# Patient Record
Sex: Male | Born: 1940 | Race: White | Hispanic: No | Marital: Married | Smoking: Never smoker
Health system: Southern US, Community
[De-identification: ages and names within clinical notes are randomized; demographics above are authoritative.]

## PROBLEM LIST (undated history)

## (undated) DIAGNOSIS — I5189 Other ill-defined heart diseases: Secondary | ICD-10-CM

## (undated) DIAGNOSIS — E785 Hyperlipidemia, unspecified: Secondary | ICD-10-CM

## (undated) DIAGNOSIS — I639 Cerebral infarction, unspecified: Secondary | ICD-10-CM

## (undated) DIAGNOSIS — R7303 Prediabetes: Secondary | ICD-10-CM

## (undated) DIAGNOSIS — I10 Essential (primary) hypertension: Secondary | ICD-10-CM

## (undated) HISTORY — PX: JOINT REPLACEMENT: SHX530

## (undated) HISTORY — DX: Hyperlipidemia, unspecified: E78.5

---

## 2010-07-27 ENCOUNTER — Ambulatory Visit: Payer: Self-pay | Admitting: Ophthalmology

## 2010-08-15 ENCOUNTER — Ambulatory Visit: Payer: Self-pay | Admitting: Ophthalmology

## 2010-10-14 ENCOUNTER — Ambulatory Visit: Payer: Self-pay | Admitting: Unknown Physician Specialty

## 2014-12-01 ENCOUNTER — Encounter: Payer: Self-pay | Admitting: Podiatry

## 2014-12-01 ENCOUNTER — Ambulatory Visit (INDEPENDENT_AMBULATORY_CARE_PROVIDER_SITE_OTHER): Payer: Medicare Other | Admitting: Podiatry

## 2014-12-01 VITALS — BP 148/78 | HR 49 | Resp 18

## 2014-12-01 DIAGNOSIS — L603 Nail dystrophy: Secondary | ICD-10-CM

## 2014-12-01 NOTE — Progress Notes (Signed)
   Subjective:    Patient ID: Alexander Meadowaymond E Eisenberger Jr., male    DOB: 01/16/1941, 74 y.o.   MRN: [id number]  HPI  74 year old male presents the office today with question his nails to be looked at to help prevent ingrowing toenail. He states his nails have started to grow a different shape any is concerned that he may start to get an ingrown toenail. He has a states that he is started to notice contractures of his toes. He states that his entire life he has had to wear a metatarsal pad and an orthotic. He denies any recent injury or trauma to bilateral lower extremity's. No other complaints at this time.  Review of Systems  All other systems reviewed and are negative.      Objective:   Physical Exam AAO 3, NAD DP/PT pulses palpable bilaterally, CRT less than 3 seconds Protective sensation intact with Simms Weinstein monofilament, vibratory sensation intact, Achilles tendon reflex intact. Nails are slightly hypertrophic, dystrophic, discolored. There is no swelling erythema or drainage. There is no significant ingrowing present at this time. There is no tenderness to palpation around the nails. No open lesions or pre-ulcerative lesions are identified. No areas of tenderness to bilateral lower extremities and no overlying edema, erythema, increase in warmth. There is hammertoe contractures identified. Prominent metatarsal heads plantarly with atrophy of the fat pad bilaterally. MMT 5/5, ROM WNL No pain with calf compression, swelling, warmth, erythema.       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 Patient does not believe that he has any nail fungus and he does not want to proceed with any biopsy at this time.  Discussed with him how to trim his nails appropriately to help prevent ingrown toenail. Also discussed shoe gear modifications. -In regards to the hammertoes dispensed various offloading devices to help. Discussed shoe gear changes. -Follow-up as needed. In the meantime, encouraged to call the office with any questions, concerns, changes symptoms.

## 2017-09-28 ENCOUNTER — Emergency Department: Payer: Medicare Other

## 2017-09-28 ENCOUNTER — Other Ambulatory Visit: Payer: Self-pay

## 2017-09-28 ENCOUNTER — Encounter: Payer: Self-pay | Admitting: Internal Medicine

## 2017-09-28 ENCOUNTER — Inpatient Hospital Stay
Admission: EM | Admit: 2017-09-28 | Discharge: 2017-10-02 | DRG: 470 | Disposition: A | Discharge status: Home Health | Payer: Medicare Other | Attending: Internal Medicine | Admitting: Internal Medicine

## 2017-09-28 DIAGNOSIS — R0902 Hypoxemia: Secondary | ICD-10-CM

## 2017-09-28 DIAGNOSIS — R7303 Prediabetes: Secondary | ICD-10-CM | POA: Diagnosis present

## 2017-09-28 DIAGNOSIS — E785 Hyperlipidemia, unspecified: Secondary | ICD-10-CM | POA: Diagnosis present

## 2017-09-28 DIAGNOSIS — W19XXXA Unspecified fall, initial encounter: Secondary | ICD-10-CM | POA: Diagnosis present

## 2017-09-28 DIAGNOSIS — Z882 Allergy status to sulfonamides status: Secondary | ICD-10-CM

## 2017-09-28 DIAGNOSIS — R739 Hyperglycemia, unspecified: Secondary | ICD-10-CM | POA: Diagnosis present

## 2017-09-28 DIAGNOSIS — S72011A Unspecified intracapsular fracture of right femur, initial encounter for closed fracture: Principal | ICD-10-CM | POA: Diagnosis present

## 2017-09-28 DIAGNOSIS — S72009A Fracture of unspecified part of neck of unspecified femur, initial encounter for closed fracture: Secondary | ICD-10-CM

## 2017-09-28 DIAGNOSIS — J9811 Atelectasis: Secondary | ICD-10-CM | POA: Diagnosis not present

## 2017-09-28 DIAGNOSIS — S61412A Laceration without foreign body of left hand, initial encounter: Secondary | ICD-10-CM | POA: Diagnosis present

## 2017-09-28 DIAGNOSIS — Y92008 Other place in unspecified non-institutional (private) residence as the place of occurrence of the external cause: Secondary | ICD-10-CM | POA: Diagnosis not present

## 2017-09-28 DIAGNOSIS — Z7982 Long term (current) use of aspirin: Secondary | ICD-10-CM

## 2017-09-28 DIAGNOSIS — I10 Essential (primary) hypertension: Secondary | ICD-10-CM | POA: Diagnosis present

## 2017-09-28 DIAGNOSIS — Z7984 Long term (current) use of oral hypoglycemic drugs: Secondary | ICD-10-CM

## 2017-09-28 DIAGNOSIS — Z96649 Presence of unspecified artificial hip joint: Secondary | ICD-10-CM

## 2017-09-28 DIAGNOSIS — R509 Fever, unspecified: Secondary | ICD-10-CM

## 2017-09-28 DIAGNOSIS — S72001A Fracture of unspecified part of neck of right femur, initial encounter for closed fracture: Secondary | ICD-10-CM

## 2017-09-28 DIAGNOSIS — D62 Acute posthemorrhagic anemia: Secondary | ICD-10-CM | POA: Diagnosis not present

## 2017-09-28 HISTORY — DX: Prediabetes: R73.03

## 2017-09-28 LAB — BASIC METABOLIC PANEL
Anion gap: 10 (ref 5–15)
BUN: 19 mg/dL (ref 6–20)
CALCIUM: 8.9 mg/dL (ref 8.9–10.3)
CHLORIDE: 107 mmol/L (ref 101–111)
CO2: 23 mmol/L (ref 22–32)
Creatinine, Ser: 1.27 mg/dL — ABNORMAL HIGH (ref 0.61–1.24)
GFR calc non Af Amer: 53 mL/min — ABNORMAL LOW (ref >60)
Glucose, Bld: 117 mg/dL — ABNORMAL HIGH (ref 65–99)
Potassium: 3.6 mmol/L (ref 3.5–5.1)
Sodium: 140 mmol/L (ref 135–145)

## 2017-09-28 LAB — PROTIME-INR
INR: 1.06
PROTHROMBIN TIME: 13.7 s (ref 11.4–15.2)

## 2017-09-28 LAB — GLUCOSE, CAPILLARY
GLUCOSE-CAPILLARY: 103 mg/dL — AB (ref 65–99)
Glucose-Capillary: 81 mg/dL (ref 65–99)

## 2017-09-28 LAB — SURGICAL PCR SCREEN
MRSA, PCR: NEGATIVE
STAPHYLOCOCCUS AUREUS: POSITIVE — AB

## 2017-09-28 LAB — CBC
HCT: 37.1 % — ABNORMAL LOW (ref 40.0–52.0)
Hemoglobin: 12.4 g/dL — ABNORMAL LOW (ref 13.0–18.0)
MCH: 30.9 pg (ref 26.0–34.0)
MCHC: 33.5 g/dL (ref 32.0–36.0)
MCV: 92.1 fL (ref 80.0–100.0)
PLATELETS: 200 10*3/uL (ref 150–440)
RBC: 4.03 MIL/uL — ABNORMAL LOW (ref 4.40–5.90)
RDW: 14.9 % — ABNORMAL HIGH (ref 11.5–14.5)
WBC: 10.3 10*3/uL (ref 3.8–10.6)

## 2017-09-28 MED ORDER — ONDANSETRON HCL 4 MG/2ML IJ SOLN
4.0000 mg | Freq: Once | INTRAMUSCULAR | Status: AC
  Administered 2017-09-28: 4 mg via INTRAVENOUS
  Filled 2017-09-28 (×2): qty 2

## 2017-09-28 MED ORDER — ATORVASTATIN CALCIUM 20 MG PO TABS
40.0000 mg | ORAL_TABLET | Freq: Every day | ORAL | Status: DC
  Administered 2017-09-29 – 2017-10-01 (×3): 40 mg via ORAL
  Filled 2017-09-28 (×4): qty 2

## 2017-09-28 MED ORDER — SODIUM CHLORIDE 0.9 % IV SOLN
INTRAVENOUS | Status: DC
  Administered 2017-09-28 – 2017-10-01 (×4): via INTRAVENOUS

## 2017-09-28 MED ORDER — ASPIRIN EC 81 MG PO TBEC: 81.0000 mg | DELAYED_RELEASE_TABLET | Freq: Two times a day (BID) | ORAL | Status: DC

## 2017-09-28 MED ORDER — MORPHINE SULFATE (PF) 2 MG/ML IV SOLN
2.0000 mg | INTRAVENOUS | Status: DC | PRN
  Administered 2017-09-29: 2 mg via INTRAVENOUS
  Filled 2017-09-28: qty 1

## 2017-09-28 MED ORDER — DOCUSATE SODIUM 100 MG PO CAPS: 100.0000 mg | ORAL_CAPSULE | Freq: Two times a day (BID) | ORAL | Status: DC | PRN

## 2017-09-28 MED ORDER — MORPHINE SULFATE (PF) 2 MG/ML IV SOLN
2.0000 mg | Freq: Once | INTRAVENOUS | Status: AC
  Administered 2017-09-28: 2 mg via INTRAVENOUS
  Filled 2017-09-28 (×2): qty 1

## 2017-09-28 MED ORDER — HEPARIN SODIUM (PORCINE) 5000 UNIT/ML IJ SOLN
5000.0000 [IU] | Freq: Three times a day (TID) | INTRAMUSCULAR | Status: DC
  Filled 2017-09-28: qty 1

## 2017-09-28 MED ORDER — CLINDAMYCIN PHOSPHATE 600 MG/50ML IV SOLN
600.0000 mg | INTRAVENOUS | Status: AC
  Administered 2017-09-29: 600 mg via INTRAVENOUS
  Filled 2017-09-28: qty 50

## 2017-09-28 MED ORDER — ACETAMINOPHEN 325 MG PO TABS
650.0000 mg | ORAL_TABLET | Freq: Four times a day (QID) | ORAL | Status: DC | PRN
  Administered 2017-09-28 – 2017-09-29 (×2): 650 mg via ORAL
  Filled 2017-09-28 (×2): qty 2

## 2017-09-28 MED ORDER — INSULIN ASPART 100 UNIT/ML ~~LOC~~ SOLN
0.0000 [IU] | Freq: Three times a day (TID) | SUBCUTANEOUS | Status: DC
  Administered 2017-09-29 – 2017-10-02 (×7): 1 [IU] via SUBCUTANEOUS
  Filled 2017-09-28 (×7): qty 1

## 2017-09-28 MED ORDER — CEFAZOLIN SODIUM-DEXTROSE 2-4 GM/100ML-% IV SOLN
2.0000 g | INTRAVENOUS | Status: DC
  Filled 2017-09-28 (×2): qty 100

## 2017-09-28 NOTE — ED Provider Notes (Signed)
[facility] Emergency Department Provider Note   ____________________________________________   First MD Initiated Contact with Patient 09/28/17 1302     (approximate)  I have reviewed the triage vital signs and the nursing notes.   HISTORY  Chief Complaint Fall    HPI Alexander MeadowRaymond E Crossman Jr. is a 76 y.o. male comes for evaluation of right hip pain after a fall  Patient reports that he was lifting his garage door when the cord on the garage door got stuck under his feet.  He caught him up and he fell directly onto the concrete floor with his right hip.  Reports he did not strike his head or injure his neck back chest or arms except for a small cut noted on the left hand.  He reports he was unable to get up due to pain in his right hip.  EMS was called and the noted significant tenderness around the right hip brought for further evaluation.  Denies any numbness tingling or weakness but reports he cannot move the right leg because of pain at the right hip  Takes only aspirin, no other blood thinner  No headache nausea vomiting or neck pain  Past Medical History:  Diagnosis Date  . Hyperlipidemia     There are no active problems to display for this patient.   HPI: Hypertension  Prior to Admission medications   Medication Sig Start Date End Date Taking? Authorizing Provider  aspirin EC 81 MG tablet Take 81 mg by mouth 2 (two) times daily.   Yes [provider]  ATORVASTATIN CALCIUM PO Take 1 tablet by mouth daily.   Yes [provider]  metFORMIN (GLUCOPHAGE) 500 MG tablet Take 500 mg by mouth daily with breakfast.   Yes [provider]    Allergies Sulfa antibiotics  No family history on file.  Social History Social History   Tobacco Use  . Smoking status: Never Smoker  . Smokeless tobacco: Never Used  Substance Use Topics  . Alcohol use: No    Alcohol/week: 0.0 oz  . Drug use: No    Review of  Systems Constitutional: No fever/chills Eyes: No visual changes. ENT: No sore throat. Cardiovascular: Denies chest pain. Respiratory: Denies shortness of breath. Gastrointestinal: No abdominal pain.  No nausea, no vomiting.  No diarrhea.  No constipation. Genitourinary: Negative for dysuria. Musculoskeletal: Negative for back pain.  See HPI regarding right hip pain denies any injury to the arms or left leg Skin: Negative for rash. Neurological: Negative for headaches, focal weakness or numbness.    ____________________________________________   PHYSICAL EXAM:  VITAL SIGNS: ED Triage Vitals  Enc Vitals Group     BP 09/28/17 1254 134/69     Pulse Rate 09/28/17 1254 72     Resp 09/28/17 1254 16     Temp 09/28/17 1254 99.1 F (37.3 C)     Temp Source 09/28/17 1254 Oral     SpO2 09/28/17 1254 97 %     Weight 09/28/17 1255 180 lb (81.6 kg)     Height 09/28/17 1255 5\' 11"  (1.803 m)     Head Circumference --      Peak Flow --      Pain Score 09/28/17 1253 1     Pain Loc --      Pain Edu? --      Excl. in GC? --     Constitutional: Alert and oriented. Well appearing and in no acute distress.  Very pleasant, here  with his daughter. Eyes: Conjunctivae are normal. Head: Atraumatic. Nose: No congestion/rhinnorhea. Mouth/Throat: Mucous membranes are moist. Neck: No stridor.   Cardiovascular: Normal rate, regular rhythm. Grossly normal heart sounds.  Good peripheral circulation. Respiratory: Normal respiratory effort.  No retractions. Lungs CTAB. Gastrointestinal: Soft and nontender. No distention. Musculoskeletal: Full range of motion the upper extremities without discomfort or pain at any joint.  No deformities.  A very small skin tear is noted over the left hand with bleeding controlled. Full range of motion left lower extremity without pain or discomfort.  Right lower extremity exam very limited due to focal tenderness about the right hip.  No tenderness around the right knee,  right knee, right ankle.  Dorsalis pedis pulses intact and normal bilateral.  Strong posterior tibial in the right leg as well. Neurologic:  Normal speech and language. No gross focal neurologic deficits are appreciated.  Skin:  Skin is warm, dry and intact. No rash noted. Psychiatric: Mood and affect are normal. Speech and behavior are normal.  ____________________________________________   LABS (all labs ordered are listed, but only abnormal results are displayed)  Labs Reviewed  CBC - Abnormal; Notable for the following components:      Result Value   RBC 4.03 (*)    Hemoglobin 12.4 (*)    HCT 37.1 (*)    RDW 14.9 (*)    All other components within normal limits  BASIC METABOLIC PANEL - Abnormal; Notable for the following components:   Glucose, Bld 117 (*)    Creatinine, Ser 1.27 (*)    GFR calc non Af Amer 53 (*)    All other components within normal limits  PROTIME-INR   ____________________________________________  EKG   ____________________________________________  RADIOLOGY  Dg Chest 1 View  Result Date: 09/28/2017 CLINICAL DATA:  Status post fall today. EXAM: CHEST 1 VIEW COMPARISON:  None. FINDINGS: Lungs are clear. Heart size is normal. No pneumothorax or pleural effusion. The patient is status post fixation of left scapular and left upper rib fractures. IMPRESSION: No acute abnormality. Electronically Signed   By: Drusilla Kanner M.D.   On: 09/28/2017 13:55   Dg Hip Unilat W Or Wo Pelvis 2-3 Views Right  Result Date: 09/28/2017 CLINICAL DATA:  Right hip pain.  Fall. EXAM: DG HIP (WITH OR WITHOUT PELVIS) 2-3V RIGHT COMPARISON:  No recent. FINDINGS: Angulated fracture of right femoral neck. Degenerative changes lumbar spine and both hips. Pelvic calcifications consistent with phleboliths. IMPRESSION: Angulated fracture of the right femoral neck. Electronically Signed   By: Maisie Fus  Register   On: 09/28/2017 13:53     Chest x-ray reviewed, no acute Right hip  reviewed, angulated fracture right femoral neck ____________________________________________   PROCEDURES  Procedure(s) performed: None  Procedures  Critical Care performed: No  ____________________________________________   INITIAL IMPRESSION / ASSESSMENT AND PLAN / ED COURSE  Pertinent labs & imaging results that were available during my care of the patient were reviewed by me and considered in my medical decision making (see chart for details).  Patient presents with fall.  Denies injury to the head neck chest abdomen pelvis, but does isolate right hip pain.  Alert and oriented.  No indication for head CT or cervical imaging.   Patient has obvious right proximal femur fracture/neck fracture.  ----------------------------------------- 2:43 PM on 09/28/2017 -----------------------------------------  Patient is been seen by Dr. Hyacinth Meeker.  Patient will be admitted to hospital service for ongoing management of right hip fracture.  Updated and discussed plan of admission and  orthopedic evaluation with patient and family who are in agreement.  Reports his pain is currently "1" out of 10, reports comfortable at this time.      ____________________________________________   FINAL CLINICAL IMPRESSION(S) / ED DIAGNOSES  Final diagnoses:  Closed right hip fracture, initial encounter (HCC)  Skin tear of left hand without complication, initial encounter      NEW MEDICATIONS STARTED DURING THIS VISIT:  This SmartLink is deprecated. Use AVSMEDLIST instead to display the medication list for a patient.   Note:  This document was prepared using Dragon voice recognition software and may include unintentional dictation errors.     Sharyn CreamerQuale, Mark, MD 09/28/17 1444

## 2017-09-28 NOTE — Progress Notes (Signed)
Pt arrived to unit. Skin assessment completed with [name], RN. SCDs applied. New IV bag hung and infusing. Pt on room air. MRSA swab completed. Family at bedside. Bed in lowest position and bed alarm on. Pt educated on call Adelsberger. Phone and items within reach. No complaints or questions at this time.

## 2017-09-28 NOTE — Anesthesia Preprocedure Evaluation (Addendum)
Anesthesia Evaluation  Patient identified by MRN, date of birth, ID band Patient awake    Reviewed: Allergy & Precautions, H&P , NPO status , Patient's Chart, lab work & pertinent test results, reviewed documented beta blocker date and time   History of Anesthesia Complications Negative for: history of anesthetic complications  Airway Mallampati: I  TM Distance: >3 FB Neck ROM: full    Dental  (+) Dental Advidsory Given, Caps, Teeth Intact   Pulmonary neg pulmonary ROS,           Cardiovascular Exercise Tolerance: Good negative cardio ROS       Neuro/Psych negative neurological ROS  negative psych ROS   GI/Hepatic negative GI ROS, Neg liver ROS,   Endo/Other  diabetes (Pre-diabetic)  Renal/GU negative Renal ROS  negative genitourinary   Musculoskeletal   Abdominal   Peds  Hematology negative hematology ROS (+)   Anesthesia Other Findings Past Medical History: No date: Hyperlipidemia   Reproductive/Obstetrics negative OB ROS                            Anesthesia Physical Anesthesia Plan  ASA: II  Anesthesia Plan: Spinal   Post-op Pain Management:    Induction: Intravenous  PONV Risk Score and Plan: 1 and Propofol infusion  Airway Management Planned: Simple Face Mask  Additional Equipment:   Intra-op Plan:   Post-operative Plan:   Informed Consent: I have reviewed the patients History and Physical, chart, labs and discussed the procedure including the risks, benefits and alternatives for the proposed anesthesia with the patient or authorized representative who has indicated his/her understanding and acceptance.   Dental Advisory Given  Plan Discussed with: Anesthesiologist, CRNA and Surgeon  Anesthesia Plan Comments:         Anesthesia Quick Evaluation

## 2017-09-28 NOTE — H&P (Signed)
[facility]   PATIENT NAME: Alexander Kline    MR#:  [id number]  DATE OF BIRTH:  05/21/1941  DATE OF ADMISSION:  09/28/2017  PRIMARY CARE PHYSICIAN: Sherron Mondayejan-Sie, S Ahmed, MD   REQUESTING/REFERRING PHYSICIAN: Quale   CHIEF COMPLAINT:   Chief Complaint  Patient presents with  . Fall    HISTORY OF PRESENT ILLNESS: Alexander Kline  is a 76 y.o. 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 Today , while pulling the garage door open- had a string attached to it, and it got tangled in his leg and he fell- his right hip hit the concrete floor. In ER noted to have right hip- femoral neck fracture. Ortho planning for surgery tomorrow.  PAST MEDICAL HISTORY:   Past Medical History:  Diagnosis Date  . Hyperlipidemia     PAST SURGICAL HISTORY: None. SOCIAL HISTORY:  Social History   Tobacco Use  . Smoking status: Never Smoker  . Smokeless tobacco: Never Used  Substance Use Topics  . Alcohol use: No    Alcohol/week: 0.0 oz    FAMILY HISTORY:  Family History  Problem Relation Age of Onset  . CAD Father   . CAD Brother     DRUG ALLERGIES:  Allergies  Allergen Reactions  . Sulfa Antibiotics     Per pt, he gets bad headaches with sulfa drugs    REVIEW OF SYSTEMS:   CONSTITUTIONAL: No fever, fatigue or weakness.  EYES: No blurred or double vision.  EARS, NOSE, AND THROAT: No tinnitus or ear pain.  RESPIRATORY: No cough, shortness of breath, wheezing or hemoptysis.  CARDIOVASCULAR: No chest pain, orthopnea, edema.  GASTROINTESTINAL: No nausea, vomiting, diarrhea or abdominal pain.  GENITOURINARY: No dysuria, hematuria.  ENDOCRINE: No polyuria, nocturia,  HEMATOLOGY: No anemia, easy bruising or bleeding SKIN: No rash or lesion. MUSCULOSKELETAL: right hip pain.   NEUROLOGIC: No tingling, numbness, weakness.  PSYCHIATRY: No  anxiety or depression.   MEDICATIONS AT HOME:  Prior to Admission medications   Medication Sig Start Date End Date Taking? Authorizing Provider  aspirin EC 81 MG tablet Take 81 mg by mouth 2 (two) times daily.   Yes [provider]  ATORVASTATIN CALCIUM PO Take 1 tablet by mouth daily.   Yes [provider]  metFORMIN (GLUCOPHAGE) 500 MG tablet Take 500 mg by mouth daily with breakfast.   Yes [provider]      PHYSICAL EXAMINATION:   VITAL SIGNS: Blood pressure 134/69, pulse 72, temperature 99.1 F (37.3 C), temperature source Oral, resp. rate 16, height 5\' 11"  (1.803 m), weight 81.6 kg (180 lb), SpO2 97 %.  GENERAL:  76 y.o.-year-old patient lying in the bed with no acute distress.  EYES: Pupils equal, round, reactive to light and accommodation. No scleral icterus. Extraocular muscles intact.  HEENT: Head atraumatic, normocephalic. Oropharynx and nasopharynx clear.  NECK:  Supple, no jugular venous distention. No thyroid enlargement, no tenderness.  LUNGS: Normal breath sounds bilaterally, no wheezing, rales,rhonchi or crepitation. No use of accessory muscles of respiration.  CARDIOVASCULAR: S1, S2 normal. No murmurs, rubs, or gallops.  ABDOMEN: Soft, nontender, nondistended. Bowel sounds present. No organomegaly or mass.  EXTREMITIES: No pedal edema, cyanosis, or clubbing. dorsalis pedis pulse is palpable on right. NEUROLOGIC: Cranial nerves II through XII are intact. Muscle strength 5/5 in all extremities- except pain in right LL. Sensation intact.  Gait not checked.  PSYCHIATRIC: The patient is alert and oriented x 3.  SKIN: No obvious rash, lesion, or ulcer.   LABORATORY PANEL:   CBC Recent Labs  Lab 09/28/17 1251  WBC 10.3  HGB 12.4*  HCT 37.1*  PLT 200  MCV 92.1  MCH 30.9  MCHC 33.5  RDW 14.9*   ------------------------------------------------------------------------------------------------------------------  Chemistries  Recent Labs   Lab 09/28/17 1251  NA 140  K 3.6  CL 107  CO2 23  GLUCOSE 117*  BUN 19  CREATININE 1.27*  CALCIUM 8.9   ------------------------------------------------------------------------------------------------------------------ estimated creatinine clearance is 51.9 mL/min (A) (by C-G formula based on SCr of 1.27 mg/dL (H)). ------------------------------------------------------------------------------------------------------------------ No results for input(s): TSH, T4TOTAL, T3FREE, THYROIDAB in the last 72 hours.  Invalid input(s): FREET3   Coagulation profile Recent Labs  Lab 09/28/17 1251  INR 1.06   ------------------------------------------------------------------------------------------------------------------- No results for input(s): DDIMER in the last 72 hours. -------------------------------------------------------------------------------------------------------------------  Cardiac Enzymes No results for input(s): CKMB, TROPONINI, MYOGLOBIN in the last 168 hours.  Invalid input(s): CK ------------------------------------------------------------------------------------------------------------------ Invalid input(s): POCBNP  ---------------------------------------------------------------------------------------------------------------  Urinalysis No results found for: COLORURINE, APPEARANCEUR, LABSPEC, PHURINE, GLUCOSEU, HGBUR, BILIRUBINUR, KETONESUR, PROTEINUR, UROBILINOGEN, NITRITE, LEUKOCYTESUR   RADIOLOGY: Dg Chest 1 View  Result Date: 09/28/2017 CLINICAL DATA:  Status post fall today. EXAM: CHEST 1 VIEW COMPARISON:  None. FINDINGS: Lungs are clear. Heart size is normal. No pneumothorax or pleural effusion. The patient is status post fixation of left scapular and left upper rib fractures. IMPRESSION: No acute abnormality. Electronically Signed   By: Drusilla Kannerhomas  Dalessio M.D.   On: 09/28/2017 13:55   Dg Hip Unilat W Or Wo Pelvis 2-3 Views Right  Result Date:  09/28/2017 CLINICAL DATA:  Right hip pain.  Fall. EXAM: DG HIP (WITH OR WITHOUT PELVIS) 2-3V RIGHT COMPARISON:  No recent. FINDINGS: Angulated fracture of right femoral neck. Degenerative changes lumbar spine and both hips. Pelvic calcifications consistent with phleboliths. IMPRESSION: Angulated fracture of the right femoral neck. Electronically Signed   By: Maisie Fushomas  Register   On: 09/28/2017 13:53    EKG: No orders found for this or any previous visit.  IMPRESSION AND PLAN:  * Right hip fracture- femoral neck closed fracture.   Pain management and DVT prophylaxis for tonight, then as per ortho.   Plan for surgery tomorrow.   He has very good baseline status- walks 15-20 min fast- 3-4 times a week. No complains for chest pain. Does not need any further cardiac meds, we can proceed with surgery as planned.  * Hyperlipidemia   Cont atorvastatin.  * Borderline hyperglycemia   Stop metformin due to inpatient and surgery and keep on ISS.  All the records are reviewed and case discussed with ED provider. Management plans discussed with the patient, family and they are in agreement.  CODE STATUS: Full. Code Status History    This patient does not have a recorded code status. Please follow your organizational policy for patients in this situation.     His daughter is present in room.  TOTAL TIME TAKING CARE OF THIS PATIENT: 50 minutes.    Altamese DillingVaibhavkumar Jove Beyl M.D on 09/28/2017   Between 7am to 6pm - Pager - [phone]  After 6pm go to [email or web address] - password [email or web address]  [facility]  Office  [phone]  CC: Primary care physician; Sherron Mondayejan-Sie, S Ahmed, MD   Note: This dictation was prepared with Dragon dictation along with smaller phrase technology. Any transcriptional errors that result from this process are unintentional.

## 2017-09-28 NOTE — Consult Note (Signed)
  ORTHOPAEDIC CONSULTATION  REQUESTING PHYSICIAN: Altamese DillingVachhani, Vaibhavkumar, *  Chief Complaint:    HPI: Alexander MeadowRaymond E Trickett Jr. is a 76 y.o. male who complains of     Past Medical History:  Diagnosis Date  . Hyperlipidemia   . Pre-diabetes    History reviewed. No pertinent surgical history. Social History   Socioeconomic History  . Marital status: Married    Spouse name: None  . Number of children: None  . Years of education: None  . Highest education level: None  Social Needs  . Financial resource strain: None  . Food insecurity - worry: None  . Food insecurity - inability: None  . Transportation needs - medical: None  . Transportation needs - non-medical: None  Occupational History  . None  Tobacco Use  . Smoking status: Never Smoker  . Smokeless tobacco: Never Used  Substance and Sexual Activity  . Alcohol use: No    Alcohol/week: 0.0 oz  . Drug use: No  . Sexual activity: Not Currently  Other Topics Concern  . None  Social History Narrative  . None   Family History  Problem Relation Age of Onset  . CAD Father   . CAD Brother    Allergies  Allergen Reactions  . Sulfa Antibiotics     Per pt, he gets bad headaches with sulfa drugs   Prior to Admission medications   Medication Sig Start Date End Date Taking? Authorizing Provider  aspirin EC 81 MG tablet Take 81 mg by mouth 2 (two) times daily.   Yes [provider]  ATORVASTATIN CALCIUM PO Take 1 tablet by mouth daily.   Yes [provider]  metFORMIN (GLUCOPHAGE) 500 MG tablet Take 500 mg by mouth daily with breakfast.   Yes [provider]   Dg Chest 1 View  Result Date: 09/28/2017 CLINICAL DATA:  Status post fall today. EXAM: CHEST 1 VIEW COMPARISON:  None. FINDINGS: Lungs are clear. Heart size is normal. No pneumothorax or pleural effusion. The patient is status post fixation of left scapular and left upper rib fractures. IMPRESSION: No acute abnormality. Electronically Signed    By: Drusilla Kannerhomas  Dalessio M.D.   On: 09/28/2017 13:55   Dg Hip Unilat W Or Wo Pelvis 2-3 Views Right  Result Date: 09/28/2017 CLINICAL DATA:  Right hip pain.  Fall. EXAM: DG HIP (WITH OR WITHOUT PELVIS) 2-3V RIGHT COMPARISON:  No recent. FINDINGS: Angulated fracture of right femoral neck. Degenerative changes lumbar spine and both hips. Pelvic calcifications consistent with phleboliths. IMPRESSION: Angulated fracture of the right femoral neck. Electronically Signed   By: Maisie Fushomas  Register   On: 09/28/2017 13:53    Positive ROS: All other systems have been reviewed and were otherwise negative with the exception of those mentioned in the HPI and as above.  Physical Exam: General: Alert, no acute distress Cardiovascular: No pedal edema Respiratory: No cyanosis, no use of accessory musculature GI: No organomegaly, abdomen is soft and non-tender Skin: No lesions in the area of chief complaint Neurologic: Sensation intact distally Psychiatric: Patient is competent for consent with normal mood and affect Lymphatic: No axillary or cervical lymphadenopathy  MUSCULOSKELETAL:    Assessment:    Plan:       Valinda HoarMILLER,Loy Little E, MD [phone]   09/28/2017 8:17 PM

## 2017-09-28 NOTE — ED Triage Notes (Signed)
Pt arrives via [facility] after a mechanical fall trying to lift his garage door roughly 30 minutes ago. Pt states he is having little to no pain unless he moves. Pt taking 81mg  aspirin daily but denies other anticoagulants. EMS placed sheet binder around hips.

## 2017-09-28 NOTE — Consult Note (Signed)
ORTHOPAEDIC CONSULTATION  REQUESTING PHYSICIAN: Altamese DillingVachhani, Vaibhavkumar, *  Chief Complaint: Right hip pain  HPI: Alexander MeadowRaymond Kline Rawlins Jr. is a 76 y.o. male who complains of  Right hip pain after an accident today. He was lifting his garage door when the rope broke and the door drove him to the ground, injuring the right hip. Exam and XR in the ER shows a displaced subcapital fx of the right hip.  I have recommended hemiarthroplasty for this injury to the patient and family who agree with this plan.  Risks and benefits of surgery vs. Non operative treatment discussed.  Plan surgery tomorrow AM,.  Cleared by medicine for surgery  Past Medical History:  Diagnosis Date  . Hyperlipidemia   . Pre-diabetes    History reviewed. No pertinent surgical history. Social History   Socioeconomic History  . Marital status: Married    Spouse name: None  . Number of children: None  . Years of education: None  . Highest education level: None  Social Needs  . Financial resource strain: None  . Food insecurity - worry: None  . Food insecurity - inability: None  . Transportation needs - medical: None  . Transportation needs - non-medical: None  Occupational History  . None  Tobacco Use  . Smoking status: Never Smoker  . Smokeless tobacco: Never Used  Substance and Sexual Activity  . Alcohol use: No    Alcohol/week: 0.0 oz  . Drug use: No  . Sexual activity: Not Currently  Other Topics Concern  . None  Social History Narrative  . None   Family History  Problem Relation Age of Onset  . CAD Father   . CAD Brother    Allergies  Allergen Reactions  . Sulfa Antibiotics     Per pt, he gets bad headaches with sulfa drugs   Prior to Admission medications   Medication Sig Start Date End Date Taking? Authorizing Provider  aspirin EC 81 MG tablet Take 81 mg by mouth 2 (two) times daily.   Yes [provider]  ATORVASTATIN CALCIUM PO Take 1 tablet by mouth daily.   Yes [provider]  metFORMIN (GLUCOPHAGE) 500 MG tablet Take 500 mg by mouth daily with breakfast.   Yes [provider]   Dg Chest 1 View  Result Date: 09/28/2017 CLINICAL DATA:  Status post fall today. EXAM: CHEST 1 VIEW COMPARISON:  None. FINDINGS: Lungs are clear. Heart size is normal. No pneumothorax or pleural effusion. The patient is status post fixation of left scapular and left upper rib fractures. IMPRESSION: No acute abnormality. Electronically Signed   By: Drusilla Kannerhomas  Dalessio M.D.   On: 09/28/2017 13:55   Dg Hip Unilat W Or Wo Pelvis 2-3 Views Right  Result Date: 09/28/2017 CLINICAL DATA:  Right hip pain.  Fall. EXAM: DG HIP (WITH OR WITHOUT PELVIS) 2-3V RIGHT COMPARISON:  No recent. FINDINGS: Angulated fracture of right femoral neck. Degenerative changes lumbar spine and both hips. Pelvic calcifications consistent with phleboliths. IMPRESSION: Angulated fracture of the right femoral neck. Electronically Signed   By: Maisie Fushomas  Register   On: 09/28/2017 13:53    Positive ROS: All other systems have been reviewed and were otherwise negative with the exception of those mentioned in the HPI and as above.  Physical Exam: General: Alert, no acute distress Cardiovascular: No pedal edema Respiratory: No cyanosis, no use of accessory musculature GI: No organomegaly, abdomen is soft and non-tender Skin: No lesions in the area of chief complaint Neurologic: Sensation  intact distally Psychiatric: Patient is competent for consent with normal mood and affect Lymphatic: No axillary or cervical lymphadenopathy  MUSCULOSKELETAL: right leg short and rotated.  Pain with rotation of hip.  Skin and csm intact.  No other injuries noted.  Assessment: Displaced subcapital fx right hip  Plan: Right hip hemiarthroplasty    Alexander Kline,Alexander Hoar E, MD [phone]   09/28/2017 8:50 PM

## 2017-09-29 ENCOUNTER — Inpatient Hospital Stay: Payer: Medicare Other | Admitting: Anesthesiology

## 2017-09-29 ENCOUNTER — Encounter: Payer: Self-pay | Admitting: Anesthesiology

## 2017-09-29 ENCOUNTER — Inpatient Hospital Stay: Payer: Medicare Other

## 2017-09-29 ENCOUNTER — Encounter
Admission: EM | Disposition: A | Discharge status: Home Health | Payer: Self-pay | Source: Home / Self Care | Attending: Internal Medicine

## 2017-09-29 HISTORY — PX: HIP ARTHROPLASTY: SHX981

## 2017-09-29 LAB — BASIC METABOLIC PANEL
Anion gap: 6 (ref 5–15)
BUN: 23 mg/dL — AB (ref 6–20)
CALCIUM: 8.2 mg/dL — AB (ref 8.9–10.3)
CO2: 24 mmol/L (ref 22–32)
CREATININE: 1.36 mg/dL — AB (ref 0.61–1.24)
Chloride: 109 mmol/L (ref 101–111)
GFR calc Af Amer: 56 mL/min — ABNORMAL LOW (ref >60)
GFR, EST NON AFRICAN AMERICAN: 49 mL/min — AB (ref >60)
GLUCOSE: 117 mg/dL — AB (ref 65–99)
POTASSIUM: 4 mmol/L (ref 3.5–5.1)
SODIUM: 139 mmol/L (ref 135–145)

## 2017-09-29 LAB — GLUCOSE, CAPILLARY
GLUCOSE-CAPILLARY: 94 mg/dL (ref 65–99)
GLUCOSE-CAPILLARY: 134 mg/dL — AB (ref 65–99)
Glucose-Capillary: 97 mg/dL (ref 65–99)
Glucose-Capillary: 121 mg/dL — ABNORMAL HIGH (ref 65–99)

## 2017-09-29 LAB — CBC
HEMATOCRIT: 36.9 % — AB (ref 40.0–52.0)
Hemoglobin: 12.3 g/dL — ABNORMAL LOW (ref 13.0–18.0)
MCH: 30.6 pg (ref 26.0–34.0)
MCHC: 33.3 g/dL (ref 32.0–36.0)
MCV: 92 fL (ref 80.0–100.0)
PLATELETS: 166 10*3/uL (ref 150–440)
RBC: 4.01 MIL/uL — ABNORMAL LOW (ref 4.40–5.90)
RDW: 15.1 % — AB (ref 11.5–14.5)
WBC: 14.5 10*3/uL — AB (ref 3.8–10.6)

## 2017-09-29 LAB — URINALYSIS, COMPLETE (UACMP) WITH MICROSCOPIC
BACTERIA UA: NONE SEEN
BILIRUBIN URINE: NEGATIVE
GLUCOSE, UA: NEGATIVE mg/dL
Hgb urine dipstick: NEGATIVE
Ketones, ur: NEGATIVE mg/dL
Leukocytes, UA: NEGATIVE
NITRITE: NEGATIVE
PH: 5 (ref 5.0–8.0)
Protein, ur: NEGATIVE mg/dL
RBC / HPF: NONE SEEN RBC/hpf (ref 0–5)
SPECIFIC GRAVITY, URINE: 1.011 (ref 1.005–1.030)
Squamous Epithelial / LPF: NONE SEEN

## 2017-09-29 SURGERY — HEMIARTHROPLASTY, HIP, DIRECT ANTERIOR APPROACH, FOR FRACTURE
Anesthesia: Spinal | Laterality: Right

## 2017-09-29 MED ORDER — FENTANYL CITRATE (PF) 100 MCG/2ML IJ SOLN: 25.0000 ug | INTRAMUSCULAR | Status: DC | PRN

## 2017-09-29 MED ORDER — ZOLPIDEM TARTRATE 5 MG PO TABS: 5.0000 mg | ORAL_TABLET | Freq: Every evening | ORAL | Status: DC | PRN

## 2017-09-29 MED ORDER — MIDAZOLAM HCL 2 MG/2ML IJ SOLN
INTRAMUSCULAR | Status: AC
  Filled 2017-09-29: qty 2

## 2017-09-29 MED ORDER — PROPOFOL 500 MG/50ML IV EMUL
INTRAVENOUS | Status: DC | PRN
  Administered 2017-09-29: 35 ug/kg/min via INTRAVENOUS

## 2017-09-29 MED ORDER — SENNA 8.6 MG PO TABS
1.0000 | ORAL_TABLET | Freq: Two times a day (BID) | ORAL | Status: DC
  Administered 2017-09-29 – 2017-10-02 (×6): 8.6 mg via ORAL
  Filled 2017-09-29 (×7): qty 1

## 2017-09-29 MED ORDER — PHENYLEPHRINE HCL 10 MG/ML IJ SOLN
INTRAMUSCULAR | Status: DC | PRN
  Administered 2017-09-29 (×3): 100 ug via INTRAVENOUS

## 2017-09-29 MED ORDER — ONDANSETRON HCL 4 MG PO TABS: 4.0000 mg | ORAL_TABLET | Freq: Four times a day (QID) | ORAL | Status: DC | PRN

## 2017-09-29 MED ORDER — ACETAMINOPHEN 325 MG PO TABS
650.0000 mg | ORAL_TABLET | Freq: Four times a day (QID) | ORAL | Status: DC | PRN
  Administered 2017-09-30: 650 mg via ORAL
  Filled 2017-09-29: qty 2

## 2017-09-29 MED ORDER — CLINDAMYCIN PHOSPHATE 600 MG/50ML IV SOLN
600.0000 mg | Freq: Three times a day (TID) | INTRAVENOUS | Status: AC
  Administered 2017-09-29 – 2017-09-30 (×3): 600 mg via INTRAVENOUS
  Filled 2017-09-29 (×4): qty 50

## 2017-09-29 MED ORDER — METOCLOPRAMIDE HCL 10 MG PO TABS: 5.0000 mg | ORAL_TABLET | Freq: Three times a day (TID) | ORAL | Status: DC | PRN

## 2017-09-29 MED ORDER — MIDAZOLAM HCL 5 MG/5ML IJ SOLN
INTRAMUSCULAR | Status: DC | PRN
  Administered 2017-09-29: 1 mg via INTRAVENOUS

## 2017-09-29 MED ORDER — FLEET ENEMA 7-19 GM/118ML RE ENEM: 1.0000 | ENEMA | Freq: Once | RECTAL | Status: DC | PRN

## 2017-09-29 MED ORDER — METHOCARBAMOL 500 MG PO TABS
500.0000 mg | ORAL_TABLET | Freq: Four times a day (QID) | ORAL | Status: DC | PRN
  Filled 2017-09-29: qty 1

## 2017-09-29 MED ORDER — CEFAZOLIN SODIUM-DEXTROSE 2-4 GM/100ML-% IV SOLN
2.0000 g | Freq: Three times a day (TID) | INTRAVENOUS | Status: DC
  Filled 2017-09-29: qty 100

## 2017-09-29 MED ORDER — BUPIVACAINE HCL (PF) 0.5 % IJ SOLN
INTRAMUSCULAR | Status: DC | PRN
  Administered 2017-09-29: 3 mL

## 2017-09-29 MED ORDER — SODIUM CHLORIDE 0.9 % IJ SOLN
INTRAMUSCULAR | Status: AC
  Filled 2017-09-29: qty 50

## 2017-09-29 MED ORDER — METHOCARBAMOL 1000 MG/10ML IJ SOLN
500.0000 mg | Freq: Four times a day (QID) | INTRAVENOUS | Status: DC | PRN
  Filled 2017-09-29: qty 5

## 2017-09-29 MED ORDER — CEFAZOLIN SODIUM-DEXTROSE 1-4 GM/50ML-% IV SOLN
INTRAVENOUS | Status: DC | PRN
  Administered 2017-09-29: 2 g via INTRAVENOUS

## 2017-09-29 MED ORDER — BUPIVACAINE LIPOSOME 1.3 % IJ SUSP
INTRAMUSCULAR | Status: AC
  Filled 2017-09-29: qty 20

## 2017-09-29 MED ORDER — ACETAMINOPHEN 650 MG RE SUPP: 650.0000 mg | Freq: Four times a day (QID) | RECTAL | Status: DC | PRN

## 2017-09-29 MED ORDER — ONDANSETRON HCL 4 MG/2ML IJ SOLN: 4.0000 mg | Freq: Four times a day (QID) | INTRAMUSCULAR | Status: DC | PRN

## 2017-09-29 MED ORDER — NEOMYCIN-POLYMYXIN B GU 40-200000 IR SOLN
Status: AC
  Filled 2017-09-29: qty 8

## 2017-09-29 MED ORDER — PROPOFOL 10 MG/ML IV BOLUS
INTRAVENOUS | Status: DC | PRN
  Administered 2017-09-29: 20 mg via INTRAVENOUS

## 2017-09-29 MED ORDER — ONDANSETRON HCL 4 MG/2ML IJ SOLN: 4.0000 mg | Freq: Once | INTRAMUSCULAR | Status: DC | PRN

## 2017-09-29 MED ORDER — BUPIVACAINE-EPINEPHRINE (PF) 0.25% -1:200000 IJ SOLN
INTRAMUSCULAR | Status: DC | PRN
  Administered 2017-09-29: 30 mL

## 2017-09-29 MED ORDER — MENTHOL 3 MG MT LOZG
1.0000 | LOZENGE | OROMUCOSAL | Status: DC | PRN
  Filled 2017-09-29: qty 9

## 2017-09-29 MED ORDER — TRANEXAMIC ACID 1000 MG/10ML IV SOLN
INTRAVENOUS | Status: AC
  Filled 2017-09-29: qty 10

## 2017-09-29 MED ORDER — SODIUM CHLORIDE 0.45 % IV SOLN
INTRAVENOUS | Status: DC
  Administered 2017-09-29: 15:00:00 via INTRAVENOUS

## 2017-09-29 MED ORDER — BUPIVACAINE-EPINEPHRINE (PF) 0.25% -1:200000 IJ SOLN
INTRAMUSCULAR | Status: AC
  Filled 2017-09-29: qty 30

## 2017-09-29 MED ORDER — PROPOFOL 10 MG/ML IV BOLUS
INTRAVENOUS | Status: AC
  Filled 2017-09-29: qty 20

## 2017-09-29 MED ORDER — DEXTROSE 5 % IV SOLN
2.0000 g | Freq: Three times a day (TID) | INTRAVENOUS | Status: AC
  Administered 2017-09-29 – 2017-09-30 (×3): 2 g via INTRAVENOUS
  Filled 2017-09-29 (×3): qty 2000

## 2017-09-29 MED ORDER — SODIUM CHLORIDE 0.9 % IV SOLN
INTRAVENOUS | Status: DC | PRN
  Administered 2017-09-29: 25 ug/min via INTRAVENOUS

## 2017-09-29 MED ORDER — HYDROCODONE-ACETAMINOPHEN 5-325 MG PO TABS
1.0000 | ORAL_TABLET | Freq: Four times a day (QID) | ORAL | Status: DC | PRN
  Administered 2017-09-29 – 2017-09-30 (×3): 2 via ORAL
  Administered 2017-10-01 – 2017-10-02 (×2): 1 via ORAL
  Filled 2017-09-29: qty 1
  Filled 2017-09-29 (×2): qty 2
  Filled 2017-09-29 (×3): qty 1
  Filled 2017-09-29: qty 2

## 2017-09-29 MED ORDER — FERROUS SULFATE 325 (65 FE) MG PO TABS
325.0000 mg | ORAL_TABLET | Freq: Every day | ORAL | Status: DC
  Administered 2017-09-30 – 2017-10-02 (×3): 325 mg via ORAL
  Filled 2017-09-29 (×3): qty 1

## 2017-09-29 MED ORDER — ALUM & MAG HYDROXIDE-SIMETH 200-200-20 MG/5ML PO SUSP: 30.0000 mL | ORAL | Status: DC | PRN

## 2017-09-29 MED ORDER — KETAMINE HCL 10 MG/ML IJ SOLN
INTRAMUSCULAR | Status: DC | PRN
  Administered 2017-09-29: 50 mg via INTRAVENOUS

## 2017-09-29 MED ORDER — ENOXAPARIN SODIUM 40 MG/0.4ML ~~LOC~~ SOLN
40.0000 mg | SUBCUTANEOUS | Status: DC
  Administered 2017-09-30 – 2017-10-02 (×3): 40 mg via SUBCUTANEOUS
  Filled 2017-09-29 (×3): qty 0.4

## 2017-09-29 MED ORDER — MORPHINE SULFATE (PF) 2 MG/ML IV SOLN
0.5000 mg | INTRAVENOUS | Status: DC | PRN
  Administered 2017-09-30: 0.5 mg via INTRAVENOUS
  Filled 2017-09-29: qty 1

## 2017-09-29 MED ORDER — PROPOFOL 500 MG/50ML IV EMUL
INTRAVENOUS | Status: AC
  Filled 2017-09-29: qty 50

## 2017-09-29 MED ORDER — NEOMYCIN-POLYMYXIN B GU 40-200000 IR SOLN
Status: DC | PRN
  Administered 2017-09-29: 8 mL

## 2017-09-29 MED ORDER — PHENOL 1.4 % MT LIQD
1.0000 | OROMUCOSAL | Status: DC | PRN
  Filled 2017-09-29: qty 177

## 2017-09-29 MED ORDER — METOCLOPRAMIDE HCL 5 MG/ML IJ SOLN: 5.0000 mg | Freq: Three times a day (TID) | INTRAMUSCULAR | Status: DC | PRN

## 2017-09-29 MED ORDER — BISACODYL 10 MG RE SUPP
10.0000 mg | Freq: Every day | RECTAL | Status: DC | PRN
  Administered 2017-10-01: 10 mg via RECTAL
  Filled 2017-09-29: qty 1

## 2017-09-29 MED ORDER — ENOXAPARIN SODIUM 30 MG/0.3ML ~~LOC~~ SOLN: 30.0000 mg | SUBCUTANEOUS | Status: DC

## 2017-09-29 SURGICAL SUPPLY — 54 items
BAG COUNTER SPONGE EZ (MISCELLANEOUS) IMPLANT
BLADE DEBAKEY 8.0 (BLADE) ×2 IMPLANT
BLADE DEBAKEY 8.0MM (BLADE) ×1
BLADE SAGITTAL WIDE XTHICK NO (BLADE) ×3 IMPLANT
BLADE SURG SZ10 CARB STEEL (BLADE) ×3 IMPLANT
CANISTER SUCT 1200ML W/VALVE (MISCELLANEOUS) ×6 IMPLANT
CAPT HIP HEMI 2 ×3 IMPLANT
CHLORAPREP W/TINT 26ML (MISCELLANEOUS) ×6 IMPLANT
COUNTER SPONGE BAG EZ (MISCELLANEOUS)
DRAPE INCISE IOBAN 66X60 STRL (DRAPES) ×6 IMPLANT
DRAPE TABLE BACK 80X90 (DRAPES) ×3 IMPLANT
DRSG AQUACEL AG ADV 3.5X10 (GAUZE/BANDAGES/DRESSINGS) IMPLANT
DRSG AQUACEL AG ADV 3.5X14 (GAUZE/BANDAGES/DRESSINGS) ×3 IMPLANT
ELECT BLADE 6.5 EXT (BLADE) ×3 IMPLANT
ELECT CAUTERY BLADE 6.4 (BLADE) ×3 IMPLANT
ELECT REM PT RETURN 9FT ADLT (ELECTROSURGICAL) ×3
ELECTRODE REM PT RTRN 9FT ADLT (ELECTROSURGICAL) ×1 IMPLANT
EVACUATOR 1/8 PVC DRAIN (DRAIN) IMPLANT
GAUZE PETRO XEROFOAM 1X8 (MISCELLANEOUS) ×6 IMPLANT
GAUZE SPONGE 4X4 12PLY STRL (GAUZE/BANDAGES/DRESSINGS) ×3 IMPLANT
GAUZE XEROFORM 4X4 STRL (GAUZE/BANDAGES/DRESSINGS) ×3 IMPLANT
GLOVE INDICATOR 8.0 STRL GRN (GLOVE) ×3 IMPLANT
GLOVE SURG ORTHO 8.5 STRL (GLOVE) ×12 IMPLANT
GOWN STRL REUS W/ TWL LRG LVL3 (GOWN DISPOSABLE) ×1 IMPLANT
GOWN STRL REUS W/TWL LRG LVL3 (GOWN DISPOSABLE) ×2
GOWN STRL REUS W/TWL LRG LVL4 (GOWN DISPOSABLE) ×3 IMPLANT
HIP CAPITATED HEMI 2 ×1 IMPLANT
IV NS 1000ML (IV SOLUTION) ×2
IV NS 1000ML BAXH (IV SOLUTION) ×1 IMPLANT
KIT RM TURNOVER STRD PROC AR (KITS) ×3 IMPLANT
NEEDLE FILTER BLUNT 18X 1/2SAF (NEEDLE) ×2
NEEDLE FILTER BLUNT 18X1 1/2 (NEEDLE) ×1 IMPLANT
NEEDLE MAYO CATGUT SZ4 (NEEDLE) ×3 IMPLANT
NEEDLE SPNL 18GX3.5 QUINCKE PK (NEEDLE) ×6 IMPLANT
NS IRRIG 1000ML POUR BTL (IV SOLUTION) ×3 IMPLANT
PACK HIP PROSTHESIS (MISCELLANEOUS) ×3 IMPLANT
PAD ABD DERMACEA PRESS 5X9 (GAUZE/BANDAGES/DRESSINGS) ×6 IMPLANT
PULSAVAC PLUS IRRIG FAN TIP (DISPOSABLE) ×3
SOL PREP PVP 2OZ (MISCELLANEOUS) ×3
SOLUTION PREP PVP 2OZ (MISCELLANEOUS) ×1 IMPLANT
STAPLER SKIN PROX 35W (STAPLE) ×3 IMPLANT
SUT DVC 2 QUILL PDO  T11 36X36 (SUTURE) ×4
SUT DVC 2 QUILL PDO T11 36X36 (SUTURE) ×2 IMPLANT
SUT QUILL PDO 0 36 36 VIOLET (SUTURE) ×3 IMPLANT
SUT TICRON 2-0 30IN 311381 (SUTURE) ×15 IMPLANT
SYR 10ML LL (SYRINGE) ×3 IMPLANT
SYR 30ML LL (SYRINGE) ×3 IMPLANT
SYR 50ML LL SCALE MARK (SYRINGE) IMPLANT
TAPE MICROFOAM 4IN (TAPE) IMPLANT
TIP BRUSH PULSAVAC PLUS 24.33 (MISCELLANEOUS) ×3 IMPLANT
TIP FAN IRRIG PULSAVAC PLUS (DISPOSABLE) ×1 IMPLANT
TRAY FOLEY W/METER SILVER 16FR (SET/KITS/TRAYS/PACK) ×3 IMPLANT
TUBE SUCT KAM VAC (TUBING) ×3 IMPLANT
WATER STERILE IRR 1000ML POUR (IV SOLUTION) ×3 IMPLANT

## 2017-09-29 NOTE — Anesthesia Procedure Notes (Signed)
Spinal  Patient location during procedure: OR Start time: 09/29/2017 11:09 AM End time: 09/29/2017 11:10 AM Staffing Anesthesiologist: Martha Clan, MD Resident/CRNA: Leander Rams, CRNA Performed: resident/CRNA  Preanesthetic Checklist Completed: patient identified, site marked, surgical consent, pre-op evaluation, timeout performed, IV checked, risks and benefits discussed and monitors and equipment checked Spinal Block Patient position: sitting Prep: Betadine Patient monitoring: heart rate, continuous pulse ox, blood pressure and cardiac monitor Approach: midline Location: L3-4 Injection technique: single-shot Needle Needle type: Whitacre and Introducer  Needle gauge: 24 G Needle length: 9 cm Additional Notes Negative paresthesia. Negative blood return. Positive free-flowing CSF. Expiration date of kit checked and confirmed. Patient tolerated procedure well, without complications.

## 2017-09-29 NOTE — Progress Notes (Signed)
[facility] [place] [facility]   PATIENT NAME: Alexander Kline    MR#:  [id number]  DATE OF BIRTH:  02/26/1941  SUBJECTIVE:  CHIEF COMPLAINT:   Chief Complaint  Patient presents with  . Fall     Came with a fall and fracture on hip.   Had fever last night. Plan for surgery today.  REVIEW OF SYSTEMS:  CONSTITUTIONAL: positive for fever, fatigue or weakness.  EYES: No blurred or double vision.  EARS, NOSE, AND THROAT: No tinnitus or ear pain.  RESPIRATORY: No cough, shortness of breath, wheezing or hemoptysis.  CARDIOVASCULAR: No chest pain, orthopnea, edema.  GASTROINTESTINAL: No nausea, vomiting, diarrhea or abdominal pain.  GENITOURINARY: No dysuria, hematuria.  ENDOCRINE: No polyuria, nocturia,  HEMATOLOGY: No anemia, easy bruising or bleeding SKIN: No rash or lesion. MUSCULOSKELETAL: No joint pain or arthritis.   NEUROLOGIC: No tingling, numbness, weakness.  PSYCHIATRY: No anxiety or depression.   ROS  DRUG ALLERGIES:   Allergies  Allergen Reactions  . Sulfa Antibiotics     Per pt, he gets bad headaches with sulfa drugs    VITALS:  Blood pressure (!) 101/47, pulse 81, temperature 99.4 F (37.4 C), temperature source Tympanic, resp. rate 18, height 5\' 11"  (1.803 m), weight 81.6 kg (180 lb), SpO2 94 %.  PHYSICAL EXAMINATION:  GENERAL:  76 y.o.-year-old patient lying in the bed with no acute distress.  EYES: Pupils equal, round, reactive to light and accommodation. No scleral icterus. Extraocular muscles intact.  HEENT: Head atraumatic, normocephalic. Oropharynx and nasopharynx clear.  NECK:  Supple, no jugular venous distention. No thyroid enlargement, no tenderness.  LUNGS: Normal breath sounds bilaterally, no wheezing, rales,rhonchi or crepitation. No use of accessory muscles of respiration.  CARDIOVASCULAR: S1, S2 normal. No murmurs, rubs, or gallops.  ABDOMEN: Soft, nontender, nondistended. Bowel sounds present. No organomegaly or mass.   EXTREMITIES: No pedal edema, cyanosis, or clubbing.  NEUROLOGIC: Cranial nerves II through XII are intact. Muscle strength 5/5 in all extremities. Sensation intact. Gait not checked.  PSYCHIATRIC: The patient is alert and oriented x 3.  SKIN: No obvious rash, lesion, or ulcer.   Physical Exam LABORATORY PANEL:   CBC Recent Labs  Lab 09/29/17 0429  WBC 14.5*  HGB 12.3*  HCT 36.9*  PLT 166   ------------------------------------------------------------------------------------------------------------------  Chemistries  Recent Labs  Lab 09/29/17 0429  NA 139  K 4.0  CL 109  CO2 24  GLUCOSE 117*  BUN 23*  CREATININE 1.36*  CALCIUM 8.2*   ------------------------------------------------------------------------------------------------------------------  Cardiac Enzymes No results for input(s): TROPONINI in the last 168 hours. ------------------------------------------------------------------------------------------------------------------  RADIOLOGY:  Dg Chest 1 View  Result Date: 09/28/2017 CLINICAL DATA:  Status post fall today. EXAM: CHEST 1 VIEW COMPARISON:  None. FINDINGS: Lungs are clear. Heart size is normal. No pneumothorax or pleural effusion. The patient is status post fixation of left scapular and left upper rib fractures. IMPRESSION: No acute abnormality. Electronically Signed   By: Drusilla Kannerhomas  Dalessio M.D.   On: 09/28/2017 13:55   Dg Chest 2 View  Result Date: 09/29/2017 CLINICAL DATA:  Low O2 sats, hypoxia EXAM: CHEST  2 VIEW COMPARISON:  09/28/2017 FINDINGS: Heart is borderline in size. Mild vascular congestion and bibasilar atelectasis. Small effusions noted posteriorly on the lateral view. IMPRESSION: Mild vascular congestion and bibasilar atelectasis. Small effusions. Electronically Signed   By: Charlett NoseKevin  Dover M.D.   On: 09/29/2017 08:52   Dg Hip Unilat W Or Wo Pelvis 2-3 Views Right  Result Date:  09/28/2017 CLINICAL DATA:  Right hip pain.  Fall. EXAM: DG  HIP (WITH OR WITHOUT PELVIS) 2-3V RIGHT COMPARISON:  No recent. FINDINGS: Angulated fracture of right femoral neck. Degenerative changes lumbar spine and both hips. Pelvic calcifications consistent with phleboliths. IMPRESSION: Angulated fracture of the right femoral neck. Electronically Signed   By: Maisie Fushomas  Register   On: 09/28/2017 13:53    ASSESSMENT AND PLAN:   Principal Problem:   Hip fracture, unspecified laterality, closed, initial encounter (HCC) Active Problems:   Hip fracture (HCC)  * hypoxia   Have some atelectesis- advise incentive spirometry.   He was advised by me to take heparine Fries for 2 doses only yesterday and suppose to stop last night, as preparing for surgery this morning, but he did not receive it. If he continue to be hypoxic, will have to check for PE.  * Low grade fever   May be due to atelactesis.    Check UA and Xray chest , also send Blood cx.  * Right hip fracture- femoral neck closed fracture.   Pain management and DVT prophylaxis for tonight, then as per ortho.   Plan for surgery tomorrow.   He has very good baseline status- walks 15-20 min fast- 3-4 times a week. No complains for chest pain. Does not need any further cardiac meds, we can proceed with surgery as planned.   Need to be careful about fluids during surgery and may need oxygen support.  * Hyperlipidemia   Cont atorvastatin.  * Borderline hyperglycemia   Stop metformin due to inpatient and surgery and keep on ISS.     All the records are reviewed and case discussed with Care Management/Social Workerr. Management plans discussed with the patient, family and they are in agreement.  CODE STATUS: full.  TOTAL TIME TAKING CARE OF THIS PATIENT: 35 minutes.     POSSIBLE D/C IN 1-2 DAYS, DEPENDING ON CLINICAL CONDITION.   Altamese DillingVaibhavkumar Ziza Hastings M.D on 09/29/2017   Between 7am to 6pm - Pager - [phone]  After 6pm go to [email or web address] - password [email or web address]  [facility] [place]  [facility]  Office  [phone]  CC: Primary care physician; Sherron Mondayejan-Sie, S Ahmed, MD  Note: This dictation was prepared with Dragon dictation along with smaller phrase technology. Any transcriptional errors that result from this process are unintentional.

## 2017-09-29 NOTE — Progress Notes (Signed)
lovenox order increased to 40mg  q 24hr due to crcl >3030ml/hr  Olene FlossMelissa D Clemons Salvucci, Pharm.D, BCPS Clinical Pharmacist

## 2017-09-29 NOTE — NC FL2 (Signed)
[place] MEDICAID FL2 LEVEL OF CARE SCREENING TOOL     IDENTIFICATION  Patient Name: Alexander MeadowRaymond E Douty Jr. Birthdate: 01/10/1941 Sex: male Admission Date (Current Location): 09/28/2017  [place]   Facility and Address:  [facility], [address], [place], [place] [address]      Provider Number: [id number]  Attending Physician Name and Address:  Altamese DillingVachhani, Vaibhavkumar, *  Relative Name and Phone Number:  Cletis MediaDianne Chrisman [phone]    Current Level of Care: Hospital Recommended Level of Care: Skilled Nursing Facility Prior Approval Number:    Date Approved/Denied:   PASRR Number:   [id number]  Discharge Plan: SNF    Current Diagnoses: Patient Active Problem List   Diagnosis Date Noted  . Hip fracture, unspecified laterality, closed, initial encounter (HCC) 09/28/2017  . Hip fracture (HCC) 09/28/2017    Orientation RESPIRATION BLADDER Height & Weight     Self, Time, Situation, Place  Normal Continent Weight: 180 lb (81.6 kg) Height:  5\' 11"  (180.3 cm)  BEHAVIORAL SYMPTOMS/MOOD NEUROLOGICAL BOWEL NUTRITION STATUS      Continent Diet(NPO to be advanced to carb modified)  AMBULATORY STATUS COMMUNICATION OF NEEDS Skin   Extensive Assist Verbally Surgical wounds                       Personal Care Assistance Level of Assistance  Bathing, Feeding, Dressing Bathing Assistance: Limited assistance Feeding assistance: Independent Dressing Assistance: Limited assistance     Functional Limitations Info             SPECIAL CARE FACTORS FREQUENCY  PT (By licensed PT)     PT Frequency: Up to 5X per day, 5 days per week              Contractures Contractures Info: Not present    Additional Factors Info  Code Status, Allergies Code Status Info: Full Allergies Info: Sulfa Antibiotics           Current Medications (09/29/2017):  This is the current hospital active medication list Current  Facility-Administered Medications  Medication Dose Route Frequency Provider Last Rate Last Dose  . 0.9 %  sodium chloride infusion   Intravenous Continuous Altamese DillingVachhani, Vaibhavkumar, MD 100 mL/hr at 09/29/17 0901    . [MAR Hold] acetaminophen (TYLENOL) tablet 650 mg  650 mg Oral Q6H PRN Altamese DillingVachhani, Vaibhavkumar, MD   650 mg at 09/29/17 0413  . [MAR Hold] atorvastatin (LIPITOR) tablet 40 mg  40 mg Oral q1800 Altamese DillingVachhani, Vaibhavkumar, MD      . Mitzi Hansen[MAR Hold] ceFAZolin (ANCEF) IVPB 2g/100 mL premix  2 g Intravenous On Call to OR Deeann SaintMiller, Howard, MD      . Mitzi Hansen[MAR Hold] clindamycin (CLEOCIN) IVPB 600 mg  600 mg Intravenous On Call to OR Deeann SaintMiller, Howard, MD   600 mg at 09/29/17 1119  . [MAR Hold] docusate sodium (COLACE) capsule 100 mg  100 mg Oral BID PRN Altamese DillingVachhani, Vaibhavkumar, MD      . Mitzi Hansen[MAR Hold] insulin aspart (novoLOG) injection 0-9 Units  0-9 Units Subcutaneous TID WC Altamese DillingVachhani, Vaibhavkumar, MD      . Mitzi Hansen[MAR Hold] morphine 2 MG/ML injection 2 mg  2 mg Intravenous Q4H PRN Altamese DillingVachhani, Vaibhavkumar, MD   2 mg at 09/29/17 0048   Facility-Administered Medications Ordered in Other Encounters  Medication Dose Route Frequency Provider Last Rate Last Dose  . bupivacaine (MARCAINE) 0.5 % injection   Other Anesthesia Intra-op Casey BurkittHoang, Thuy, CRNA   3 mL at  09/29/17 1110  . ketamine (KETALAR) injection    Anesthesia Intra-op Casey BurkittHoang, Thuy, CRNA   50 mg at 09/29/17 1102  . midazolam (VERSED) 5 MG/5ML injection    Anesthesia Intra-op Casey BurkittHoang, Thuy, CRNA   1 mg at 09/29/17 1102  . propofol (DIPRIVAN) 10 mg/mL bolus/IV push    Anesthesia Intra-op Casey BurkittHoang, Thuy, CRNA   20 mg at 09/29/17 1117  . propofol (DIPRIVAN) 500 MG/50ML infusion    Continuous PRN Casey BurkittHoang, Thuy, CRNA 17.1 mL/hr at 09/29/17 1118 35 mcg/kg/min at 09/29/17 1118     Discharge Medications: Please see discharge summary for a list of discharge medications.  Relevant Imaging Results:  Relevant Lab Results:   Additional Information SS#[id number]  Judi CongKaren M White,  LCSW

## 2017-09-29 NOTE — Transfer of Care (Signed)
Immediate Anesthesia Transfer of Care Note  Patient: Alexander MeadowRaymond E Victoria Jr.  Procedure(s) Performed: ARTHROPLASTY BIPOLAR HIP (HEMIARTHROPLASTY) (Right )  Patient Location: PACU  Anesthesia Type:Spinal  Level of Consciousness: sedated and responds to stimulation  Airway & Oxygen Therapy: Patient Spontanous Breathing and Patient connected to face mask oxygen  Post-op Assessment: Report given to RN and Post -op Vital signs reviewed and stable  Post vital signs: Reviewed and stable  Last Vitals:  Vitals:   09/29/17 1314 09/29/17 1320  BP: (!) 89/62 104/61  Pulse:  77  Resp:  19  Temp: 36.4 C   SpO2: 97% 100%    Last Pain:  Vitals:   09/29/17 1314  TempSrc:   PainSc: Asleep      Patients Stated Pain Goal: 0 (09/28/17 1728)  Complications: No apparent anesthesia complications

## 2017-09-29 NOTE — Progress Notes (Signed)
Patient alert and oriented. Complaining of pain 1 out of 10. Patient had low grade fever last night of 100.8 and now 99.5. O2 sats 86%-89% on room air. RN placed patient on 2LO2 and patient is now 94%. MD notified and placing orders for chest x-ray.  Harvie HeckMelanie Minervia Osso, RN

## 2017-09-29 NOTE — H&P (Signed)
THE PATIENT WAS SEEN PRIOR TO SURGERY TODAY.  HISTORY, ALLERGIES, HOME MEDICATIONS AND OPERATIVE PROCEDURE WERE REVIEWED. RISKS AND BENEFITS OF SURGERY DISCUSSED WITH PATIENT AGAIN.  NO CHANGES FROM INITIAL HISTORY AND PHYSICAL NOTED.    

## 2017-09-29 NOTE — Anesthesia Post-op Follow-up Note (Signed)
Anesthesia QCDR form completed.        

## 2017-09-29 NOTE — Anesthesia Procedure Notes (Signed)
Performed by: Rhyanna Sorce, CRNA Pre-anesthesia Checklist: Patient identified, Emergency Drugs available, Suction available, Patient being monitored and Timeout performed Oxygen Delivery Method: Simple face mask       

## 2017-09-29 NOTE — Op Note (Signed)
09/28/2017 - 09/29/2017  1:10 PM  PATIENT:  Alexander Kline.   MRN: [id number]  PRE-OPERATIVE DIAGNOSIS:  Displaced Subcapital fracture right hip   POST-OPERATIVE DIAGNOSIS: Same  PROCEDURE:  Right   hip hemiarthroplasty with Stryker Accolade prosthesis  PREOPERATIVE INDICATIONS:  Alexander Kline. is an 76 y.o. male who was admitted 09/28/2017 with a diagnosis of displaced subcapital fracture of the hip and elected for surgical management.  The risks benefits and alternatives were discussed with the patient including but not limited to the risks of nonoperative treatment, versus surgical intervention including infection, bleeding, nerve injury, periprosthetic fracture, the need for revision surgery, dislocation, leg length discrepancy, blood clots, cardiopulmonary complications, morbidity, mortality, among others, and they were willing to proceed.  Predicted outcome is good, although there will be at least a six to nine month expected recovery.     SURGEON:  Earnestine Leys, MD    ANESTHESIA: spinal    COMPLICATIONS:  None.   EBL:  200 cc    COMPONENTS:  Stryker Accolade Femoral Fracture stem size # 6  ,   and a size   54 mm  fracture head unipolar hip ball with    +4 mm  neck length.    PROCEDURE IN DETAIL: The patient was met in the holding area and identified.  The appropriate hip  was marked at the operative site. The patient was then transported to the OR and  placed under general anesthesia.  At that point, the patient was  placed in the lateral decubitus position with the operative side up and  secured to the operating room table and all bony prominences padded.     The operative lower extremity was prepped from the iliac crest to the toes.  Sterile draping was performed.  Time out was performed prior to incision.      A routine posterolateral approach was utilized via sharp dissection  carried down to the subcutaneous tissue.  Gross bleeders were Bovie  coagulated.  The  iliotibial band was identified and incised  along the length of the skin incision.  Self-retaining retractors were  inserted.  With the hip internally rotated, the short external rotators  were identified. The piriformis was tagged and the hip capsule released in a T-type fashion.  The femoral neck was exposed, and I resected the femoral neck using the appropriate jig. This was performed at approximately a thumb's breadth above the lesser trochanter.    I then exposed the deep acetabulum, cleared out any tissue including the ligamentum teres.    I then prepared the proximal femur using the cookie-cutter, the lateralizing reamer, and then sequentially broached.  A trial stem   was  utilized along with a unipolar head and neck.  I reduced the hip and it was found to have excellent stability with functional range of motion. Leg lengths were equal.  The trial components were then removed.   The same size Accolade femoral stem was then inserted and was very stable.  The Unitrax head and neck as trialed were inserted as well.     The hip was then reduced and taken through functional range of motion and found to have excellent stability. Leg lengths were restored.     I closed the T in the capsule with #2 Ticron as well as the short external rotators. A hemovac was not inserted.    I then irrigated the hip copiously again with pulse lavage, and repaired the fascia with #2 Brion Aliment  and the subcutaneous layer with #0 Quill. Sponge and needle counts were correct. Dry sterile Aquacell was applied.   The patient was then awakened and returned to PACU in stable and satisfactory condition. There were no complications.  Park Breed, MD Orthopedic Surgeon [phone]   09/29/2017 1:10 PM

## 2017-09-30 ENCOUNTER — Inpatient Hospital Stay: Payer: Medicare Other

## 2017-09-30 LAB — URINALYSIS, COMPLETE (UACMP) WITH MICROSCOPIC
BILIRUBIN URINE: NEGATIVE
Bacteria, UA: NONE SEEN
GLUCOSE, UA: NEGATIVE mg/dL
Ketones, ur: NEGATIVE mg/dL
LEUKOCYTES UA: NEGATIVE
Nitrite: NEGATIVE
PROTEIN: 30 mg/dL — AB
Specific Gravity, Urine: 1.017 (ref 1.005–1.030)
pH: 5 (ref 5.0–8.0)

## 2017-09-30 LAB — CBC
HEMATOCRIT: 28.8 % — AB (ref 40.0–52.0)
HEMOGLOBIN: 9.6 g/dL — AB (ref 13.0–18.0)
MCH: 30.6 pg (ref 26.0–34.0)
MCHC: 33.5 g/dL (ref 32.0–36.0)
MCV: 91.5 fL (ref 80.0–100.0)
Platelets: 121 10*3/uL — ABNORMAL LOW (ref 150–440)
RBC: 3.15 MIL/uL — ABNORMAL LOW (ref 4.40–5.90)
RDW: 14.6 % — ABNORMAL HIGH (ref 11.5–14.5)
WBC: 11.5 10*3/uL — ABNORMAL HIGH (ref 3.8–10.6)

## 2017-09-30 LAB — BASIC METABOLIC PANEL
Anion gap: 5 (ref 5–15)
BUN: 23 mg/dL — AB (ref 6–20)
CHLORIDE: 106 mmol/L (ref 101–111)
CO2: 21 mmol/L — AB (ref 22–32)
CREATININE: 1.33 mg/dL — AB (ref 0.61–1.24)
Calcium: 7.8 mg/dL — ABNORMAL LOW (ref 8.9–10.3)
GFR calc non Af Amer: 50 mL/min — ABNORMAL LOW (ref >60)
GFR, EST AFRICAN AMERICAN: 58 mL/min — AB (ref >60)
Glucose, Bld: 119 mg/dL — ABNORMAL HIGH (ref 65–99)
Potassium: 4 mmol/L (ref 3.5–5.1)
Sodium: 132 mmol/L — ABNORMAL LOW (ref 135–145)

## 2017-09-30 LAB — GLUCOSE, CAPILLARY
GLUCOSE-CAPILLARY: 107 mg/dL — AB (ref 65–99)
Glucose-Capillary: 126 mg/dL — ABNORMAL HIGH (ref 65–99)
Glucose-Capillary: 122 mg/dL — ABNORMAL HIGH (ref 65–99)
Glucose-Capillary: 133 mg/dL — ABNORMAL HIGH (ref 65–99)

## 2017-09-30 MED ORDER — VANCOMYCIN HCL IN DEXTROSE 750-5 MG/150ML-% IV SOLN
750.0000 mg | Freq: Two times a day (BID) | INTRAVENOUS | Status: DC
  Administered 2017-09-30 – 2017-10-01 (×2): 750 mg via INTRAVENOUS
  Filled 2017-09-30 (×4): qty 150

## 2017-09-30 MED ORDER — DEXTROSE 5 % IV SOLN
2.0000 g | Freq: Two times a day (BID) | INTRAVENOUS | Status: DC
  Administered 2017-09-30 – 2017-10-01 (×3): 2 g via INTRAVENOUS
  Filled 2017-09-30 (×5): qty 2

## 2017-09-30 MED ORDER — IBUPROFEN 400 MG PO TABS
600.0000 mg | ORAL_TABLET | Freq: Four times a day (QID) | ORAL | Status: DC | PRN
  Administered 2017-09-30 – 2017-10-01 (×3): 600 mg via ORAL
  Filled 2017-09-30 (×3): qty 2

## 2017-09-30 MED ORDER — VANCOMYCIN HCL 10 G IV SOLR
1500.0000 mg | Freq: Once | INTRAVENOUS | Status: AC
  Administered 2017-09-30: 1500 mg via INTRAVENOUS
  Filled 2017-09-30: qty 1500

## 2017-09-30 MED ORDER — MAGNESIUM HYDROXIDE 400 MG/5ML PO SUSP
15.0000 mL | Freq: Every day | ORAL | Status: DC | PRN
  Administered 2017-09-30: 15 mL via ORAL
  Filled 2017-09-30 (×2): qty 30

## 2017-09-30 MED ORDER — IBUPROFEN 100 MG/5ML PO SUSP
600.0000 mg | Freq: Four times a day (QID) | ORAL | Status: DC | PRN
Start: 2017-09-30 — End: 2017-09-30
  Filled 2017-09-30: qty 30

## 2017-09-30 NOTE — Progress Notes (Signed)
Patient family member tell RN that she saw a red area on patient' left mid back when PT was working with patient. RN checked with Darien RamusAna RN and patient has small abrasion on left mid back that was not there on skin assessment yesterday after surgery. RN cleansed area and placed foam over it. Patient states that he does not feel the abrasion. RN will pass on in report.   Harvie HeckMelanie Calin Fantroy, RN

## 2017-09-30 NOTE — Progress Notes (Signed)
Patient had oral temp at 101. MD notified. RN giving PO tylenol. No new orders at this time.   Harvie HeckMelanie Diangelo Radel, RN

## 2017-09-30 NOTE — Clinical Social Work Note (Signed)
CSW visited the patient and his wife at bedside to discuss discharge planning. The patient and his wife have declined STR and would like to pursue home health options. CSW is signing off. Please consult should needs arise.  Argentina PonderKaren Martha Rileyann Florance, MSW, Theresia MajorsLCSWA [phone]

## 2017-09-30 NOTE — Consult Note (Signed)
Pharmacy Antibiotic Note  Alexander MeadowRaymond E Knapper Jr. is a 76 y.o. male admitted on 09/28/2017 with sepsis.  Pharmacy has been consulted for vancomycin and cefepime dosing.  Plan: Vancomycin 1500mg  once Vancomycin 750mg  IV every 12 hours.  Goal trough 15-20 mcg/mL.  Trough prior to the 5th dose Cefepime 2g q 12 hrs  Height: 5\' 11"  (180.3 cm) Weight: 180 lb (81.6 kg) IBW/kg (Calculated) : 75.3  Temp (24hrs), Avg:99.6 F (37.6 C), Min:97.6 F (36.4 C), Max:102.9 F (39.4 C)  Recent Labs  Lab 09/28/17 1251 09/29/17 0429 09/30/17 0527  WBC 10.3 14.5* 11.5*  CREATININE 1.27* 1.36* 1.33*    Estimated Creatinine Clearance: 49.5 mL/min (A) (by C-G formula based on SCr of 1.33 mg/dL (H)).    Allergies  Allergen Reactions  . Sulfa Antibiotics     Per pt, he gets bad headaches with sulfa drugs    Antimicrobials this admission: Clinda/ancef 12/1 >> 12/2 (surgical prophylaxis vancomycin 12/2 >>  Cefepime 12/2>>  Dose adjustments this admission:   Microbiology results: 12/1 BCx:  NG <24hr 11/30 MRSA PCR: neg  Thank you for allowing pharmacy to be a part of this patient's care.   Olene FlossMelissa D Maccia, Pharm.D, BCPS Clinical Pharmacist  09/30/2017 9:31 AM

## 2017-09-30 NOTE — Progress Notes (Signed)
Patient only has had 20cc urine out since 7am. RN bladder scanned patient. Bladder scanned showed 663cc. MD has ordered for RN to do in and out cath for bladder scan >400cc

## 2017-09-30 NOTE — Progress Notes (Signed)
[facility] - [place] at [facility]   PATIENT NAME: Alexander Kline    MR#:  [id number]  DATE OF BIRTH:  03/30/1941  SUBJECTIVE:  CHIEF COMPLAINT:   Chief Complaint  Patient presents with  . Fall     Came with a fall and fracture on hip. S/p hemiarthroplasty 09/29/17.   Had fever yesterday.  REVIEW OF SYSTEMS:  CONSTITUTIONAL: positive for fever, fatigue or weakness.  EYES: No blurred or double vision.  EARS, NOSE, AND THROAT: No tinnitus or ear pain.  RESPIRATORY: No cough, shortness of breath, wheezing or hemoptysis.  CARDIOVASCULAR: No chest pain, orthopnea, edema.  GASTROINTESTINAL: No nausea, vomiting, diarrhea or abdominal pain.  GENITOURINARY: No dysuria, hematuria.  ENDOCRINE: No polyuria, nocturia,  HEMATOLOGY: No anemia, easy bruising or bleeding SKIN: No rash or lesion. MUSCULOSKELETAL: No joint pain or arthritis.   NEUROLOGIC: No tingling, numbness, weakness.  PSYCHIATRY: No anxiety or depression.   ROS  DRUG ALLERGIES:   Allergies  Allergen Reactions  . Sulfa Antibiotics     Per pt, he gets bad headaches with sulfa drugs    VITALS:  Blood pressure (!) 130/57, pulse 61, temperature 99.2 F (37.3 C), temperature source Oral, resp. rate 16, height 5\' 11"  (1.803 m), weight 81.6 kg (180 lb), SpO2 98 %.  PHYSICAL EXAMINATION:  GENERAL:  76 y.o.-year-old patient lying in the bed with no acute distress.  EYES: Pupils equal, round, reactive to light and accommodation. No scleral icterus. Extraocular muscles intact.  HEENT: Head atraumatic, normocephalic. Oropharynx and nasopharynx clear.  NECK:  Supple, no jugular venous distention. No thyroid enlargement, no tenderness.  LUNGS: Normal breath sounds bilaterally, no wheezing, rales,rhonchi or crepitation. No use of accessory muscles of respiration.  CARDIOVASCULAR: S1, S2 normal. No murmurs, rubs, or gallops.  ABDOMEN: Soft, nontender, nondistended. Bowel sounds present. No organomegaly or mass.   EXTREMITIES: No pedal edema, cyanosis, or clubbing.  NEUROLOGIC: Cranial nerves II through XII are intact. Muscle strength 4/5 in all extremities. Sensation intact. Gait not checked.  PSYCHIATRIC: The patient is alert and oriented x 3.  SKIN: No obvious rash, lesion, or ulcer.   Physical Exam LABORATORY PANEL:   CBC Recent Labs  Lab 09/30/17 0527  WBC 11.5*  HGB 9.6*  HCT 28.8*  PLT 121*   ------------------------------------------------------------------------------------------------------------------  Chemistries  Recent Labs  Lab 09/30/17 0527  NA 132*  K 4.0  CL 106  CO2 21*  GLUCOSE 119*  BUN 23*  CREATININE 1.33*  CALCIUM 7.8*   ------------------------------------------------------------------------------------------------------------------  Cardiac Enzymes No results for input(s): TROPONINI in the last 168 hours. ------------------------------------------------------------------------------------------------------------------  RADIOLOGY:  Dg Chest 2 View  Result Date: 09/29/2017 CLINICAL DATA:  Low O2 sats, hypoxia EXAM: CHEST  2 VIEW COMPARISON:  09/28/2017 FINDINGS: Heart is borderline in size. Mild vascular congestion and bibasilar atelectasis. Small effusions noted posteriorly on the lateral view. IMPRESSION: Mild vascular congestion and bibasilar atelectasis. Small effusions. Electronically Signed   By: Charlett NoseKevin  Dover M.D.   On: 09/29/2017 08:52   Ct Chest Wo Contrast  Result Date: 09/30/2017 CLINICAL DATA:  Fever after hip arthroplasty surgery. EXAM: CT CHEST WITHOUT CONTRAST TECHNIQUE: Multidetector CT imaging of the chest was performed following the standard protocol without IV contrast. COMPARISON:  Chest x-ray on 09/29/2017 FINDINGS: Cardiovascular: The heart size is normal. Calcified plaque is seen in a 3 vessel coronary distribution. No pericardial fluid is identified. The thoracic aorta is normal in caliber. Central pulmonary arteries are also normal  in caliber. Mediastinum/Nodes: No evidence  of mediastinal mass. Some small mediastinal lymph nodes are identified. Only a single lymph node is borderline in size lying adjacent to the aortic arch and measuring 13 mm in short axis. No definite hilar or axillary lymphadenopathy. Lungs/Pleura: Lungs show trace bilateral pleural effusions with bibasilar atelectasis. No focal airspace consolidation, edema, pneumothorax or pulmonary nodule is identified. Upper Abdomen: Atrophic pancreas demonstrating diffuse fatty infiltration. Mildly distended appearance of the gallbladder without evidence of surrounding inflammation or visible gallstones by CT. Musculoskeletal: No chest wall mass or suspicious bone lesions identified. IMPRESSION: 1. No evidence of acute pneumonia by CT. There are trace bilateral pleural effusions with bibasilar atelectasis. 2. Coronary atherosclerosis with calcified plaque in a 3 vessel distribution. 3. Small mediastinal lymph nodes with a single borderline sized prevascular lymph node adjacent to the aortic arch measuring 13 mm. 4. Mildly distended appearance of the gallbladder by CT without evidence of surrounding inflammation or visible gallstones by CT. If there is any right upper quadrant abdominal pain or tenderness, correlation with right upper quadrant abdominal ultrasound may be indicated. Electronically Signed   By: Irish LackGlenn  Yamagata M.D.   On: 09/30/2017 10:55   Dg Hip Unilat With Pelvis 1v Right  Result Date: 09/29/2017 CLINICAL DATA:  Postop right hip replacement EXAM: DG HIP (WITH OR WITHOUT PELVIS) 1V RIGHT COMPARISON:  09/28/2017 FINDINGS: Frontal view of the lower pelvis with cross-table lateral view of the right hip show the patient be status post interval right hip hemiarthroplasty. Femoral component is located in the acetabulum. Gas in the overlying soft tissues compatible with the immediate postoperative state. No evidence for immediate hardware complication. IMPRESSION: Status  post right hip hemiarthroplasty without evidence for immediate postoperative complicating features. Electronically Signed   By: Kennith CenterEric  Mansell M.D.   On: 09/29/2017 14:03    ASSESSMENT AND PLAN:   Principal Problem:   Hip fracture, unspecified laterality, closed, initial encounter [facility](HCC) Active Problems:   Hip fracture (HCC)  * hypoxia   Have some atelectesis- advise incentive spirometry.   He was advised by me to take heparine Bern for 2 doses only yesterday and suppose to stop last night, as preparing for surgery this morning, but he did not receive it.  Today wanted to do CT angio chest, but he refused of having hypoxia, so did CT without contrast and that does not show pneumonia.  *  fever   May be due to atelactesis.    Checked UA and Xray chest , also send Blood cx.   As fever persist- start on vanc + cefepime.   CT chest- no pneumonia.  * Right hip fracture- femoral neck closed fracture.   Pain management and DVT prophylaxis for tonight, then as per ortho.  s/p hemiarthroplasty 09/29/17.  * Hyperlipidemia   Cont atorvastatin.  * Borderline hyperglycemia   Stop metformin due to inpatient and surgery and keep on ISS.  * anemia due to blood loss   Monitor.  All the records are reviewed and case discussed with Care Management/Social Workerr. Management plans discussed with the patient, family and they are in agreement.  CODE STATUS: full.  TOTAL TIME TAKING CARE OF THIS PATIENT: 35 minutes.     POSSIBLE D/C IN 1-2 DAYS, DEPENDING ON CLINICAL CONDITION.   Altamese DillingVaibhavkumar Vandy Tsuchiya M.D on 09/30/2017   Between 7am to 6pm - Pager - [phone]  After 6pm go to [email or web address] - password [email or web address]  [facility] [place] [facility]  Office  [phone]  CC: Primary care physician; Sherron Mondayejan-Sie, S Ahmed, MD  Note: This dictation was prepared with Dragon dictation along with smaller phrase technology. Any transcriptional errors that result from this process are unintentional.

## 2017-09-30 NOTE — Anesthesia Postprocedure Evaluation (Signed)
Anesthesia Post Note  Patient: Alexander MeadowRaymond E Saladin Jr.  Procedure(s) Performed: ARTHROPLASTY BIPOLAR HIP (HEMIARTHROPLASTY) (Right )  Patient location during evaluation: Nursing Unit Anesthesia Type: Spinal Level of consciousness: oriented and awake and alert Pain management: pain level controlled Vital Signs Assessment: post-procedure vital signs reviewed and stable Respiratory status: spontaneous breathing, respiratory function stable and patient connected to nasal cannula oxygen Cardiovascular status: blood pressure returned to baseline and stable Postop Assessment: no headache, no backache and no apparent nausea or vomiting Anesthetic complications: no     Last Vitals:  Vitals:   09/30/17 0600 09/30/17 0751  BP:  131/68  Pulse:  (!) 105  Resp:  16  Temp: 37.3 C 37.8 C  SpO2:  94%    Last Pain:  Vitals:   09/30/17 0751  TempSrc: Oral  PainSc:                  Alexander Kline

## 2017-09-30 NOTE — Progress Notes (Signed)
Patient alert and oriented. Complaining of moderate pain in right hip. Minimal drainage on Aquacel dressing. Vital sign stable other than a temp of 100.1, RN gave tylenol. RN will continue to monitor.  Harvie HeckMelanie Odesser Tourangeau, RN

## 2017-09-30 NOTE — Progress Notes (Signed)
Subjective: Patient is alert and cooperative and oriented. Patient is alert and cooperative and oriented. Slept well last night he said. Mild pain. Anxious to get up and moving.    1 Day Post-Op Procedure(s) (LRB): ARTHROPLASTY BIPOLAR HIP (HEMIARTHROPLASTY) (Right)    Patient reports pain as mild.  Objective:  Hip is stable. The dressing is minimal drainage.     Neurovascular status is good distally.      Vitals:   09/30/17 0751 09/30/17 1044  BP: 131/68   Pulse: (!) 105 70  Resp: 16   Temp: 100.1 F (37.8 C) (!) 101.1 F (38.4 C)  SpO2: 94% 93%    Neurologically intact ABD soft Neurovascular intact Sensation intact distally Intact pulses distally Dorsiflexion/Plantar flexion intact Incision: scant drainage  LABS Recent Labs    09/28/17 1251 09/29/17 0429 09/30/17 0527  HGB 12.4* 12.3* 9.6*  HCT 37.1* 36.9* 28.8*  WBC 10.3 14.5* 11.5*  PLT 200 166 121*    Recent Labs    09/28/17 1251 09/29/17 0429 09/30/17 0527  NA 140 139 132*  K 3.6 4.0 4.0  BUN 19 23* 23*  CREATININE 1.27* 1.36* 1.33*  GLUCOSE 117* 117* 119*    Recent Labs    09/28/17 1251  INR 1.06     Assessment/Plan: 1 Day Post-Op Procedure(s) (LRB): ARTHROPLASTY BIPOLAR HIP (HEMIARTHROPLASTY) (Right)   [facility] diet Up with therapy D/C IV fluids Discharge to SNF

## 2017-09-30 NOTE — Progress Notes (Signed)
Physical Therapy Evaluation Patient Details Name: Alexander MeadowRaymond E Gauger Jr. MRN: [id number] DOB: 05/28/1941 Today's Date: 09/30/2017   History of Present Illness   Alexander MeadowRaymond E Ozer Jr. is an 76 y.o. male who was admitted 09/28/2017 with a diagnosis of displaced subcapital fracture of the hip and elected for surgical management.  Clinical Impression  Patient is s/p right hip hemiarthroplasty , posterior approach and is PWB RLE with bucks traction. Patient needs max assist for bed mobility including rolling and supine to sit and sit to supine, and is not able to perform a full upright stand during sit to stand transfer with left knee in flexed position. Patient has great difficulty getting to the edge of bed and has posterior lean. Patient is having decreased alertness, decreased ability to follow commands and has trace strength in R knee quad and decreased strength in LLE hip and knee 2/5. Patient has high amount of R hip pain during bed mobility. Patient will benefit from skilled PT to improve all mobility and progress towards goals of transfers and gait.     Follow Up Recommendations SNF    Equipment Recommendations  Rolling walker with 5" wheels    Recommendations for Other Services       Precautions / Restrictions Precautions Precautions: Posterior Hip(PWB right) Restrictions Weight Bearing Restrictions: Yes RLE Weight Bearing: Partial weight bearing Other Position/Activity Restrictions: bucks traction, posterior hip precations      Mobility  Bed Mobility Overal bed mobility: Needs Assistance Bed Mobility: Supine to Sit;Sit to Supine;Rolling Rolling: Max assist;+2 for physical assistance   Supine to sit: Max assist Sit to supine: Max assist   General bed mobility comments: (needs to use hospital bed and max assist)  Transfers Overall transfer level: Needs assistance Equipment used: Rolling walker (2 wheeled)             General transfer comment: Patient is not able to stand  up form edge of the bed, unable to straighten LLE knee with max assit and RW  Ambulation/Gait Ambulation/Gait assistance: (unable)              Stairs            Wheelchair Mobility    Modified Rankin (Stroke Patients Only)       Balance Overall balance assessment: Needs assistance Sitting-balance support: Bilateral upper extremity supported;Feet supported Sitting balance-Leahy Scale: Fair   Postural control: Posterior lean Standing balance support: (unable to assess)                                 Pertinent Vitals/Pain Pain Assessment: Faces Pain Score: 7  Faces Pain Scale: Hurts whole lot Pain Location: right hip Pain Descriptors / Indicators: Grimacing Pain Intervention(s): Limited activity within patient's tolerance    Home Living Family/patient expects to be discharged to:: Skilled nursing facility Living Arrangements: Alone                    Prior Function Level of Independence: Independent               Hand Dominance        Extremity/Trunk Assessment   Upper Extremity Assessment Upper Extremity Assessment: Overall WFL for tasks assessed    Lower Extremity Assessment Lower Extremity Assessment: Defer to PT evaluation       Communication   Communication: Other (comment)(Patient follows commands 50% of the time)  Cognition Arousal/Alertness: Lethargic Behavior During Therapy:  Flat affect Overall Cognitive Status: Impaired/Different from baseline Area of Impairment: Attention;Following commands;Safety/judgement;Awareness;Problem solving                   Current Attention Level: (poor focus)   Following Commands: Follows one step commands inconsistently Safety/Judgement: Decreased awareness of safety;Decreased awareness of deficits   Problem Solving: Requires tactile cues        General Comments      Exercises General Exercises - Lower Extremity Ankle Circles/Pumps: (AAROM BLE with constant  cues 100% to stay focused )   Assessment/Plan    PT Assessment Patient needs continued PT services  PT Problem List Decreased strength;Decreased activity tolerance;Decreased balance;Decreased mobility;Decreased cognition;Decreased knowledge of use of DME;Decreased safety awareness;Decreased knowledge of precautions;Pain       PT Treatment Interventions      PT Goals (Current goals can be found in the Care Plan section)  Acute Rehab PT Goals Patient Stated Goal: to walk PT Goal Formulation: With patient Time For Goal Achievement: 10/19/17 Potential to Achieve Goals: Fair    Frequency BID   Barriers to discharge Decreased caregiver support;Inaccessible home environment      Co-evaluation               AM-PAC PT "6 Clicks" Daily Activity  Outcome Measure Difficulty turning over in bed (including adjusting bedclothes, sheets and blankets)?: Unable Difficulty moving from lying on back to sitting on the side of the bed? : Unable Difficulty sitting down on and standing up from a chair with arms (e.g., wheelchair, bedside commode, etc,.)?: Unable Help needed moving to and from a bed to chair (including a wheelchair)?: Total Help needed walking in hospital room?: Total Help needed climbing 3-5 steps with a railing? : Total 6 Click Score: 6    End of Session   Activity Tolerance: Patient limited by fatigue;Patient limited by lethargy;Patient limited by pain Patient left: in bed;with call H[place]/phone within reach;with nursing/sitter in room;with family/visitor present;with SCD's reapplied Nurse Communication: Mobility status PT Visit Diagnosis: Pain;Difficulty in walking, not elsewhere classified (R26.2);Muscle weakness (generalized) (M62.81) Pain - Right/Left: Right Pain - part of body: Hip    Time: 1200-1240 PT Time Calculation (min) (ACUTE ONLY): 40 min   Charges:   PT Evaluation $PT Eval Low Complexity: 1 Low PT Treatments $Therapeutic Exercise: 8-22  mins $Therapeutic Activity: 8-22 mins   PT G Codes:   PT G-Codes **NOT FOR INPATIENT CLASS** Functional Assessment Tool Used: Clinical judgement;AM-PAC 6 Clicks Basic Mobility Functional Limitation: Mobility: Walking and moving around Mobility: Walking and Moving Around Current Status ([id number]([id number]): At least 80 percent but less than 100 percent impaired, limited or restricted Mobility: Walking and Moving Around Goal Status [phone]([id number]): At least 80 percent but less than 100 percent impaired, limited or restricted      Ezekiel InaMansfield, Kristine S, PT DPT 09/30/2017, 1:31 PM

## 2017-10-01 ENCOUNTER — Inpatient Hospital Stay: Payer: Medicare Other

## 2017-10-01 ENCOUNTER — Encounter: Payer: Self-pay | Admitting: Specialist

## 2017-10-01 LAB — GLUCOSE, CAPILLARY
GLUCOSE-CAPILLARY: 132 mg/dL — AB (ref 65–99)
GLUCOSE-CAPILLARY: 124 mg/dL — AB (ref 65–99)
Glucose-Capillary: 102 mg/dL — ABNORMAL HIGH (ref 65–99)
Glucose-Capillary: 123 mg/dL — ABNORMAL HIGH (ref 65–99)

## 2017-10-01 LAB — COMPREHENSIVE METABOLIC PANEL
ALT: 12 U/L — ABNORMAL LOW (ref 17–63)
ANION GAP: 4 — AB (ref 5–15)
AST: 42 U/L — ABNORMAL HIGH (ref 15–41)
Albumin: 2.3 g/dL — ABNORMAL LOW (ref 3.5–5.0)
Alkaline Phosphatase: 58 U/L (ref 38–126)
BUN: 22 mg/dL — ABNORMAL HIGH (ref 6–20)
CHLORIDE: 107 mmol/L (ref 101–111)
CO2: 23 mmol/L (ref 22–32)
Calcium: 7.7 mg/dL — ABNORMAL LOW (ref 8.9–10.3)
Creatinine, Ser: 1.19 mg/dL (ref 0.61–1.24)
GFR calc Af Amer: >60 mL/min (ref >60)
GFR calc non Af Amer: 57 mL/min — ABNORMAL LOW (ref >60)
Glucose, Bld: 114 mg/dL — ABNORMAL HIGH (ref 65–99)
Potassium: 3.9 mmol/L (ref 3.5–5.1)
SODIUM: 134 mmol/L — AB (ref 135–145)
Total Bilirubin: 0.6 mg/dL (ref 0.3–1.2)
Total Protein: 5 g/dL — ABNORMAL LOW (ref 6.5–8.1)

## 2017-10-01 LAB — CBC
HCT: 26.3 % — ABNORMAL LOW (ref 40.0–52.0)
Hemoglobin: 8.9 g/dL — ABNORMAL LOW (ref 13.0–18.0)
MCH: 30.9 pg (ref 26.0–34.0)
MCHC: 33.9 g/dL (ref 32.0–36.0)
MCV: 91.1 fL (ref 80.0–100.0)
PLATELETS: 103 10*3/uL — AB (ref 150–440)
RBC: 2.89 MIL/uL — ABNORMAL LOW (ref 4.40–5.90)
RDW: 14.6 % — AB (ref 11.5–14.5)
WBC: 9.2 10*3/uL (ref 3.8–10.6)

## 2017-10-01 MED ORDER — POLYETHYLENE GLYCOL 3350 17 G PO PACK: 17.0000 g | PACK | Freq: Every day | ORAL | Status: DC | PRN

## 2017-10-01 MED ORDER — DEXTROSE 5 % IV SOLN
1.0000 g | Freq: Every day | INTRAVENOUS | Status: DC
  Filled 2017-10-01 (×2): qty 10

## 2017-10-01 NOTE — Care Management Note (Signed)
Case Management Note  Patient Details  Name: Alexander Kline. MRN: [id number] Date of Birth: [birth year]  Subjective/Objective: POD # 2. Right hemiarthroplasty. Patient working with PT during assessment. Met with patients wife and daughter. Daughter states patient wants to go home and not to SNF.  Patient will be going to the daughters home to stay. Someone will be with patient 24/7.  Alexander Kline- daughter [address]  [place], [place] [address] [phone] He will need a walker and bsc. Ordered from [facility]. Offered choice of home health agencies. They prefer [facility] for HHPT.  It is anticipated that patient will be discharged home tomorrow.                   Action/Plan: [facility] for HHPT, DME from [facility].   Expected Discharge Date:                  Expected Discharge Plan:  [place]  In-House Referral:  Clinical Social Work  Discharge planning Services  CM Consult  Post Acute Care Choice:  Durable Medical Equipment, Home Health Choice offered to:  Adult Children, Spouse  DME Arranged:  Bedside commode, Walker rolling DME Agency:  [employer] Arranged:  PT [place] Agency:  [facility]  Status of Service:  In process, will continue to follow  If discussed at Long Length of Stay Meetings, dates discussed:    Additional Comments:  Jolly Mango, RN 10/01/2017, 2:28 PM

## 2017-10-01 NOTE — Progress Notes (Signed)
Physical Therapy Treatment Patient Details Name: Alexander MeadowRaymond E Hollabaugh Jr. MRN: [id number] DOB: 08/14/1941 Today's Date: 10/01/2017    History of Present Illness Pt is a 76 y.o. male who was admitted 09/28/2017 with a diagnosis of displaced subcapital fracture of the hip and elected for surgical management.  Pt s/p R hip hemiarthroplasty 09/29/17.    PT Comments    Pt able to progress to ambulating 30 feet with RW (and then ambulating another 20 feet with RW) but distance limited d/t fatigue in general and UE's fatiguing (pt requiring sitting rest break between ambulation trials in order to rest and maintain PWB'ing status ambulating with use of RW).  Pt's daughter able to verbalize and give pt appropriate vc's for transfer and gait technique during session (as well as posterior hip precautions and WB'ing precautions).  PT recommending STR but pt and pt's daughter plan for pt to discharge to pt's daughter's home (level entry home).    Follow Up Recommendations  SNF     Equipment Recommendations  Rolling walker with 5" wheels;3in1 (PT)    Recommendations for Other Services       Precautions / Restrictions Precautions Precautions: Posterior Hip;Fall Precaution Comments: Pt required verbal cues for 3/3 posterior hip precautions (pt's wife and daughter able to verbalize 3/3) Restrictions Weight Bearing Restrictions: Yes RLE Weight Bearing: Partial weight bearing Other Position/Activity Restrictions: Bucks traction    Mobility  Bed Mobility               General bed mobility comments: Deferred d/t pt up in recliner beginning and end of session.  Transfers Overall transfer level: Needs assistance Equipment used: Rolling walker (2 wheeled) Transfers: Sit to/from Stand Sit to Stand: Min assist         General transfer comment: x5 trials sit to/from stand from recliner; consistent vc's required for UE and LE placement (including posterior hip precautions) and transfer  technique  Ambulation/Gait Ambulation/Gait assistance: Min guard;Min assist Ambulation Distance (Feet): (30 feet; 20 feet) Assistive device: Rolling walker (2 wheeled) Gait Pattern/deviations: Step-to pattern Gait velocity: decreased   General Gait Details: intermittent vc's for gait pattern and increasing UE support through RW to maintain PWB'ing status; vc's for walker use; limited distance d/t fatigue   Stairs            Wheelchair Mobility    Modified Rankin (Stroke Patients Only)       Balance Overall balance assessment: Needs assistance Sitting-balance support: Feet supported;Single extremity supported Sitting balance-Leahy Scale: Poor Sitting balance - Comments: requires at least single UE support for sitting balance   Standing balance support: Bilateral upper extremity supported Standing balance-Leahy Scale: Poor Standing balance comment: requires UE support on RW for static standing balance                            Cognition Arousal/Alertness: Awake/alert Behavior During Therapy: WFL for tasks assessed/performed Overall Cognitive Status: Within Functional Limits for tasks assessed                                        Exercises     General Comments General comments (skin integrity, edema, etc.): Pt resting in recliner upon PT entry; pt's wife and daughter present.  Pt agreeable to PT session.      Pertinent Vitals/Pain Pain Assessment: 0-10 Pain Score: 2  Pain Location:  Right hip Pain Descriptors / Indicators: Grimacing;Sore Pain Intervention(s): Limited activity within patient's tolerance;Monitored during session;Premedicated before session;Repositioned    Home Living Family/patient expects to be discharged to:: Private residence Living Arrangements: Alone Available Help at Discharge: Family;Available PRN/intermittently;Available 24 hours/day Type of Home: (pt planning to stay with dtr after surgery) Home Access:  Level entry   Home Layout: Two level;Able to live on main level with bedroom/bathroom Home Equipment: None      Prior Function Level of Independence: Independent      Comments: pt indep with mobility, ADL, driving, no falls in past 12 months   PT Goals (current goals can now be found in the care plan section) Acute Rehab PT Goals Patient Stated Goal: go home PT Goal Formulation: With patient/family Time For Goal Achievement: 10/19/17 Potential to Achieve Goals: Fair Progress towards PT goals: Progressing toward goals    Frequency    BID      PT Plan Current plan remains appropriate    Co-evaluation              AM-PAC PT "6 Clicks" Daily Activity  Outcome Measure  Difficulty turning over in bed (including adjusting bedclothes, sheets and blankets)?: Unable Difficulty moving from lying on back to sitting on the side of the bed? : Unable Difficulty sitting down on and standing up from a chair with arms (e.g., wheelchair, bedside commode, etc,.)?: Unable Help needed moving to and from a bed to chair (including a wheelchair)?: A Lot Help needed walking in hospital room?: A Little Help needed climbing 3-5 steps with a railing? : A Lot 6 Click Score: 10    End of Session Equipment Utilized During Treatment: Gait belt Activity Tolerance: Patient limited by fatigue Patient left: in chair;with call [place]/phone within reach;with chair alarm set;with family/visitor present(pt declined SCD's; pillows placed to elevated B heels and also pillows placed between pt's knees for posterior hip precautions) Nurse Communication: Mobility status;Precautions;Weight bearing status PT Visit Diagnosis: Pain;Difficulty in walking, not elsewhere classified (R26.2);Muscle weakness (generalized) (M62.81) Pain - Right/Left: Right Pain - part of body: Hip     Time: 1345-1431 PT Time Calculation (min) (ACUTE ONLY): 46 min  Charges:  $Gait Training: 8-22 mins $Therapeutic Activity: 23-37  mins                    G CodesHendricks Limes:       Catera Hankins, PT 10/01/17, 4:38 PM [phone]

## 2017-10-01 NOTE — Evaluation (Signed)
Occupational Therapy Evaluation Patient Details Name: Alexander MeadowRaymond E Milholland Jr. MRN: [id number] DOB: 04/13/1941 Today's Date: 10/01/2017    History of Present Illness Pt is a 76 y.o. male who was admitted 09/28/2017 with a diagnosis of displaced subcapital fracture of the hip and elected for surgical management.  Pt s/p R hip hemiarthroplasty 09/29/17.   Clinical Impression   Pt seen for OT evaluation this date. Pt was living by himself (but has spouse who was present along with dtr for evaluation). Pt plans to stay with daughter after the hospital where she will be able to check in on him at any time (she will be next door working). Pt was independent prior to surgery and eager to return to PLOF. Currently, pt requires min-mod assist for LB ADL tasks in order to maintain posterior hip precautions. Pt/family educated in AE/DME, home/routines modifications, falls prevention, and posterior THPs to maximize safety and functional independence. Pt would benefit from additional OT services to support recall and carryover of learned information and trial use of AE to maximize safety. Recommend STR following hospitalization. Will continue to assess for appropriateness of HHOT services while pt is in hospital.    Follow Up Recommendations  SNF    Equipment Recommendations  3 in 1 bedside commode;Other (comment)(reacher)    Recommendations for Other Services       Precautions / Restrictions Precautions Precautions: Posterior Hip;Fall Precaution Comments: Pt required verbal cues for 3/3 posterior hip precautions, review of education provided to pt/family in room to support carryover  Restrictions Weight Bearing Restrictions: Yes RLE Weight Bearing: Partial weight bearing Other Position/Activity Restrictions: [place] traction      Mobility Bed Mobility     General bed mobility comments: deferred, pt up in recliner  Transfers    Balance Overall balance assessment: Needs assistance Sitting-balance  support: Bilateral upper extremity supported;Feet supported Sitting balance-Leahy Scale: Poor Sitting balance - Comments: requires UE support on bed for sitting balance                             ADL either performed or assessed with clinical judgement   ADL Overall ADL's : Needs assistance/impaired                                       General ADL Comments: Pt generally min-mod assist for LB ADL tasks due to posterior hip precautions; pt/family educated in AE for LB ADL tasks.     Vision Baseline Vision/History: Wears glasses Wears Glasses: At all times Patient Visual Report: No change from baseline Vision Assessment?: No apparent visual deficits     Perception     Praxis      Pertinent Vitals/Pain Pain Assessment: No/denies pain      Hand Dominance     Extremity/Trunk Assessment Upper Extremity Assessment Upper Extremity Assessment: Overall WFL for tasks assessed   Lower Extremity Assessment Lower Extremity Assessment: Defer to PT evaluation   Cervical / Trunk Assessment Cervical / Trunk Assessment: Normal   Communication Communication Communication: No difficulties   Cognition Arousal/Alertness: Awake/alert Behavior During Therapy: WFL for tasks assessed/performed Overall Cognitive Status: Within Functional Limits for tasks assessed                                     General  Comments  Pt resting in bed upon PT entry; pt's wife and daughter present.    Exercises Other Exercises Other Exercises: pt/spouse/dtr educated in home/routines modifications, falls prevention, height of furniture to improve pt's mobility and self care skills while recovering with posterior hip precautions in place. Pt/family verbalized understanding.   Shoulder Instructions      Home Living Family/patient expects to be discharged to:: Private residence Living Arrangements: Alone Available Help at Discharge: Family;Available  PRN/intermittently;Available 24 hours/day Type of Home: (pt planning to stay with dtr after surgery) Home Access: Level entry     Home Layout: Two level;Able to live on main level with bedroom/bathroom     Bathroom Shower/Tub: Tub/shower unit(sunken tub/shower; will be sponge bathing initially, then plans to use family's walk in shower at another home as needed)   Bathroom Toilet: Standard     Home Equipment: None          Prior Functioning/Environment Level of Independence: Independent        Comments: pt indep with mobility, ADL, driving, no falls in past 12 months        OT Problem List: Decreased strength;Decreased activity tolerance;Decreased safety awareness;Impaired balance (sitting and/or standing);Decreased knowledge of use of DME or AE      OT Treatment/Interventions: Self-care/ADL training;Therapeutic exercise;Therapeutic activities;DME and/or AE instruction;Patient/family education    OT Goals(Current goals can be found in the care plan section) Acute Rehab OT Goals Patient Stated Goal: go home OT Goal Formulation: With patient/family Time For Goal Achievement: 10/15/17 Potential to Achieve Goals: Good ADL Goals Pt Will Perform Lower Body Dressing: with modified independence;sit to/from stand;with adaptive equipment Pt Will Transfer to Toilet: with supervision;ambulating(RW for ambulation, maintaining posterior THPsand PWBing)  OT Frequency: Min 1X/week   Barriers to D/C:            Co-evaluation              AM-PAC PT "6 Clicks" Daily Activity     Outcome Measure Help from another person eating meals?: None Help from another person taking care of personal grooming?: A Little Help from another person toileting, which includes using toliet, bedpan, or urinal?: A Lot Help from another person bathing (including washing, rinsing, drying)?: A Lot Help from another person to put on and taking off regular upper body clothing?: A Little Help from  another person to put on and taking off regular lower body clothing?: A Lot 6 Click Score: 16   End of Session    Activity Tolerance: Patient tolerated treatment well Patient left: in chair;with call [place]/phone within reach;with chair alarm set;with family/visitor present  OT Visit Diagnosis: Other abnormalities of gait and mobility (R26.89)                Time: [id number] OT Time Calculation (min): 20 min Charges:  OT General Charges $OT Visit: 1 Visit OT Evaluation $OT Eval Low Complexity: 1 Low OT Treatments $Self Care/Home Management : 8-22 mins G-Codes: OT G-codes **NOT FOR INPATIENT CLASS** Functional Assessment Tool Used: AM-PAC 6 Clicks Daily Activity;Clinical judgement Functional Limitation: Self care Self Care Current Status (B2841(G8987): At least 40 percent but less than 60 percent impaired, limited or restricted Self Care Goal Status (L2440(G8988): At least 20 percent but less than 40 percent impaired, limited or restricted   Richrd PrimeJamie Stiller, MPH, MS, OTR/L ascom [phone] 10/01/17, 1:29 PM

## 2017-10-01 NOTE — Progress Notes (Signed)
Patient refused abdominal ultrasound.   Harvie HeckMelanie Trace Cederberg, RN

## 2017-10-01 NOTE — Progress Notes (Signed)
Subjective:  DOING WELL.  LESS PAIN.  WANTS TO GO HOME TOMORROW  2 Days Post-Op Procedure(s) (LRB): ARTHROPLASTY BIPOLAR HIP (HEMIARTHROPLASTY) (Right)    Patient reports pain as mild.  Objective:   VITALS:   Vitals:   10/01/17 0611 10/01/17 0816  BP:  (!) 103/56  Pulse:  68  Resp: 18 18  Temp:  98.7 F (37.1 C)  SpO2: 97% 94%    Neurologically intact ABD soft Neurovascular intact Sensation intact distally Intact pulses distally Dorsiflexion/Plantar flexion intact Incision: scant drainage  LABS Recent Labs    09/29/17 0429 09/30/17 0527 10/01/17 0403  HGB 12.3* 9.6* 8.9*  HCT 36.9* 28.8* 26.3*  WBC 14.5* 11.5* 9.2  PLT 166 121* 103*    Recent Labs    09/29/17 0429 09/30/17 0527 10/01/17 0403  NA 139 132* 134*  K 4.0 4.0 3.9  BUN 23* 23* 22*  CREATININE 1.36* 1.33* 1.19  GLUCOSE 117* 119* 114*    No results for input(s): LABPT, INR in the last 72 hours.   Assessment/Plan: 2 Days Post-Op Procedure(s) (LRB): ARTHROPLASTY BIPOLAR HIP (HEMIARTHROPLASTY) (Right)   Advance diet Up with therapy D/C IV fluids Discharge home with home health   D/C ON EC ASA 325MG   BID FOR 6 WEEKS RTC 2 WEEKS FOR XRAYS

## 2017-10-01 NOTE — Progress Notes (Signed)
Patient alert and oriented. Aquacel dressing clean dry and intact. Vital signs stable. Patient denies pain. RN will continue to monitor.   Harvie HeckMelanie Debbi Strandberg, RN

## 2017-10-01 NOTE — Progress Notes (Signed)
[employer] [facility] [facility] [place] [facility]   PATIENT NAME: Alexander Kline    MR#:  [id number]  DATE OF BIRTH:  07/05/1941  SUBJECTIVE:  CHIEF COMPLAINT:   Chief Complaint  Patient presents with  . Fall     Came with a fall and fracture on hip. S/p hemiarthroplasty 09/29/17.   Had fever yesterday noon for last time, nothing since then. Feels better today.  REVIEW OF SYSTEMS:  CONSTITUTIONAL: positive for fever, fatigue or weakness.  EYES: No blurred or double vision.  EARS, NOSE, AND THROAT: No tinnitus or ear pain.  RESPIRATORY: No cough, shortness of breath, wheezing or hemoptysis.  CARDIOVASCULAR: No chest pain, orthopnea, edema.  GASTROINTESTINAL: No nausea, vomiting, diarrhea or abdominal pain.  GENITOURINARY: No dysuria, hematuria.  ENDOCRINE: No polyuria, nocturia,  HEMATOLOGY: No anemia, easy bruising or bleeding SKIN: No rash or lesion. MUSCULOSKELETAL: No joint pain or arthritis.   NEUROLOGIC: No tingling, numbness, weakness.  PSYCHIATRY: No anxiety or depression.   ROS  DRUG ALLERGIES:   Allergies  Allergen Reactions  . Sulfa Antibiotics     Per pt, he gets bad headaches with sulfa drugs    VITALS:  Blood pressure (!) 117/59, pulse 81, temperature 100.2 F (37.9 C), temperature source Oral, resp. rate 18, height 5\' 11"  (1.803 m), weight 81.6 kg (180 lb), SpO2 97 %.  PHYSICAL EXAMINATION:  GENERAL:  76 y.o.-year-old patient lying in the bed with no acute distress.  EYES: Pupils equal, round, reactive to light and accommodation. No scleral icterus. Extraocular muscles intact.  HEENT: Head atraumatic, normocephalic. Oropharynx and nasopharynx clear.  NECK:  Supple, no jugular venous distention. No thyroid enlargement, no tenderness.  LUNGS: Normal breath sounds bilaterally, no wheezing, rales,rhonchi or crepitation. No use of accessory muscles of respiration.  CARDIOVASCULAR: S1, S2 normal. No murmurs, rubs, or gallops.  ABDOMEN: Soft, nontender,  nondistended. Bowel sounds present. No organomegaly or mass.  EXTREMITIES: No pedal edema, cyanosis, or clubbing.  NEUROLOGIC: Cranial nerves II through XII are intact. Muscle strength 4/5 in all extremities. Sensation intact. Gait not checked.  PSYCHIATRIC: The patient is alert and oriented x 3.  SKIN: No obvious rash, lesion, or ulcer.   Physical Exam LABORATORY PANEL:   CBC Recent Labs  Lab 10/01/17 0403  WBC 9.2  HGB 8.9*  HCT 26.3*  PLT 103*   ------------------------------------------------------------------------------------------------------------------  Chemistries  Recent Labs  Lab 10/01/17 0403  NA 134*  K 3.9  CL 107  CO2 23  GLUCOSE 114*  BUN 22*  CREATININE 1.19  CALCIUM 7.7*  AST 42*  ALT 12*  ALKPHOS 58  BILITOT 0.6   ------------------------------------------------------------------------------------------------------------------  Cardiac Enzymes No results for input(s): TROPONINI in the last 168 hours. ------------------------------------------------------------------------------------------------------------------  RADIOLOGY:  Ct Chest Wo Contrast  Result Date: 09/30/2017 CLINICAL DATA:  Fever after hip arthroplasty surgery. EXAM: CT CHEST WITHOUT CONTRAST TECHNIQUE: Multidetector CT imaging of the chest was performed following the standard protocol without IV contrast. COMPARISON:  Chest x-ray on 09/29/2017 FINDINGS: Cardiovascular: The heart size is normal. Calcified plaque is seen in a 3 vessel coronary distribution. No pericardial fluid is identified. The thoracic aorta is normal in caliber. Central pulmonary arteries are also normal in caliber. Mediastinum/Nodes: No evidence of mediastinal mass. Some small mediastinal lymph nodes are identified. Only a single lymph node is borderline in size lying adjacent to the aortic arch and measuring 13 mm in short axis. No definite hilar or axillary lymphadenopathy. Lungs/Pleura: Lungs show trace bilateral  pleural effusions with  bibasilar atelectasis. No focal airspace consolidation, edema, pneumothorax or pulmonary nodule is identified. Upper Abdomen: Atrophic pancreas demonstrating diffuse fatty infiltration. Mildly distended appearance of the gallbladder without evidence of surrounding inflammation or visible gallstones by CT. Musculoskeletal: No chest wall mass or suspicious bone lesions identified. IMPRESSION: 1. No evidence of acute pneumonia by CT. There are trace bilateral pleural effusions with bibasilar atelectasis. 2. Coronary atherosclerosis with calcified plaque in a 3 vessel distribution. 3. Small mediastinal lymph nodes with a single borderline sized prevascular lymph node adjacent to the aortic arch measuring 13 mm. 4. Mildly distended appearance of the gallbladder by CT without evidence of surrounding inflammation or visible gallstones by CT. If there is any right upper quadrant abdominal pain or tenderness, correlation with right upper quadrant abdominal ultrasound may be indicated. Electronically Signed   By: Irish LackGlenn  Yamagata M.D.   On: 09/30/2017 10:55    ASSESSMENT AND PLAN:   Principal Problem:   Hip fracture, unspecified laterality, closed, initial encounter [facility](HCC) Active Problems:   Hip fracture (HCC)  * hypoxia   Have some atelectesis- advise incentive spirometry.   He was advised by me to take heparine [place] for 2 doses only yesterday and suppose to stop last night, as preparing for surgery this morning, but he did not receive it.  Today wanted to do CT angio chest, but he refused of having hypoxia, so did CT without contrast and that does not show pneumonia.   Hypoxia resolved now, encouraged to cont spirometry.  *  fever   May be due to atelactesis.    Checked UA and Xray chest , also send Blood cx.   As fever persist- start on vanc + cefepime.   CT chest- no pneumonia.   Now , no fever for last 24 hrs, had few WBCs in UA yesterday- Abx change to rocephin.  * Right hip  fracture- femoral neck closed fracture.   Pain management and DVT prophylaxis as per ortho.  s/p hemiarthroplasty 09/29/17.  * Hyperlipidemia   Cont atorvastatin.  * Borderline hyperglycemia   Stop metformin due to inpatient and surgery and keep on ISS.  * anemia due to blood loss   Monitor.  PT had suggested SNF, but pt and his daughter are hopeful to go home, likely tomorrow.  All the records are reviewed and case discussed with Care Management/Social Workerr. Management plans discussed with the patient, family and they are in agreement.  CODE STATUS: full.  TOTAL TIME TAKING CARE OF THIS PATIENT: 35 minutes.     POSSIBLE D/C IN 1-2 DAYS, DEPENDING ON CLINICAL CONDITION.   Altamese DillingVaibhavkumar Barret Esquivel M.D on 10/01/2017   Between 7am to 6pm - Pager - [phone]  After 6pm go to [email or web address] - password [email or web address]  [facility] [place] [facility]  Office  [phone]  CC: Primary care physician; Sherron Mondayejan-Sie, S Ahmed, MD  Note: This dictation was prepared with Dragon dictation along with smaller phrase technology. Any transcriptional errors that result from this process are unintentional.

## 2017-10-01 NOTE — Clinical Social Work Note (Signed)
Clinical Social Work Assessment  Patient Details  Name: Alexander Kline. MRN: [id number] Date of Birth: [birth year]  Date of referral:  10/01/17               Reason for consult:  Facility Placement                Permission sought to share information with:  Chartered certified accountant granted to share information::  Yes, Verbal Permission Granted  Name::      [place]::   [place]  Relationship::     Contact Information:     Housing/Transportation Living arrangements for the past 2 months:  [place] of Information:  Patient, Adult Children, Spouse Patient Interpreter Needed:  None Criminal Activity/Legal Involvement Pertinent to Current Situation/Hospitalization:  No - Comment as needed Significant Relationships:  Adult Children, Spouse Lives with:  Spouse Do you feel safe going back to the place where you live?  Yes Need for family participation in patient care:  Yes (Comment)  Care giving concerns:  Patient lives in [place] with his wife Alexander Kline.    Social Worker assessment / plan:  Holiday representative (CSW) received SNF consult. PT is recommending SNF. CSW met with patient and his wife Alexander Kline and daughter Alexander Kline were at bedside. Patient was alert and oriented X4 and was sitting up in the chair at bedside. CSW introduced self and explained role of CSW department. Patient reported that he lives in [place] with his wife and plans on going to his daughter's house. Per Alexander Kline she will take patient home with her in [place] with home health. CSW explained SNF process and that benefits of SNF. Patient reported that he would consider SNF as plan B if needed. FL2 complete and faxed.   CSW presented bed offers to patient. Per patient if he has to go to SNF then he will go to [facility]. [facility] admissions coordinator at [facility] is aware of above. CSW will continue to follow and assist as needed.    Employment status:  Retired Nurse, adult PT Recommendations:  [place] / Referral to community resources:  [place]  Patient/Family's Response to care:  Patient prefers to go home but will consider SNF.   Patient/Family's Understanding of and Emotional Response to Diagnosis, Current Treatment, and Prognosis:  Patient and his wife and daughter were very pleasant and thanked CSW for assistance.   Emotional Assessment Appearance:  Appears stated age Attitude/Demeanor/Rapport:    Affect (typically observed):  Accepting, Adaptable, Pleasant Orientation:  Oriented to Self, Oriented to Place, Oriented to  Time, Oriented to Situation Alcohol / Substance use:  Not Applicable Psych involvement (Current and /or in the community):  No (Comment)  Discharge Needs  Concerns to be addressed:  Discharge Planning Concerns Readmission within the last 30 days:  No Current discharge risk:  Dependent with Mobility Barriers to Discharge:  Continued Medical Work up   [employer], Veronia Beets, LCSW 10/01/2017, 1:55 PM

## 2017-10-01 NOTE — Progress Notes (Signed)
Physical Therapy Treatment Patient Details Name: Alexander MeadowRaymond E Bellavance Jr. MRN: [id number] DOB: 02/27/1941 Today's Date: 10/01/2017    History of Present Illness Pt is a 76 y.o. male who was admitted 09/28/2017 with a diagnosis of displaced subcapital fracture of the hip and elected for surgical management.  Pt s/p R hip hemiarthroplasty 09/29/17.    PT Comments    Pt required max assist supine to sit and mod to max assist to stand from bed with RW.  Pt able to stand with min assist from recliner (pushing off B armrests) with consistent vc's for technique and use of RW.  Able to progress to ambulating 20 feet with RW; distance limited d/t fatigue.  Pt required vc's and education during session for posterior hip precautions and partial WB'ing status.  Educated pt and pt's family on recommendations for STR; pt and pt's family would like pt to discharge to his daughter's home (level entry townhouse).  Will continue to progress pt with strengthening, ambulation distance, and decreasing assist levels with bed mobility and transfers.   Follow Up Recommendations  SNF     Equipment Recommendations  Rolling walker with 5" wheels;3in1 (PT)    Recommendations for Other Services       Precautions / Restrictions Precautions Precautions: Posterior Hip;Fall Restrictions Weight Bearing Restrictions: Yes RLE Weight Bearing: Partial weight bearing Other Position/Activity Restrictions: Bucks traction    Mobility  Bed Mobility Overal bed mobility: Needs Assistance Bed Mobility: Supine to Sit     Supine to sit: Max assist     General bed mobility comments: assist for R LE supine to sit (initial vc's to bridge with L LE and scoot to edge of bed; assist for trunk; increased effort and time to perform)  Transfers Overall transfer level: Needs assistance Equipment used: Rolling walker (2 wheeled) Transfers: Sit to/from Stand Sit to Stand: Mod assist;Max assist;Min assist         General transfer  comment: mod to max assist sit to stand from bed; min assist to stand from recliner (with 2 attempts); vc's required for R LE positioning (posterior THP's), UE positioning, and technique  Ambulation/Gait Ambulation/Gait assistance: Min assist Ambulation Distance (Feet): 20 Feet Assistive device: Rolling walker (2 wheeled) Gait Pattern/deviations: Step-to pattern Gait velocity: decreased   General Gait Details: intermittent vc's to for gait pattern, increasing UE support through RW to maintain PWB'ing status; and walker use; limited distance d/t fatigue   Stairs            Wheelchair Mobility    Modified Rankin (Stroke Patients Only)       Balance Overall balance assessment: Needs assistance Sitting-balance support: Bilateral upper extremity supported;Feet supported Sitting balance-Leahy Scale: Poor Sitting balance - Comments: requires UE support on bed for sitting balance   Standing balance support: Bilateral upper extremity supported Standing balance-Leahy Scale: Poor Standing balance comment: requires UE support on RW for static standing balance                            Cognition Arousal/Alertness: Awake/alert Behavior During Therapy: WFL for tasks assessed/performed Overall Cognitive Status: Within Functional Limits for tasks assessed                                        Exercises Total Joint Exercises Ankle Circles/Pumps: AROM;Strengthening;Both;10 reps;Supine Quad Sets: AROM;Strengthening;Both;10 reps;Supine Short Arc Quad:  AROM;Strengthening;Right;10 reps;Supine Heel Slides: AAROM;Strengthening;Right;10 reps;Supine Hip ABduction/ADduction: AAROM;Strengthening;Right;10 reps;Supine    General Comments General comments (skin integrity, edema, etc.): Pt resting in bed upon PT entry; pt's wife and daughter present.  Nursing cleared pt for participation in physical therapy.  Pt agreeable to PT session.      Pertinent Vitals/Pain  Pain Assessment: 0-10 Pain Score: 2  Pain Location: Right hip Pain Descriptors / Indicators: Grimacing;Sore Pain Intervention(s): Limited activity within patient's tolerance;Monitored during session;Premedicated before session;Repositioned  Vitals (HR and O2 on room air) stable and WFL throughout treatment session.    Home Living                      Prior Function            PT Goals (current goals can now be found in the care plan section) Acute Rehab PT Goals Patient Stated Goal: to walk PT Goal Formulation: With patient Time For Goal Achievement: 10/19/17 Potential to Achieve Goals: Good Progress towards PT goals: Progressing toward goals    Frequency    BID      PT Plan Current plan remains appropriate    Co-evaluation              AM-PAC PT "6 Clicks" Daily Activity  Outcome Measure  Difficulty turning over in bed (including adjusting bedclothes, sheets and blankets)?: Unable Difficulty moving from lying on back to sitting on the side of the bed? : Unable Difficulty sitting down on and standing up from a chair with arms (e.g., wheelchair, bedside commode, etc,.)?: Unable Help needed moving to and from a bed to chair (including a wheelchair)?: A Lot Help needed walking in hospital room?: A Little Help needed climbing 3-5 steps with a railing? : A Lot 6 Click Score: 10    End of Session Equipment Utilized During Treatment: Gait belt Activity Tolerance: Patient limited by fatigue Patient left: in chair;with call Armstrong/phone within reach;with chair alarm set;with nursing/sitter in room;with family/visitor present(pt declined SCD's (even with education on purpose/benefits); B heels elevated via pillows and pillows between LE's to help maintain posterior THP's) Nurse Communication: Mobility status;Precautions;Weight bearing status PT Visit Diagnosis: Pain;Difficulty in walking, not elsewhere classified (R26.2);Muscle weakness (generalized) (M62.81) Pain  - Right/Left: Right Pain - part of body: Hip     Time: 0920-1014 PT Time Calculation (min) (ACUTE ONLY): 54 min  Charges:  $Gait Training: 8-22 mins $Therapeutic Exercise: 8-22 mins $Therapeutic Activity: 23-37 mins                    G CodesHendricks Kline:       Alexander Kline, PT 10/01/17, 10:54 AM [phone]

## 2017-10-01 NOTE — Clinical Social Work Placement (Signed)
   CLINICAL SOCIAL WORK PLACEMENT  NOTE  Date:  10/01/2017  Patient Details  Name: Alexander MeadowRaymond E Burgio Jr. MRN: [id number] Date of Birth: 09/01/1941  Clinical Social Work is seeking post-discharge placement for this patient at the Skilled  Nursing Facility level of care (*CSW will initial, date and re-position this form in  chart as items are completed):  Yes   Patient/family provided with [place] Clinical Social Work Department's list of facilities offering this level of care within the geographic area requested by the patient (or if unable, by the patient's family).  Yes   Patient/family informed of their freedom to choose among providers that offer the needed level of care, that participate in Medicare, Medicaid or managed care program needed by the patient, have an available bed and are willing to accept the patient.  Yes   Patient/family informed of 's ownership interest in [facility] and [facility], as well as of the fact that they are under no obligation to receive care at these facilities.  PASRR submitted to EDS on 10/01/17     PASRR number received on 10/01/17     Existing PASRR number confirmed on       FL2 transmitted to all facilities in geographic area requested by pt/family on 10/01/17     FL2 transmitted to all facilities within larger geographic area on       Patient informed that his/her managed care company has contracts with or will negotiate with certain facilities, including the following:        Yes   Patient/family informed of bed offers received.  Patient chooses bed at [facility] )     Physician recommends and patient chooses bed at      Patient to be transferred to   on  .  Patient to be transferred to facility by       Patient family notified on   of transfer.  Name of family member notified:        PHYSICIAN       Additional Comment:    _______________________________________________ Denvil Canning, Darleen CrockerBailey M,  LCSW 10/01/2017, 1:54 PM

## 2017-10-01 NOTE — Progress Notes (Signed)
OT Cancellation Note  Patient Details Name: Alexander MeadowRaymond E Alessandrini Jr. MRN: [id number] DOB: 04/14/1941   Cancelled Treatment:    Reason Eval/Treat Not Completed: Patient at procedure or test/ unavailable. Order received, chart reviewed. Pt unavailable for OT evaluation at this time, receiving abdominal ultrasound. Will re-attempt at later time as pt is available.  Alexander Kline, MPH, MS, OTR/L ascom [phone] 10/01/17, 8:39 AM

## 2017-10-02 LAB — URINE CULTURE: Culture: NO GROWTH

## 2017-10-02 LAB — CBC
HCT: 23.3 % — ABNORMAL LOW (ref 40.0–52.0)
Hemoglobin: 8 g/dL — ABNORMAL LOW (ref 13.0–18.0)
MCH: 31 pg (ref 26.0–34.0)
MCHC: 34.2 g/dL (ref 32.0–36.0)
MCV: 90.6 fL (ref 80.0–100.0)
PLATELETS: 113 10*3/uL — AB (ref 150–440)
RBC: 2.57 MIL/uL — AB (ref 4.40–5.90)
RDW: 14.3 % (ref 11.5–14.5)
WBC: 7.7 10*3/uL (ref 3.8–10.6)

## 2017-10-02 LAB — GLUCOSE, CAPILLARY
GLUCOSE-CAPILLARY: 123 mg/dL — AB (ref 65–99)
Glucose-Capillary: 108 mg/dL — ABNORMAL HIGH (ref 65–99)

## 2017-10-02 MED ORDER — FERROUS SULFATE 325 (65 FE) MG PO TABS: 325.0000 mg | ORAL_TABLET | Freq: Two times a day (BID) | ORAL | 2 refills | Status: DC

## 2017-10-02 MED ORDER — PANTOPRAZOLE SODIUM 40 MG PO TBEC: 40.0000 mg | DELAYED_RELEASE_TABLET | Freq: Every day | ORAL | 1 refills | Status: DC

## 2017-10-02 MED ORDER — ASPIRIN EC 325 MG PO TBEC: 325.0000 mg | DELAYED_RELEASE_TABLET | Freq: Two times a day (BID) | ORAL | 0 refills | Status: AC

## 2017-10-02 MED ORDER — ACETAMINOPHEN 325 MG PO TABS: 650.0000 mg | ORAL_TABLET | Freq: Four times a day (QID) | ORAL | 0 refills | Status: DC | PRN

## 2017-10-02 MED ORDER — CEFUROXIME AXETIL 250 MG PO TABS: 250.0000 mg | ORAL_TABLET | Freq: Two times a day (BID) | ORAL | 0 refills | Status: AC

## 2017-10-02 NOTE — Care Management Important Message (Signed)
Important Message  Patient Details  Name: Alexander MeadowRaymond E Hiltunen Jr. MRN: [id number] Date of Birth: 05/11/1941   Medicare Important Message Given:  Yes    Marily MemosLisa M Kele Barthelemy, RN 10/02/2017, 11:24 AM

## 2017-10-02 NOTE — Discharge Summary (Signed)
[facility] [place] [facility]   PATIENT NAME: Alexander Kline    MR#:  [id number]  DATE OF BIRTH:  02/27/1941  DATE OF ADMISSION:  09/28/2017 ADMITTING PHYSICIAN: Altamese DillingVaibhavkumar Adoria Kawamoto, MD  DATE OF DISCHARGE: 10/02/2017  1:45 PM  PRIMARY CARE PHYSICIAN: Sherron Mondayejan-Sie, S Ahmed, MD    ADMISSION DIAGNOSIS:  Fall [W19.XXXA] Closed right hip fracture, initial encounter (HCC) [S72.001A] Skin tear of left hand without complication, initial encounter [S61.412A]  DISCHARGE DIAGNOSIS:  Principal Problem:   Hip fracture, unspecified laterality, closed, [facility]) Active Problems:   Hip fracture (HCC)   SECONDARY DIAGNOSIS:   Past Medical History:  Diagnosis Date  . Hyperlipidemia   . Pre-diabetes     HOSPITAL COURSE:   * hypoxia   Have some atelectesis- advise incentive spirometry. CT showed some atelactesis.     Hypoxia resolved now, encouraged to cont spirometry.  *  fever   May be due to atelactesis.    Checked UA and Xray chest , also send Blood cx.   As fever persist- start on vanc + cefepime.   CT chest- no pneumonia.   Now , no fever for last 24 hrs, had few WBCs in UA yesterday- Abx change to rocephin.   Give 3 days of cefuroxime on d/c.  * Right hip fracture- femoral neck closed fracture. Pain management and DVT prophylaxis as per ortho. s/p hemiarthroplasty 09/29/17.   Participated with PT, suggestion was for rehab, but pt wanted to go home.  * Hyperlipidemia Cont atorvastatin.  * Borderline hyperglycemia Stop metformin due to inpatient and surgery and keep on ISS.  * acute anemia due to blood loss   Monitor.  PT had suggested SNF, but pt and his daughter are hopeful to go home.  DISCHARGE CONDITIONS:   Stable.  CONSULTS OBTAINED:  Treatment Team:  Deeann SaintMiller, Howard, MD  DRUG ALLERGIES:   Allergies  Allergen Reactions  . Sulfa Antibiotics     Per pt, he gets bad headaches with sulfa drugs     DISCHARGE MEDICATIONS:   Allergies as of 10/02/2017      Reactions   Sulfa Antibiotics    Per pt, he gets bad headaches with sulfa drugs      Medication List    TAKE these medications   acetaminophen 325 MG tablet Commonly known as:  TYLENOL Take 2 tablets (650 mg total) by mouth every 6 (six) hours as needed for mild pain (or Fever >/= 101).   aspirin EC 325 MG tablet Take 1 tablet (325 mg total) by mouth every 12 (twelve) hours. What changed:    medication strength  how much to take  when to take this   ATORVASTATIN CALCIUM PO Take 1 tablet by mouth daily.   cefUROXime 250 MG tablet Commonly known as:  CEFTIN Take 1 tablet (250 mg total) by mouth 2 (two) times daily for 3 days.   ferrous sulfate 325 (65 FE) MG tablet Take 1 tablet (325 mg total) by mouth 2 (two) times daily with a meal.   metFORMIN 500 MG tablet Commonly known as:  GLUCOPHAGE Take 500 mg by mouth daily with breakfast.   pantoprazole 40 MG tablet Commonly known as:  PROTONIX Take 1 tablet (40 mg total) by mouth daily.        DISCHARGE INSTRUCTIONS:    Follow with ortho clinic in 2 week.  If you experience worsening of your admission symptoms, develop shortness of breath, life threatening emergency, suicidal or homicidal  thoughts you must seek medical attention immediately by calling 911 or calling your MD immediately  if symptoms less severe.  You Must read complete instructions/literature along with all the possible adverse reactions/side effects for all the Medicines you take and that have been prescribed to you. Take any new Medicines after you have completely understood and accept all the possible adverse reactions/side effects.   Please note  You were cared for by a hospitalist during your hospital stay. If you have any questions about your discharge medications or the care you received while you were in the hospital after you are discharged, you can call the unit and asked to  speak with the hospitalist on call if the hospitalist that took care of you is not available. Once you are discharged, your primary care physician will handle any further medical issues. Please note that NO REFILLS for any discharge medications will be authorized once you are discharged, as it is imperative that you return to your primary care physician (or establish a relationship with a primary care physician if you do not have one) for your aftercare needs so that they can reassess your need for medications and monitor your lab values.    Today   CHIEF COMPLAINT:   Chief Complaint  Patient presents with  . Fall    HISTORY OF PRESENT ILLNESS:  Alexander NipRaymond Polidori  is a 76 y.o. 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 Today , while pulling the garage door open- had a string attached to it, and it got tangled in his leg and he fell- his right hip hit the concrete floor. In ER noted to have right hip- femoral neck fracture. Ortho planning for surgery tomorrow.   VITAL SIGNS:  Blood pressure 122/72, pulse 82, temperature 99.1 F (37.3 C), temperature source Oral, resp. rate 18, height 5\' 11"  (1.803 m), weight 81.6 kg (180 lb), SpO2 96 %.  I/O:    Intake/Output Summary (Last 24 hours) at 10/02/2017 2253 Last data filed at 10/02/2017 0900 Gross per 24 hour  Intake 120 ml  Output 675 ml  Net -555 ml    PHYSICAL EXAMINATION:  GENERAL:  76 y.o.-year-old patient lying in the bed with no acute distress.  EYES: Pupils equal, round, reactive to light and accommodation. No scleral icterus. Extraocular muscles intact.  HEENT: Head atraumatic, normocephalic. Oropharynx and nasopharynx clear.  NECK:  Supple, no jugular venous distention. No thyroid enlargement, no tenderness.  LUNGS: Normal breath sounds bilaterally, no wheezing, rales,rhonchi or crepitation. No use of accessory muscles  of respiration.  CARDIOVASCULAR: S1, S2 normal. No murmurs, rubs, or gallops.  ABDOMEN: Soft, non-tender, non-distended. Bowel sounds present. No organomegaly or mass.  EXTREMITIES: No pedal edema, cyanosis, or clubbing.  NEUROLOGIC: Cranial nerves II through XII are intact. Muscle strength 5/5 in all extremities. Sensation intact. Gait not checked.  PSYCHIATRIC: The patient is alert and oriented x 3.  SKIN: No obvious rash, lesion, or ulcer.   DATA REVIEW:   CBC Recent Labs  Lab 10/02/17 0359  WBC 7.7  HGB 8.0*  HCT 23.3*  PLT 113*    Chemistries  Recent Labs  Lab 10/01/17 0403  NA 134*  K 3.9  CL 107  CO2 23  GLUCOSE 114*  BUN 22*  CREATININE 1.19  CALCIUM 7.7*  AST 42*  ALT 12*  ALKPHOS 58  BILITOT 0.6    Cardiac  Enzymes No results for input(s): TROPONINI in the last 168 hours.  Microbiology Results  Results for orders placed or performed during the hospital encounter of 09/28/17  Surgical PCR screen     Status: Abnormal   Collection Time: 09/28/17  5:43 PM  Result Value Ref Range Status   MRSA, PCR NEGATIVE NEGATIVE Final   Staphylococcus aureus POSITIVE (A) NEGATIVE Final    Comment: (NOTE) The Xpert SA Assay (FDA approved for NASAL specimens in patients 5 years of age and older), is one component of a comprehensive surveillance program. It is not intended to diagnose infection nor to guide or monitor treatment.   CULTURE, BLOOD (ROUTINE X 2) w Reflex to ID Panel     Status: None (Preliminary result)   Collection Time: 09/29/17  8:06 AM  Result Value Ref Range Status   Specimen Description BLOOD RIGHT ANTECUBITAL  Final   Special Requests   Final    BOTTLES DRAWN AEROBIC AND ANAEROBIC Blood Culture adequate volume   Culture NO GROWTH 3 DAYS  Final   Report Status PENDING  Incomplete  CULTURE, BLOOD (ROUTINE X 2) w Reflex to ID Panel     Status: None (Preliminary result)   Collection Time: 09/29/17  8:13 AM  Result Value Ref Range Status    Specimen Description BLOOD RESISTANT HAND  Final   Special Requests   Final    BOTTLES DRAWN AEROBIC AND ANAEROBIC Blood Culture adequate volume   Culture NO GROWTH 3 DAYS  Final   Report Status PENDING  Incomplete  Urine Culture     Status: None   Collection Time: 09/30/17  1:40 PM  Result Value Ref Range Status   Specimen Description URINE, RANDOM  Final   Special Requests NONE  Final   Culture   Final    NO GROWTH Performed at [facility], [address], [place], [place] [address]    Report Status 10/02/2017 FINAL  Final    RADIOLOGY:  No results found.  EKG:  No orders found for this or any previous visit.    Management plans discussed with the patient, family and they are in agreement.  CODE STATUS:  Code Status History    Date Active Date Inactive Code Status Order ID Comments User Context   09/28/2017 16:56 10/02/2017 16:50 Full Code [id number]  Altamese Dilling, MD Inpatient    Advance Directive Documentation     Most Recent Value  Type of Advance Directive  Living will, Healthcare Power of Attorney  Pre-existing out of facility DNR order (yellow form or pink MOST form)  No data  "MOST" Form in Place?  No data      TOTAL TIME TAKING CARE OF THIS PATIENT: 35 minutes.    Altamese Dilling M.D on 10/02/2017 at 10:53 PM  Between 7am to 6pm - Pager - [phone]  After 6pm go to [email or web address] - password [email or web address]  [facility]  Office  [phone]  CC: Primary care physician; Sherron Monday, MD   Note: This dictation was prepared with Dragon dictation along with smaller phrase technology. Any transcriptional errors that result from this process are unintentional.

## 2017-10-02 NOTE — Progress Notes (Signed)
Physical Therapy Treatment Patient Details Name: Alexander MeadowRaymond E Lordi Jr. MRN: [id number] DOB: 06/16/1941 Today's Date: 10/02/2017    History of Present Illness Pt is a 76 y.o. male who was admitted 09/28/2017 with a diagnosis of displaced subcapital fracture of the hip and elected for surgical management.  Pt s/p R hip hemiarthroplasty 09/29/17.    PT Comments    Pt able to progress to ambulating 50 feet with RW CGA (limited d/t fatigue).  Pt's daughter present during session and demonstrated appropriate assist technique, assist levels, and vc's required for safe transfers and ambulation during session (including WB'ing status and posterior hip precautions).  Pt's daughter also reports she has been helping pt with bed mobility (supine to/from sit) and reports no concerns regarding this.  Issued LE HEP including hip precautions and car transfer technique:  Pt's daughter reports no questions regarding this.  Pt appears safe to discharge to daughter's home with 24/7 assist of daughter and HHPT.  All of pt's and pt's daughter's questions answered during session and all verbalizing good understanding.  Plan to discharge to daughter's home today.    Follow Up Recommendations  Home health PT;Supervision/Assistance - 24 hour     Equipment Recommendations  Rolling walker with 5" wheels;3in1 (PT)    Recommendations for Other Services       Precautions / Restrictions Precautions Precautions: Posterior Hip;Fall Precaution Booklet Issued: Yes (comment) Precaution Comments: Reviewed posterior hip precautions during session. Restrictions Weight Bearing Restrictions: Yes RLE Weight Bearing: Partial weight bearing Other Position/Activity Restrictions: Bucks traction    Mobility  Bed Mobility Overal bed mobility: Needs Assistance Bed Mobility: Supine to Sit     Supine to sit: Max assist     General bed mobility comments: pt able to move B LE's towards edge of bed with a lot of effort and increased  time; required max assist for trunk to sit on edge of bed; vc's for technique  Transfers Overall transfer level: Needs assistance Equipment used: Rolling walker (2 wheeled) Transfers: Sit to/from Stand Sit to Stand: Min assist;Mod assist         General transfer comment: pt able to stand from bed with min to mod assist and from recliner with minimal assistance using RW; occasional vc's for posterior hip precautions and transfer technique  Ambulation/Gait Ambulation/Gait assistance: Min guard Ambulation Distance (Feet): 50 Feet Assistive device: Rolling walker (2 wheeled) Gait Pattern/deviations: Step-to pattern Gait velocity: decreased   General Gait Details: intermittent vc's for gait pattern and increasing UE support through RW to maintain PWB'ing status; occasional vc's for walker use; limited distance d/t fatigue   Stairs Stairs: (Deferred--pt does not have stairs to enter daughter's home)          Wheelchair Mobility    Modified Rankin (Stroke Patients Only)       Balance Overall balance assessment: Needs assistance Sitting-balance support: Feet supported Sitting balance-Leahy Scale: Good Sitting balance - Comments: sitting reaching within BOS stable   Standing balance support: Bilateral upper extremity supported(on RW) Standing balance-Leahy Scale: Poor Standing balance comment: requires minimal UE support on RW for static standing balance                            Cognition Arousal/Alertness: Awake/alert Behavior During Therapy: WFL for tasks assessed/performed Overall Cognitive Status: Within Functional Limits for tasks assessed  Exercises     General Comments General comments (skin integrity, edema, etc.): Pt's wife and daughter present during session.  Pt agreeable to PT session.  RW delivered during session and fit to pt.      Pertinent Vitals/Pain Pain Assessment: 0-10 Pain  Score: 2  Pain Location: Right hip Pain Descriptors / Indicators: Grimacing;Sore Pain Intervention(s): Limited activity within patient's tolerance;Monitored during session;Premedicated before session;Repositioned  Vitals (HR and O2 on room air) stable and WFL throughout treatment session.    Home Living                      Prior Function            PT Goals (current goals can now be found in the care plan section) Acute Rehab PT Goals Patient Stated Goal: go home PT Goal Formulation: With patient/family Time For Goal Achievement: 10/19/17 Potential to Achieve Goals: Fair Progress towards PT goals: Progressing toward goals    Frequency    BID      PT Plan Discharge plan needs to be updated    Co-evaluation              AM-PAC PT "6 Clicks" Daily Activity  Outcome Measure  Difficulty turning over in bed (including adjusting bedclothes, sheets and blankets)?: Unable Difficulty moving from lying on back to sitting on the side of the bed? : Unable Difficulty sitting down on and standing up from a chair with arms (e.g., wheelchair, bedside commode, etc,.)?: Unable Help needed moving to and from a bed to chair (including a wheelchair)?: A Lot Help needed walking in hospital room?: A Little Help needed climbing 3-5 steps with a railing? : A Lot 6 Click Score: 10    End of Session Equipment Utilized During Treatment: Gait belt Activity Tolerance: Patient limited by fatigue Patient left: in chair;with call [place]/phone within reach;with chair alarm set;with family/visitor present(pt declined SCD's; B heels elevated via pillows; pillows placed between pt's knees for posterior hip precautions) Nurse Communication: Mobility status;Precautions;Weight bearing status PT Visit Diagnosis: Pain;Difficulty in walking, not elsewhere classified (R26.2);Muscle weakness (generalized) (M62.81) Pain - Right/Left: Right Pain - part of body: Hip     Time: 4098-11910948-1035 PT Time  Calculation (min) (ACUTE ONLY): 47 min  Charges:  $Gait Training: 8-22 mins $Therapeutic Activity: 23-37 mins                    G CodesHendricks Limes:       Statia Burdick, PT 10/02/17, 11:51 AM [phone]

## 2017-10-02 NOTE — Progress Notes (Signed)
PT is now recommending home health 24/7 supervision. RN case manager aware of above. Please reconsult if future social work needs arise. CSW signing off.   Baker Hughes IncorporatedBailey Mcdaniel Ohms, LCSW [phone]

## 2017-10-02 NOTE — Discharge Planning (Signed)
Patient's IV removed.  RN assessment and VS revealed stability [facility]  Hip Dressing changed. Discharge papers printed, explained and educated.  Informed of suggested FU appt and appt made. Scripts given and others also e-scribed to [facility] [address] ([place], ready, will be wheeled to front and family transporting home via car.

## 2017-10-02 NOTE — Care Management Note (Signed)
Case Management Note  Patient Details  Name: Alexander MeadowRaymond E Rafferty Jr. MRN: [id number] Date of Birth: 10/03/1941  Subjective/Objective:    Discharging today                Action/Plan: Dan HumphreysWalker delivered. BSC to be delivered to the home. [facility] notified of discharge. OT being added to home health orders.    Expected Discharge Date:  10/02/17               Expected Discharge Plan:  Home w Home Health Services  In-House Referral:  Clinical Social Work  Discharge planning Services  CM Consult  Post Acute Care Choice:  Durable Medical Equipment, Home Health Choice offered to:  Adult Children, Spouse  DME Arranged:  Bedside commode, Walker rolling DME Agency:  [facility]  HH Arranged:  PT, OT HH Agency:  [facility]  Status of Service:  Completed, signed off  If discussed at Long Length of Stay Meetings, dates discussed:    Additional Comments:  Alexander MemosLisa M Jemell Town, RN 10/02/2017, 11:19 AM

## 2017-10-02 NOTE — Progress Notes (Signed)
Pt has intermittently confusion this shift. Bisacodyl suppository administered with results

## 2017-10-02 NOTE — Progress Notes (Signed)
Occupational Therapy Treatment Patient Details Name: Alexander Kline. MRN: [id number] DOB: 12/09/1940 Today's Date: 10/02/2017    History of present illness Pt is a 76 y.o. male who was admitted 09/28/2017 with a diagnosis of displaced subcapital fracture of the hip and elected for surgical management.  Pt s/p R hip hemiarthroplasty 09/29/17.   OT comments  Pt seen for OT treatment session focused on BUE strengthening to support functional mobility and self care skills while minimizing falls risk and maximizing functional independence with ADL tasks. Pt/spouse/dtr educated in HEP for BUE including 1x10, 3x/day for shoulder flexion, abduction, elevation, elbow flexion/extension. Handout with written instructions and visual demonstrations provided. Also provided handout with written and visual instructions for posterior total hip precautions to maximize adherence and carryover.  Pt/spouse/dtr verbalized understanding of all education/training provided. Pt would continue to benefit from skilled OT services to address noted impairments and functional deficits (see below for detail) in order to maximize return to PLOF. RNCM notified of change in discharge recommendation.    Follow Up Recommendations  Home health OT    Equipment Recommendations  3 in 1 bedside commode;Other (comment)(reacher)    Recommendations for Other Services      Precautions / Restrictions Precautions Precautions: Posterior Hip;Fall Precaution Booklet Issued: Yes (comment) Precaution Comments: Pt/family educated on handout on posterior total hip precautions with visuals to support understanding/carryover. Restrictions Weight Bearing Restrictions: Yes RLE Weight Bearing: Partial weight bearing Other Position/Activity Restrictions: Bucks traction       Mobility Bed Mobility               General bed mobility comments: Deferred d/t pt up in recliner beginning and end of session.  Transfers                      Balance                                           ADL either performed or assessed with clinical judgement   ADL                                               Vision Baseline Vision/History: Wears glasses Wears Glasses: At all times Patient Visual Report: No change from baseline     Perception     Praxis      Cognition Arousal/Alertness: Awake/alert Behavior During Therapy: WFL for tasks assessed/performed Overall Cognitive Status: Within Functional Limits for tasks assessed                                          Exercises Other Exercises Other Exercises: Pt/spouse/dtr educated in HEP for BUE including 1x10, 3x/day for shoulder flexion, abduction, elevation, elbow flexion/extension. Handout with written instructions and visual demonstrations provided.    Shoulder Instructions       General Comments      Pertinent Vitals/ Pain          Home Living  Prior Functioning/Environment              Frequency  Min 1X/week        Progress Toward Goals  OT Goals(current goals can now be found in the care plan section)  Progress towards OT goals: Progressing toward goals  Acute Rehab OT Goals Patient Stated Goal: go home OT Goal Formulation: With patient/family Time For Goal Achievement: 10/15/17 Potential to Achieve Goals: Good  Plan Frequency remains appropriate;Discharge plan needs to be updated    Co-evaluation                 AM-PAC PT "6 Clicks" Daily Activity     Outcome Measure   Help from another person eating meals?: None Help from another person taking care of personal grooming?: None Help from another person toileting, which includes using toliet, bedpan, or urinal?: A Little Help from another person bathing (including washing, rinsing, drying)?: A Little Help from another person to put on and taking off regular  upper body clothing?: A Little Help from another person to put on and taking off regular lower body clothing?: A Little 6 Click Score: 20    End of Session    OT Visit Diagnosis: Other abnormalities of gait and mobility (R26.89)   Activity Tolerance Patient tolerated treatment well   Patient Left in chair;with call [place]/phone within reach;with chair alarm set;with family/visitor present   Nurse Communication          Time: [id number] OT Time Calculation (min): 10 min  Charges: OT General Charges $OT Visit: 1 Visit OT Treatments $Therapeutic Exercise: 8-22 mins  Richrd PrimeJamie Stiller, MPH, MS, OTR/L ascom [phone] 10/02/17, 11:26 AM

## 2017-10-03 LAB — SURGICAL PATHOLOGY

## 2017-10-04 LAB — CULTURE, BLOOD (ROUTINE X 2)
CULTURE: NO GROWTH
CULTURE: NO GROWTH
Special Requests: ADEQUATE
Special Requests: ADEQUATE

## 2018-02-14 DIAGNOSIS — M7061 Trochanteric bursitis, right hip: Secondary | ICD-10-CM | POA: Insufficient documentation

## 2018-03-07 ENCOUNTER — Emergency Department
Admission: EM | Admit: 2018-03-07 | Discharge: 2018-03-07 | Disposition: A | Discharge status: Home / Self Care | Payer: Medicare Other | Attending: Emergency Medicine | Admitting: Emergency Medicine

## 2018-03-07 ENCOUNTER — Encounter: Payer: Self-pay | Admitting: Emergency Medicine

## 2018-03-07 ENCOUNTER — Emergency Department: Payer: Medicare Other

## 2018-03-07 DIAGNOSIS — R42 Dizziness and giddiness: Secondary | ICD-10-CM | POA: Diagnosis present

## 2018-03-07 DIAGNOSIS — R7303 Prediabetes: Secondary | ICD-10-CM | POA: Diagnosis not present

## 2018-03-07 DIAGNOSIS — Z96641 Presence of right artificial hip joint: Secondary | ICD-10-CM | POA: Insufficient documentation

## 2018-03-07 DIAGNOSIS — Z7984 Long term (current) use of oral hypoglycemic drugs: Secondary | ICD-10-CM | POA: Insufficient documentation

## 2018-03-07 LAB — CBC
HEMATOCRIT: 38.5 % — AB (ref 40.0–52.0)
HEMOGLOBIN: 13.1 g/dL (ref 13.0–18.0)
MCH: 29.9 pg (ref 26.0–34.0)
MCHC: 34.2 g/dL (ref 32.0–36.0)
MCV: 87.4 fL (ref 80.0–100.0)
Platelets: 223 10*3/uL (ref 150–440)
RBC: 4.4 MIL/uL (ref 4.40–5.90)
RDW: 17.2 % — ABNORMAL HIGH (ref 11.5–14.5)
WBC: 9.8 10*3/uL (ref 3.8–10.6)

## 2018-03-07 LAB — COMPREHENSIVE METABOLIC PANEL
ALK PHOS: 79 U/L (ref 38–126)
ALT: 21 U/L (ref 17–63)
AST: 24 U/L (ref 15–41)
Albumin: 3.8 g/dL (ref 3.5–5.0)
Anion gap: 8 (ref 5–15)
BILIRUBIN TOTAL: 0.7 mg/dL (ref 0.3–1.2)
BUN: 25 mg/dL — ABNORMAL HIGH (ref 6–20)
CALCIUM: 9.3 mg/dL (ref 8.9–10.3)
CO2: 27 mmol/L (ref 22–32)
CREATININE: 1.31 mg/dL — AB (ref 0.61–1.24)
Chloride: 103 mmol/L (ref 101–111)
GFR calc Af Amer: 58 mL/min — ABNORMAL LOW (ref >60)
GFR calc non Af Amer: 50 mL/min — ABNORMAL LOW (ref >60)
GLUCOSE: 128 mg/dL — AB (ref 65–99)
Potassium: 4.7 mmol/L (ref 3.5–5.1)
Sodium: 138 mmol/L (ref 135–145)
Total Protein: 6.9 g/dL (ref 6.5–8.1)

## 2018-03-07 LAB — DIFFERENTIAL
Basophils Absolute: 0.1 10*3/uL (ref 0–0.1)
Basophils Relative: 1 %
EOS PCT: 4 %
Eosinophils Absolute: 0.4 10*3/uL (ref 0–0.7)
LYMPHS ABS: 2.6 10*3/uL (ref 1.0–3.6)
LYMPHS PCT: 26 %
MONO ABS: 0.6 10*3/uL (ref 0.2–1.0)
MONOS PCT: 7 %
Neutro Abs: 6.2 10*3/uL (ref 1.4–6.5)
Neutrophils Relative %: 62 %

## 2018-03-07 LAB — GLUCOSE, CAPILLARY: Glucose-Capillary: 116 mg/dL — ABNORMAL HIGH (ref 65–99)

## 2018-03-07 LAB — APTT: aPTT: 30 seconds (ref 24–36)

## 2018-03-07 LAB — PROTIME-INR
INR: 1.02
Prothrombin Time: 13.3 seconds (ref 11.4–15.2)

## 2018-03-07 LAB — TROPONIN I: Troponin I: <0.03 ng/mL (ref <0.03)

## 2018-03-07 NOTE — ED Notes (Signed)
Pt denies dizziness at this time and shows no deficits w/ NIH Stroke screening.

## 2018-03-07 NOTE — ED Triage Notes (Signed)
Pt comes into the ED via POV c/o dizziness that had a sudden onset as [place] [employer].  Patient state he got very dizzy and had to grab onto the wrack to stand up and he called his family.  Patient is still dizzy at this time.  Patient denies that the room is spinning but he feels off balance.  Patient denies any weakness or slurred speech.  Time of dizziness was at 11:00 and he felt completely normal at 10:45.  Patient in NAD at this time with even and unlabored respirations.

## 2018-03-07 NOTE — ED Notes (Signed)
Dr. Sharma Covert brought to triage room to determine if code stroke needs to be called.  MD evaluating at this time.

## 2018-03-07 NOTE — ED Notes (Signed)
Pt verbalized understanding of discharge instructions. NAD at this time. 

## 2018-03-07 NOTE — ED Provider Notes (Signed)
[facility] Emergency Department Provider Note  ____________________________________________  Time seen: Approximately 1:30 PM  I have reviewed the triage vital signs and the nursing notes.   HISTORY  Chief Complaint Dizziness   HPI Alexander Kline. is a 77 y.o. male with a history of prediabetes and hyperlipidemia who presents for evaluation of dizziness.  Patient reports that he was feeling well in his usual state of health this morning.  He went to [place] with his daughter.  While standing in the candy aisle he started feeling lightheaded and fell he was going to pass out.  He called his daughter for assistance. He denies feeling room spinning. As soon as he arrived in the ED his symptoms resolved. He denies any dizziness at this time.  Patient denies headache, palpitations, chest pain, shortness of breath both preceding or after this episode.  He has no symptoms at this time.  He denies dysuria or hematuria, frequency, nausea, vomiting, diarrhea, abdominal pain, fever, URI symptoms.  Patient had a recent work-up for a TIA in January per daughter with ECHO and carotid dopplers and MRI in 10/2017 which were all negative. No records available for review. Not on blood thinners. NO recent fall or trauma.    Past Medical History:  Diagnosis Date  . Hyperlipidemia   . Pre-diabetes     Patient Active Problem List   Diagnosis Date Noted  . Hip fracture, unspecified laterality, closed, initial encounter (HCC) 09/28/2017  . Hip fracture (HCC) 09/28/2017    Past Surgical History:  Procedure Laterality Date  . HIP ARTHROPLASTY Right 09/29/2017   Procedure: ARTHROPLASTY BIPOLAR HIP (HEMIARTHROPLASTY);  Surgeon: Deeann Saint, MD;  Location: [facility] ORS;  Service: Orthopedics;  Laterality: Right;    Prior to Admission medications   Medication Sig Start Date End Date Taking? Authorizing Provider  acetaminophen (TYLENOL) 325 MG tablet Take 2 tablets (650 mg total)  by mouth every 6 (six) hours as needed for mild pain (or Fever >/= 101). 10/02/17   Altamese Dilling, MD  ATORVASTATIN CALCIUM PO Take 1 tablet by mouth daily.    [provider]  ferrous sulfate 325 (65 FE) MG tablet Take 1 tablet (325 mg total) by mouth 2 (two) times daily with a meal. 10/02/17   Altamese Dilling, MD  metFORMIN (GLUCOPHAGE) 500 MG tablet Take 500 mg by mouth daily with breakfast.    [provider]  pantoprazole (PROTONIX) 40 MG tablet Take 1 tablet (40 mg total) by mouth daily. 10/02/17 10/02/18  Altamese Dilling, MD    Allergies Sulfa antibiotics  Family History  Problem Relation Age of Onset  . CAD Father   . CAD Brother     Social History Social History   Tobacco Use  . Smoking status: Never Smoker  . Smokeless tobacco: Never Used  Substance Use Topics  . Alcohol use: No    Alcohol/week: 0.0 oz  . Drug use: No    Review of Systems  Constitutional: Negative for fever. + Lightheadedness Eyes: Negative for visual changes. ENT: Negative for sore throat. Neck: No neck pain  Cardiovascular: Negative for chest pain. Respiratory: Negative for shortness of breath. Gastrointestinal: Negative for abdominal pain, vomiting or diarrhea. Genitourinary: Negative for dysuria. Musculoskeletal: Negative for back pain. Skin: Negative for rash. Neurological: Negative for headaches, weakness or numbness. Psych: No SI or HI  ____________________________________________   PHYSICAL EXAM:  VITAL SIGNS: ED Triage Vitals  Enc Vitals Group     BP 03/07/18 1148 139/81  Pulse Rate 03/07/18 1148 65     Resp 03/07/18 1148 17     Temp 03/07/18 1148 98.2 F (36.8 C)     Temp Source 03/07/18 1148 Oral     SpO2 03/07/18 1148 97 %     Weight 03/07/18 1147 179 lb (81.2 kg)     Height 03/07/18 1147  (1.803 m)     Head Circumference --      Peak Flow --      Pain Score 03/07/18 1147 0     Pain Loc --      Pain Edu? --      Excl.  in GC? --     Constitutional: Alert and oriented. Well appearing and in no apparent distress. HEENT:      Head: Normocephalic and atraumatic.         Eyes: Conjunctivae are normal. Sclera is non-icteric.       Mouth/Throat: Mucous membranes are moist.       Neck: Supple with no signs of meningismus.  No carotid bruits Cardiovascular: Regular rate and rhythm. No murmurs, gallops, or rubs. 2+ symmetrical distal pulses are present in all extremities. No JVD. Respiratory: Normal respiratory effort. Lungs are clear to auscultation bilaterally. No wheezes, crackles, or rhonchi.  Gastrointestinal: Soft, non tender, and non distended with positive bowel sounds. No rebound or guarding. Musculoskeletal: Nontender with normal range of motion in all extremities. No edema, cyanosis, or erythema of extremities. Neurologic: Normal speech and language. A & O x3, PERRL, EOMI, no nystagmus, CN II-XII intact, motor testing reveals good tone and bulk throughout. There is no evidence of pronator drift or dysmetria. Muscle strength is 5/5 throughout. Sensory examination is intact. Gait is normal. Skin: Skin is warm, dry and intact. No rash noted. Psychiatric: Mood and affect are normal. Speech and behavior are normal. ____________________________________________   LABS (all labs ordered are listed, but only abnormal results are displayed)  Labs Reviewed  CBC - Abnormal; Notable for the following components:      Result Value   HCT 38.5 (*)    RDW 17.2 (*)    All other components within normal limits  COMPREHENSIVE METABOLIC PANEL - Abnormal; Notable for the following components:   Glucose, Bld 128 (*)    BUN 25 (*)    Creatinine, Ser 1.31 (*)    GFR calc non Af Amer 50 (*)    GFR calc Af Amer 58 (*)    All other components within normal limits  GLUCOSE, CAPILLARY - Abnormal; Notable for the following components:   Glucose-Capillary 116 (*)    All other components within normal limits  PROTIME-INR    APTT  DIFFERENTIAL  TROPONIN I  CBG MONITORING, ED   ____________________________________________  EKG  I, Nita Sickle, the attending physician, personally viewed and interpreted this ECG.  Normal sinus rhythm with occasional PVCs, rate of 65, normal intervals, normal axis, T wave inversion in lead III, no ST elevations or depressions.  EKG is unchanged from prior from 2011  ____________________________________________  RADIOLOGY I have personally reviewed the images performed during this visit and I agree with the Radiologist's read.   Interpretation by Radiologist:  Ct Head Wo Contrast  Result Date: 03/07/2018 CLINICAL DATA:  77 year old male with a history of dizziness EXAM: CT HEAD WITHOUT CONTRAST TECHNIQUE: Contiguous axial images were obtained from the base of the skull through the vertex without intravenous contrast. COMPARISON:  None. FINDINGS: Brain: No acute intracranial hemorrhage. No midline shift or  mass effect. Mild volume loss. Unremarkable configuration the ventricles. Gray-white differentiation maintained. Patchy hypodensity in the periventricular white matter. Vascular: No significant calcifications. Skull: No acute bony abnormality. Sinuses/Orbits: No acute finding. Other: None. IMPRESSION: Negative for acute intracranial abnormality. 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 Electronically Signed   By: Gilmer Mor D.O.   On: 03/07/2018 12:39    ____________________________________________   PROCEDURES  Procedure(s) performed: None Procedures Critical Care performed:  None ____________________________________________   INITIAL IMPRESSION / ASSESSMENT AND PLAN / ED COURSE  77 y.o. male with a history of prediabetes and hyperlipidemia who presents for evaluation of a short lived episode of dizziness/ lightheadedness.  Patient feels back to normal.  He is completely neurologically intact with normal gait, remainder of his physical exam  shows no acute findings.  Patient ambulated in the emergency room with no difficulty and no further episodes of dizziness.  His EKG shows occasional PVCs but no ischemic changes.  Troponin is negative.  Head CT is negative.  Labs with no acute findings such as anemia, AKI, or electrolyte abnormalities. Patient with no CP or SOB therefore clinically do not suspect PE or ACS. Not orthostatic. Patient was dc home to the care of his daughter with recommendations for close follow up with PCP and return precautions.       As part of my medical decision making, I reviewed the following data within the electronic MEDICAL RECORD NUMBER History obtained from family, Nursing notes reviewed and incorporated, Labs reviewed , Old EKG reviewed, Old chart reviewed, Radiograph reviewed , Notes from prior ED visits and [place] Controlled Substance Database    Pertinent labs & imaging results that were available during my care of the patient were reviewed by me and considered in my medical decision making (see chart for details).    ____________________________________________   FINAL CLINICAL IMPRESSION(S) / ED DIAGNOSES  Final diagnoses:  Dizziness      NEW MEDICATIONS STARTED DURING THIS VISIT:  ED Discharge Orders    None       Note:  This document was prepared using Dragon voice recognition software and may include unintentional dictation errors.    Don Perking, [place], MD 03/07/18 [phone]

## 2018-05-15 DIAGNOSIS — S76919A Strain of unspecified muscles, fascia and tendons at thigh level, unspecified thigh, initial encounter: Secondary | ICD-10-CM | POA: Insufficient documentation

## 2018-05-30 ENCOUNTER — Emergency Department: Payer: Medicare Other

## 2018-05-30 ENCOUNTER — Other Ambulatory Visit: Payer: Self-pay

## 2018-05-30 ENCOUNTER — Inpatient Hospital Stay
Admission: EM | Admit: 2018-05-30 | Discharge: 2018-06-04 | DRG: 065 | Disposition: A | Discharge status: Home Health | Payer: Medicare Other | Attending: Internal Medicine | Admitting: Internal Medicine

## 2018-05-30 ENCOUNTER — Encounter: Payer: Self-pay | Admitting: Emergency Medicine

## 2018-05-30 DIAGNOSIS — R29810 Facial weakness: Secondary | ICD-10-CM | POA: Diagnosis present

## 2018-05-30 DIAGNOSIS — E876 Hypokalemia: Secondary | ICD-10-CM | POA: Diagnosis present

## 2018-05-30 DIAGNOSIS — Z9181 History of falling: Secondary | ICD-10-CM | POA: Diagnosis not present

## 2018-05-30 DIAGNOSIS — E860 Dehydration: Secondary | ICD-10-CM | POA: Diagnosis present

## 2018-05-30 DIAGNOSIS — Z8249 Family history of ischemic heart disease and other diseases of the circulatory system: Secondary | ICD-10-CM | POA: Diagnosis not present

## 2018-05-30 DIAGNOSIS — I480 Paroxysmal atrial fibrillation: Secondary | ICD-10-CM | POA: Diagnosis present

## 2018-05-30 DIAGNOSIS — Z96641 Presence of right artificial hip joint: Secondary | ICD-10-CM | POA: Diagnosis present

## 2018-05-30 DIAGNOSIS — E1151 Type 2 diabetes mellitus with diabetic peripheral angiopathy without gangrene: Secondary | ICD-10-CM | POA: Diagnosis present

## 2018-05-30 DIAGNOSIS — Z882 Allergy status to sulfonamides status: Secondary | ICD-10-CM | POA: Diagnosis not present

## 2018-05-30 DIAGNOSIS — Z791 Long term (current) use of non-steroidal anti-inflammatories (NSAID): Secondary | ICD-10-CM

## 2018-05-30 DIAGNOSIS — E785 Hyperlipidemia, unspecified: Secondary | ICD-10-CM | POA: Diagnosis present

## 2018-05-30 DIAGNOSIS — R29700 NIHSS score 0: Secondary | ICD-10-CM | POA: Diagnosis present

## 2018-05-30 DIAGNOSIS — Z7984 Long term (current) use of oral hypoglycemic drugs: Secondary | ICD-10-CM

## 2018-05-30 DIAGNOSIS — R4182 Altered mental status, unspecified: Secondary | ICD-10-CM | POA: Diagnosis present

## 2018-05-30 DIAGNOSIS — D6859 Other primary thrombophilia: Secondary | ICD-10-CM | POA: Diagnosis present

## 2018-05-30 DIAGNOSIS — I6340 Cerebral infarction due to embolism of unspecified cerebral artery: Principal | ICD-10-CM | POA: Diagnosis present

## 2018-05-30 DIAGNOSIS — G8194 Hemiplegia, unspecified affecting left nondominant side: Secondary | ICD-10-CM | POA: Diagnosis present

## 2018-05-30 DIAGNOSIS — I639 Cerebral infarction, unspecified: Secondary | ICD-10-CM

## 2018-05-30 DIAGNOSIS — Z79899 Other long term (current) drug therapy: Secondary | ICD-10-CM

## 2018-05-30 DIAGNOSIS — I10 Essential (primary) hypertension: Secondary | ICD-10-CM | POA: Diagnosis present

## 2018-05-30 DIAGNOSIS — R29701 NIHSS score 1: Secondary | ICD-10-CM | POA: Diagnosis not present

## 2018-05-30 DIAGNOSIS — I371 Nonrheumatic pulmonary valve insufficiency: Secondary | ICD-10-CM | POA: Diagnosis not present

## 2018-05-30 LAB — COMPREHENSIVE METABOLIC PANEL
ALBUMIN: 3.8 g/dL (ref 3.5–5.0)
ALK PHOS: 82 U/L (ref 38–126)
ALT: 18 U/L (ref 0–44)
AST: 24 U/L (ref 15–41)
Anion gap: 7 (ref 5–15)
BILIRUBIN TOTAL: 0.9 mg/dL (ref 0.3–1.2)
BUN: 19 mg/dL (ref 8–23)
CALCIUM: 9.2 mg/dL (ref 8.9–10.3)
CO2: 28 mmol/L (ref 22–32)
Chloride: 107 mmol/L (ref 98–111)
Creatinine, Ser: 1.13 mg/dL (ref 0.61–1.24)
GFR calc Af Amer: >60 mL/min (ref >60)
GFR calc non Af Amer: >60 mL/min (ref >60)
Glucose, Bld: 117 mg/dL — ABNORMAL HIGH (ref 70–99)
POTASSIUM: 3.9 mmol/L (ref 3.5–5.1)
SODIUM: 142 mmol/L (ref 135–145)
TOTAL PROTEIN: 6.4 g/dL — AB (ref 6.5–8.1)

## 2018-05-30 LAB — URINALYSIS, COMPLETE (UACMP) WITH MICROSCOPIC
BACTERIA UA: NONE SEEN
BILIRUBIN URINE: NEGATIVE
GLUCOSE, UA: NEGATIVE mg/dL
HGB URINE DIPSTICK: NEGATIVE
Ketones, ur: 5 mg/dL — AB
Leukocytes, UA: NEGATIVE
NITRITE: NEGATIVE
PROTEIN: NEGATIVE mg/dL
Specific Gravity, Urine: 1.018 (ref 1.005–1.030)
pH: 5 (ref 5.0–8.0)

## 2018-05-30 LAB — CBC WITH DIFFERENTIAL/PLATELET
BASOS ABS: 0 10*3/uL (ref 0–0.1)
Basophils Relative: 1 %
EOS PCT: 10 %
Eosinophils Absolute: 0.7 10*3/uL (ref 0–0.7)
HCT: 35.6 % — ABNORMAL LOW (ref 40.0–52.0)
Hemoglobin: 12.1 g/dL — ABNORMAL LOW (ref 13.0–18.0)
LYMPHS PCT: 29 %
Lymphs Abs: 2 10*3/uL (ref 1.0–3.6)
MCH: 31.7 pg (ref 26.0–34.0)
MCHC: 34.2 g/dL (ref 32.0–36.0)
MCV: 92.8 fL (ref 80.0–100.0)
MONO ABS: 0.5 10*3/uL (ref 0.2–1.0)
Monocytes Relative: 7 %
Neutro Abs: 3.9 10*3/uL (ref 1.4–6.5)
Neutrophils Relative %: 55 %
Platelets: 165 10*3/uL (ref 150–440)
RBC: 3.83 MIL/uL — ABNORMAL LOW (ref 4.40–5.90)
RDW: 15.7 % — ABNORMAL HIGH (ref 11.5–14.5)
WBC: 7.2 10*3/uL (ref 3.8–10.6)

## 2018-05-30 LAB — CREATININE, SERUM: CREATININE: 1.12 mg/dL (ref 0.61–1.24)

## 2018-05-30 LAB — TSH: TSH: 1.42 u[IU]/mL (ref 0.350–4.500)

## 2018-05-30 LAB — CBC
HEMATOCRIT: 36.1 % — AB (ref 40.0–52.0)
HEMOGLOBIN: 12.4 g/dL — AB (ref 13.0–18.0)
MCH: 31.7 pg (ref 26.0–34.0)
MCHC: 34.5 g/dL (ref 32.0–36.0)
MCV: 92.1 fL (ref 80.0–100.0)
Platelets: 161 10*3/uL (ref 150–440)
RBC: 3.91 MIL/uL — AB (ref 4.40–5.90)
RDW: 15.7 % — ABNORMAL HIGH (ref 11.5–14.5)
WBC: 7.6 10*3/uL (ref 3.8–10.6)

## 2018-05-30 LAB — TROPONIN I: Troponin I: <0.03 ng/mL (ref <0.03)

## 2018-05-30 LAB — GLUCOSE, CAPILLARY
GLUCOSE-CAPILLARY: 79 mg/dL (ref 70–99)
GLUCOSE-CAPILLARY: 135 mg/dL — AB (ref 70–99)

## 2018-05-30 MED ORDER — TOPIRAMATE 25 MG PO TABS
50.0000 mg | ORAL_TABLET | Freq: Two times a day (BID) | ORAL | Status: DC | PRN
  Administered 2018-05-30: 23:00:00 50 mg via ORAL
  Filled 2018-05-30 (×2): qty 2

## 2018-05-30 MED ORDER — AMLODIPINE BESYLATE 10 MG PO TABS
10.0000 mg | ORAL_TABLET | Freq: Every day | ORAL | Status: DC
  Administered 2018-05-30 – 2018-06-04 (×5): 10 mg via ORAL
  Filled 2018-05-30 (×4): qty 1

## 2018-05-30 MED ORDER — INSULIN ASPART 100 UNIT/ML ~~LOC~~ SOLN
0.0000 [IU] | Freq: Three times a day (TID) | SUBCUTANEOUS | Status: DC
  Administered 2018-05-31 (×2): 1 [IU] via SUBCUTANEOUS
  Administered 2018-06-03: 2 [IU] via SUBCUTANEOUS
  Administered 2018-06-03: 1 [IU] via SUBCUTANEOUS
  Filled 2018-05-30 (×3): qty 1

## 2018-05-30 MED ORDER — ACETAMINOPHEN 650 MG RE SUPP: 650.0000 mg | RECTAL | Status: DC | PRN

## 2018-05-30 MED ORDER — ACETAMINOPHEN 160 MG/5ML PO SOLN
650.0000 mg | ORAL | Status: DC | PRN
  Filled 2018-05-30: qty 20.3

## 2018-05-30 MED ORDER — STROKE: EARLY STAGES OF RECOVERY BOOK
Freq: Once | Status: AC
  Administered 2018-05-30: 18:00:00

## 2018-05-30 MED ORDER — CLOPIDOGREL BISULFATE 75 MG PO TABS
75.0000 mg | ORAL_TABLET | Freq: Every day | ORAL | Status: DC
  Administered 2018-05-30 – 2018-06-04 (×5): 75 mg via ORAL
  Filled 2018-05-30 (×5): qty 1

## 2018-05-30 MED ORDER — ATORVASTATIN CALCIUM 20 MG PO TABS
80.0000 mg | ORAL_TABLET | Freq: Every day | ORAL | Status: DC
  Administered 2018-05-30 – 2018-06-04 (×5): 80 mg via ORAL
  Filled 2018-05-30 (×5): qty 4

## 2018-05-30 MED ORDER — INSULIN ASPART 100 UNIT/ML ~~LOC~~ SOLN: 0.0000 [IU] | Freq: Every day | SUBCUTANEOUS | Status: DC

## 2018-05-30 MED ORDER — AMLODIPINE BESYLATE 5 MG PO TABS
ORAL_TABLET | ORAL | Status: AC
  Administered 2018-05-30: 10 mg via ORAL
  Filled 2018-05-30: qty 2

## 2018-05-30 MED ORDER — ACETAMINOPHEN 325 MG PO TABS
650.0000 mg | ORAL_TABLET | ORAL | Status: DC | PRN
  Administered 2018-05-30: 21:00:00 650 mg via ORAL
  Filled 2018-05-30: qty 2

## 2018-05-30 MED ORDER — PANTOPRAZOLE SODIUM 40 MG PO TBEC
40.0000 mg | DELAYED_RELEASE_TABLET | Freq: Every day | ORAL | Status: DC
  Administered 2018-05-30 – 2018-06-04 (×5): 40 mg via ORAL
  Filled 2018-05-30 (×5): qty 1

## 2018-05-30 MED ORDER — ENOXAPARIN SODIUM 40 MG/0.4ML ~~LOC~~ SOLN
40.0000 mg | SUBCUTANEOUS | Status: DC
  Administered 2018-05-30 – 2018-06-03 (×5): 40 mg via SUBCUTANEOUS
  Filled 2018-05-30 (×5): qty 0.4

## 2018-05-30 MED ORDER — ENALAPRIL MALEATE 2.5 MG PO TABS
2.5000 mg | ORAL_TABLET | Freq: Every day | ORAL | Status: DC
  Administered 2018-05-30 – 2018-06-04 (×5): 2.5 mg via ORAL
  Filled 2018-05-30 (×7): qty 1

## 2018-05-30 MED ORDER — HYDRALAZINE HCL 20 MG/ML IJ SOLN
10.0000 mg | INTRAMUSCULAR | Status: DC | PRN
Start: 2018-05-30 — End: 2018-06-04
  Administered 2018-05-30 – 2018-06-01 (×3): 10 mg via INTRAVENOUS
  Filled 2018-05-30 (×4): qty 1

## 2018-05-30 MED ORDER — SENNOSIDES-DOCUSATE SODIUM 8.6-50 MG PO TABS: 1.0000 | ORAL_TABLET | Freq: Every evening | ORAL | Status: DC | PRN

## 2018-05-30 NOTE — Plan of Care (Signed)
  Problem: Education: Goal: Knowledge of disease or condition will improve Outcome: Progressing Goal: Knowledge of secondary prevention will improve Outcome: Progressing Goal: Knowledge of patient specific risk factors addressed and post discharge goals established will improve Outcome: Progressing   Problem: Self-Care: Goal: Ability to participate in self-care as condition permits will improve Outcome: Progressing   Problem: Ischemic Stroke/TIA Tissue Perfusion: Goal: Complications of ischemic stroke/TIA will be minimized Outcome: Progressing   

## 2018-05-30 NOTE — ED Notes (Signed)
Pt transported to MRI at this time 

## 2018-05-30 NOTE — ED Triage Notes (Addendum)
PT to ED via POV with family friend, pt states increased confusion x1wk. Per friend pt is not at baseline and lives alone so family is concerned. PT is A&OX4, VSS , pt ambulatory. Denies any GU symptoms , states mild headache since yesterday

## 2018-05-30 NOTE — Progress Notes (Signed)
Patient admitted to room 112 from the ED with diagnosis of CVA. Patient alert oriented x 4, no c/o pain at this time, family at bedside. Patient is in bed in  Locked in lower position, call [place], telephone and bedside table at patient's reach. Will continue to monitor patient, [place], [place].

## 2018-05-30 NOTE — H&P (Signed)
[employer] [facility] - [place] [facility]   PATIENT NAME: Alexander Kline    MR#:  [id number]  DATE OF BIRTH:  05/08/1941  DATE OF ADMISSION:  05/30/2018  PRIMARY CARE PHYSICIAN: Sherron Mondayejan-Sie, S Ahmed, MD   REQUESTING/REFERRING PHYSICIAN:   CHIEF COMPLAINT:   Chief Complaint  Patient presents with  . Altered Mental Status    HISTORY OF PRESENT ILLNESS: Alexander Kline  is a [age] y.o. male with a known history per below presented to the emergency room with 1 week history of intermittent worsening confusion, earlier today the patient was supposed to meet his daughter somewhere and was found at local [employer] store instead, patient also with mild headache, in the emergency room patient was found to have acute/subacute medial left cerebellar punctate infarct, urinalysis consistent with dehydration/noted ketones, blood pressure uncontrolled with systolic blood pressure 1 70-1 80 systolic, patient evaluated emergency room, wife at the bedside, patient in no apparent distress, resting comfortably, patient now be admitted for acute cerebrovascular accident with acute confusion and accelerated hypertension with headache.  PAST MEDICAL HISTORY:   Past Medical History:  Diagnosis Date  . Hyperlipidemia   . Pre-diabetes     PAST SURGICAL HISTORY:  Past Surgical History:  Procedure Laterality Date  . HIP ARTHROPLASTY Right 09/29/2017   Procedure: ARTHROPLASTY BIPOLAR HIP (HEMIARTHROPLASTY);  Surgeon: Deeann SaintMiller, Howard, MD;  Location: [facility] ORS;  Service: Orthopedics;  Laterality: Right;    SOCIAL HISTORY:  Social History   Tobacco Use  . Smoking status: Never Smoker  . Smokeless tobacco: Never Used  Substance Use Topics  . Alcohol use: No    Alcohol/week: 0.0 oz    FAMILY HISTORY:  Family History  Problem Relation Age of Onset  . CAD Father   . CAD Brother     DRUG ALLERGIES:  Allergies  Allergen Reactions  . Sulfa Antibiotics     Per pt, he gets bad headaches with sulfa drugs     REVIEW OF SYSTEMS:   CONSTITUTIONAL: No fever, fatigue or weakness.  EYES: No blurred or double vision.  EARS, NOSE, AND THROAT: No tinnitus or ear pain.  RESPIRATORY: No cough, shortness of breath, wheezing or hemoptysis.  CARDIOVASCULAR: No chest pain, orthopnea, edema.  GASTROINTESTINAL: No nausea, vomiting, diarrhea or abdominal pain.  GENITOURINARY: No dysuria, hematuria.  ENDOCRINE: No polyuria, nocturia,  HEMATOLOGY: No anemia, easy bruising or bleeding SKIN: No rash or lesion. MUSCULOSKELETAL: No joint pain or arthritis.   NEUROLOGIC: Confusion, wandering, headache PSYCHIATRY: No anxiety or depression.   MEDICATIONS AT HOME:  Prior to Admission medications   Medication Sig Start Date End Date Taking? Authorizing Provider  atorvastatin (LIPITOR) 40 MG tablet Take 40 mg by mouth daily. 04/01/18  Yes [provider]  enalapril (VASOTEC) 2.5 MG tablet Take 2.5 mg by mouth daily. 04/01/18  Yes [provider]  meloxicam (MOBIC) 15 MG tablet Take 15 mg by mouth daily. 05/15/18  Yes [provider]  metFORMIN (GLUCOPHAGE-XR) 500 MG 24 hr tablet Take 500 mg by mouth daily. 04/08/18  Yes [provider]  methocarbamol (ROBAXIN) 500 MG tablet Take 500 mg by mouth 2 (two) times daily. 05/15/18  Yes [provider]  acetaminophen (TYLENOL) 325 MG tablet Take 2 tablets (650 mg total) by mouth every 6 (six) hours as needed for mild pain (or Fever >/= 101). 10/02/17   Altamese DillingVachhani, Vaibhavkumar, MD  ferrous sulfate 325 (65 FE) MG tablet Take 1 tablet (325 mg total) by mouth 2 (two) times  daily with a meal. 10/02/17   Altamese Dilling, MD  pantoprazole (PROTONIX) 40 MG tablet Take 1 tablet (40 mg total) by mouth daily. 10/02/17 10/02/18  Altamese Dilling, MD      PHYSICAL EXAMINATION:   VITAL SIGNS: Blood pressure (!) 178/88, pulse (!) 53, temperature 97.7 F (36.5 C), temperature source Oral, resp. rate 16, height 5\' 11"  (1.803 m), weight 84.4  kg (186 lb), SpO2 98 %.  GENERAL:  77 y.o.-year-old patient lying in the bed with no acute distress.  EYES: Pupils equal, round, reactive to light and accommodation. No scleral icterus. Extraocular muscles intact.  HEENT: Head atraumatic, normocephalic. Oropharynx and nasopharynx clear.  NECK:  Supple, no jugular venous distention. No thyroid enlargement, no tenderness.  LUNGS: Normal breath sounds bilaterally, no wheezing, rales,rhonchi or crepitation. No use of accessory muscles of respiration.  CARDIOVASCULAR: S1, S2 normal. No murmurs, rubs, or gallops.  ABDOMEN: Soft, nontender, nondistended. Bowel sounds present. No organomegaly or mass.  EXTREMITIES: No pedal edema, cyanosis, or clubbing.  NEUROLOGIC: Cranial nerves II through XII are intact. Muscle strength 5/5 in all extremities. Sensation intact. Gait not checked.  PSYCHIATRIC: The patient is alert and oriented x 3.  SKIN: No obvious rash, lesion, or ulcer.   LABORATORY PANEL:   CBC Recent Labs  Lab 05/30/18 1052  WBC 7.2  HGB 12.1*  HCT 35.6*  PLT 165  MCV 92.8  MCH 31.7  MCHC 34.2  RDW 15.7*  LYMPHSABS 2.0  MONOABS 0.5  EOSABS 0.7  BASOSABS 0.0   ------------------------------------------------------------------------------------------------------------------  Chemistries  Recent Labs  Lab 05/30/18 1052  NA 142  K 3.9  CL 107  CO2 28  GLUCOSE 117*  BUN 19  CREATININE 1.13  CALCIUM 9.2  AST 24  ALT 18  ALKPHOS 82  BILITOT 0.9   ------------------------------------------------------------------------------------------------------------------ estimated creatinine clearance is 57.4 mL/min (by C-G formula based on SCr of 1.13 mg/dL). ------------------------------------------------------------------------------------------------------------------ Recent Labs    05/30/18 1052  TSH 1.420     Coagulation profile No results for input(s): INR, PROTIME in the last 168  hours. ------------------------------------------------------------------------------------------------------------------- No results for input(s): DDIMER in the last 72 hours. -------------------------------------------------------------------------------------------------------------------  Cardiac Enzymes Recent Labs  Lab 05/30/18 1052  TROPONINI <0.03   ------------------------------------------------------------------------------------------------------------------ Invalid input(s): POCBNP  ---------------------------------------------------------------------------------------------------------------  Urinalysis    Component Value Date/Time   COLORURINE YELLOW (A) 05/30/2018 1052   APPEARANCEUR CLEAR (A) 05/30/2018 1052   LABSPEC 1.018 05/30/2018 1052   PHURINE 5.0 05/30/2018 1052   GLUCOSEU NEGATIVE 05/30/2018 1052   HGBUR NEGATIVE 05/30/2018 1052   BILIRUBINUR NEGATIVE 05/30/2018 1052   KETONESUR 5 (A) 05/30/2018 1052   PROTEINUR NEGATIVE 05/30/2018 1052   NITRITE NEGATIVE 05/30/2018 1052   LEUKOCYTESUR NEGATIVE 05/30/2018 1052     RADIOLOGY: Dg Chest 2 View  Result Date: 05/30/2018 CLINICAL DATA:  Confusion EXAM: CHEST - 2 VIEW COMPARISON:  Chest radiograph 09/29/2017 FINDINGS: Stable cardiac and mediastinal contours. No consolidative pulmonary opacities. No pleural effusion or pneumothorax. Thoracic spine degenerative changes. Surgical hardware left shoulder. IMPRESSION: No acute cardiopulmonary process. Electronically Signed   By: Annia Belt M.D.   On: 05/30/2018 10:35   Ct Head Wo Contrast  Result Date: 05/30/2018 CLINICAL DATA:  Altered level of consciousness. EXAM: CT HEAD WITHOUT CONTRAST TECHNIQUE: Contiguous axial images were obtained from the base of the skull through the vertex without intravenous contrast. COMPARISON:  CT scan of Mar 07, 2018. FINDINGS: Brain: Minimal diffuse cortical atrophy is noted. Mild chronic ischemic white matter disease is noted. No  mass effect or midline shift is noted. Ventricular size is within normal limits. There is no evidence of mass lesion, hemorrhage or acute infarction. Vascular: No hyperdense vessel or unexpected calcification. Skull: Normal. Negative for fracture or focal lesion. Sinuses/Orbits: No acute finding. Other: None. IMPRESSION: Minimal diffuse cortical atrophy. Mild chronic ischemic white matter disease. No acute intracranial abnormality seen. Electronically Signed   By: Lupita Raider, M.D.   On: 05/30/2018 10:50   Mr Brain Wo Contrast  Result Date: 05/30/2018 CLINICAL DATA:  77 y/o M; 1 week of increased confusion and mild headache yesterday. EXAM: MRI HEAD WITHOUT CONTRAST TECHNIQUE: Multiplanar, multiecho pulse sequences of the brain and surrounding structures were obtained without intravenous contrast. COMPARISON:  05/30/2018 CT head. FINDINGS: Brain: Punctate focus of diffusion hyperintensity within the left medial cerebellum without definite reduced ADC, possible acute or subacute microvascular infarction. No associated hemorrhage or mass effect. No abnormal susceptibility hypointensity to indicate intracranial hemorrhage. Few nonspecific foci of T2 FLAIR hyperintense signal abnormality predominantly within periventricular white matter is compatible with mild chronic microvascular ischemic changes. Moderate volume loss of the brain for age. No focal mass effect, extra-axial collection, hydrocephalus, or herniation. Vascular: Normal flow voids. Skull and upper cervical spine: Normal marrow signal. Sinuses/Orbits: Negative.  Bilateral intra-ocular lens replacement. Other: None. IMPRESSION: 1. Possible punctate acute or subacute microvascular infarction within the left medial cerebellum. 2. Mild chronic microvascular ischemic changes and moderate volume loss of the brain for age. Electronically Signed   By: Mitzi Hansen M.D.   On: 05/30/2018 14:15    EKG: Orders placed or performed during the  hospital encounter of 05/30/18  . ED EKG  . ED EKG  . EKG 12-Lead  . EKG 12-Lead    IMPRESSION AND PLAN: *Acute/subacute left medial cerebellar infarct Compounded by ? Hole in Heart per patient/family Admit to observation unit on our CVA protocol, discontinue aspirin-start Plavix daily, statin therapy-check lipids in the morning, check MRA of the head neck, echocardiogram, carotid Dopplers, neurology to see, neurochecks per routine, physical therapy/speech therapy to evaluate/treat, fall precautions, continue close medical monitoring  *Chronic heart abnormality Per family-has hole in his heart  Follow-up on echocardiogram  *Acute accelerated hypertension Continue enalapril, start Norvasc, hydralazine as needed systolic blood pressure greater than 160, and make changes as per necessary  *Acute headache Most likely secondary to above Topamax/Tylenol as needed  *Chronic hyperlipidemia, unspecified Statin therapy and check lipids in the morning  *Chronic prediabetes Placed on sliding scale insulin for now, Accu-Cheks per routine  *Acute dehydration IV fluids rehydration    All the records are reviewed and case discussed with ED provider. Management plans discussed with the patient, family and they are in agreement.  CODE STATUS:full Code Status History    Date Active Date Inactive Code Status Order ID Comments User Context   09/28/2017 1656 10/02/2017 1650 Full Code [id number]  Altamese Dilling, MD Inpatient       TOTAL TIME TAKING CARE OF THIS PATIENT: 45 minutes.    Evelena Asa Alanson Hausmann M.D on 05/30/2018   Between 7am to 6pm - Pager - [phone]  After 6pm go to [email or web address] - password [email or web address]  [facility]  Office  [phone]  CC: Primary care physician; Sherron Monday, MD   Note: This dictation was prepared with Dragon dictation along with smaller phrase technology. Any transcriptional errors that result from this process are  unintentional.

## 2018-05-30 NOTE — ED Notes (Signed)
Patient transported to CT 

## 2018-05-30 NOTE — ED Provider Notes (Signed)
[facility] Emergency Department Provider Note ____________________________________________   First MD Initiated Contact with Patient 05/30/18 1000     (approximate)  I have reviewed the triage vital signs and the nursing notes.   HISTORY  Chief Complaint Altered Mental Status  HPI Alexander MeadowRaymond E Cowans Jr. is a 77 y.o. male with a history of hyperlipidemia, hip fracture diabetes was presented to the emergency department with 1 week of worsening confusion.  He is here with a friend who walks with him, daily, who states that he is actually been slightly more confused over the past 7 months ever since his hip fracture this past December.  The patient lives by himself and drives.  However, this morning, he was supposed to report to his daughter store but was instead found at a [employer].  The patient denies any pain.  Denies any burning with urination.  No reports of recent falls.   Past Medical History:  Diagnosis Date  . Hyperlipidemia   . Pre-diabetes     Patient Active Problem List   Diagnosis Date Noted  . Hip fracture, unspecified laterality, closed, initial encounter (HCC) 09/28/2017  . Hip fracture (HCC) 09/28/2017    Past Surgical History:  Procedure Laterality Date  . HIP ARTHROPLASTY Right 09/29/2017   Procedure: ARTHROPLASTY BIPOLAR HIP (HEMIARTHROPLASTY);  Surgeon: Deeann SaintMiller, Howard, MD;  Location: [facility] ORS;  Service: Orthopedics;  Laterality: Right;    Prior to Admission medications   Medication Sig Start Date End Date Taking? Authorizing Provider  atorvastatin (LIPITOR) 40 MG tablet Take 40 mg by mouth daily. 04/01/18  Yes [provider]  enalapril (VASOTEC) 2.5 MG tablet Take 2.5 mg by mouth daily. 04/01/18  Yes [provider]  meloxicam (MOBIC) 15 MG tablet Take 15 mg by mouth daily. 05/15/18  Yes [provider]  metFORMIN (GLUCOPHAGE-XR) 500 MG 24 hr tablet Take 500 mg by mouth daily. 04/08/18  Yes [provider]   methocarbamol (ROBAXIN) 500 MG tablet Take 500 mg by mouth 2 (two) times daily. 05/15/18  Yes [provider]  acetaminophen (TYLENOL) 325 MG tablet Take 2 tablets (650 mg total) by mouth every 6 (six) hours as needed for mild pain (or Fever >/= 101). 10/02/17   Altamese DillingVachhani, Vaibhavkumar, MD  ferrous sulfate 325 (65 FE) MG tablet Take 1 tablet (325 mg total) by mouth 2 (two) times daily with a meal. 10/02/17   Altamese DillingVachhani, Vaibhavkumar, MD  pantoprazole (PROTONIX) 40 MG tablet Take 1 tablet (40 mg total) by mouth daily. 10/02/17 10/02/18  Altamese DillingVachhani, Vaibhavkumar, MD    Allergies Sulfa antibiotics  Family History  Problem Relation Age of Onset  . CAD Father   . CAD Brother     Social History Social History   Tobacco Use  . Smoking status: Never Smoker  . Smokeless tobacco: Never Used  Substance Use Topics  . Alcohol use: No    Alcohol/week: 0.0 oz  . Drug use: No    Review of Systems  Constitutional: No fever/chills Eyes: No visual changes. ENT: No sore throat. Cardiovascular: Denies chest pain. Respiratory: Denies shortness of breath. Gastrointestinal: No abdominal pain.  No nausea, no vomiting.  No diarrhea.  No constipation. Genitourinary: Negative for dysuria. Musculoskeletal: Negative for back pain. Skin: Negative for rash. Neurological: Negative for headaches, focal weakness or numbness.   ____________________________________________   PHYSICAL EXAM:  VITAL SIGNS: ED Triage Vitals  Enc Vitals Group     BP 05/30/18 0957 (!) 180/81  Pulse Rate 05/30/18 0955 100     Resp 05/30/18 0955 16     Temp 05/30/18 0955 97.7 F (36.5 C)     Temp Source 05/30/18 0955 Oral     SpO2 05/30/18 0955 96 %     Weight 05/30/18 0956 186 lb (84.4 kg)     Height 05/30/18 0956 5\' 11"  (1.803 m)     Head Circumference --      Peak Flow --      Pain Score 05/30/18 0956 3     Pain Loc --      Pain Edu? --      Excl. in GC? --     Constitutional: Alert and oriented. Well  appearing and in no acute distress.  Patient starts each answer that I asked him with "well this is the greatest thing."  He then gives long and wandering answers. Eyes: Conjunctivae are normal.  Head: Atraumatic. Nose: No congestion/rhinnorhea. Mouth/Throat: Mucous membranes are moist.  Neck: No stridor.   Cardiovascular: Normal rate, regular rhythm. Grossly normal heart sounds.  Respiratory: Normal respiratory effort.  No retractions. Lungs CTAB. Gastrointestinal: Soft and nontender. No distention. No CVA tenderness. Musculoskeletal: No lower extremity tenderness nor edema.  No joint effusions. Neurologic:  Normal speech and language. No gross focal neurologic deficits are appreciated. Skin:  Skin is warm, dry and intact. No rash noted. Psychiatric: Mood and affect are normal. Speech and behavior are normal.  ____________________________________________   LABS (all labs ordered are listed, but only abnormal results are displayed)  Labs Reviewed  CBC WITH DIFFERENTIAL/PLATELET - Abnormal; Notable for the following components:      Result Value   RBC 3.83 (*)    Hemoglobin 12.1 (*)    HCT 35.6 (*)    RDW 15.7 (*)    All other components within normal limits  COMPREHENSIVE METABOLIC PANEL - Abnormal; Notable for the following components:   Glucose, Bld 117 (*)    Total Protein 6.4 (*)    All other components within normal limits  URINALYSIS, COMPLETE (UACMP) WITH MICROSCOPIC - Abnormal; Notable for the following components:   Color, Urine YELLOW (*)    APPearance CLEAR (*)    Ketones, ur 5 (*)    All other components within normal limits  TROPONIN I  TSH   ____________________________________________  EKG  ED ECG REPORT I, Arelia Longest, the attending physician, personally viewed and interpreted this ECG.   Date: 05/30/2018  EKG Time: 1050  Rate: 54  Rhythm: sinus bradycardia  Axis: Normal  Intervals:none  ST&T Change: No ST segment elevation or depression.  No  abnormal T wave inversion.  ____________________________________________  RADIOLOGY  Possible punctate acute or subacute microvascular infarction on the MRI.  CT head with minimal diffuse cortical atrophy.  Chest x-ray without acute process. ____________________________________________   PROCEDURES  Procedure(s) performed:   Procedures  Critical Care performed:   ____________________________________________   INITIAL IMPRESSION / ASSESSMENT AND PLAN / ED COURSE  Pertinent labs & imaging results that were available during my care of the patient were reviewed by me and considered in my medical decision making (see chart for details).  Differential diagnosis includes, but is not limited to, alcohol, illicit or prescription medications, or other toxic ingestion; intracranial pathology such as stroke or intracerebral hemorrhage; fever or infectious causes including sepsis; hypoxemia and/or hypercarbia; uremia; trauma; endocrine related disorders such as diabetes, hypoglycemia, and thyroid-related diseases; hypertensive encephalopathy; etc. As part of my medical decision making, I reviewed  the following data within the electronic MEDICAL RECORD NUMBER Notes from prior ED visits  ----------------------------------------- 3:11 PM on 05/30/2018 -----------------------------------------  Patient at this time with evidence of stroke.  Unclear if he has taken aspirin today because he reported that he was going to [employer] earlier today reportedly to get aspirin.  I will not give aspirin at this time.  Also with elevated blood pressure and patient does not have a history of this.  Patient to be admitted to the medical service.  Signed out to Dr. Katheren Shams. ____________________________________________   FINAL CLINICAL IMPRESSION(S) / ED DIAGNOSES  CVA.  NEW MEDICATIONS STARTED DURING THIS VISIT:  New Prescriptions   No medications on file     Note:  This document was prepared using  Dragon voice recognition software and may include unintentional dictation errors.     Myrna Blazer, MD 05/30/18 [phone]

## 2018-05-30 NOTE — Progress Notes (Signed)
Family Meeting Note  Advance Directive:yes  Today a meeting took place with the Patient.  Patient is able to participate   The following clinical team members were present during this meeting:MD  The following were discussed:Patient's diagnosis:cva , Patient's progosis: Unable to determine and Goals for treatment: Full Code  Additional follow-up to be provided: prn  Time spent during discussion:20 minutes  Alexander Coryell D [name], MD  

## 2018-05-31 ENCOUNTER — Inpatient Hospital Stay: Payer: Medicare Other

## 2018-05-31 ENCOUNTER — Other Ambulatory Visit: Payer: Self-pay

## 2018-05-31 DIAGNOSIS — I639 Cerebral infarction, unspecified: Secondary | ICD-10-CM

## 2018-05-31 DIAGNOSIS — R4182 Altered mental status, unspecified: Secondary | ICD-10-CM

## 2018-05-31 LAB — BASIC METABOLIC PANEL
ANION GAP: 5 (ref 5–15)
BUN: 18 mg/dL (ref 8–23)
CALCIUM: 9 mg/dL (ref 8.9–10.3)
CHLORIDE: 109 mmol/L (ref 98–111)
CO2: 26 mmol/L (ref 22–32)
Creatinine, Ser: 0.86 mg/dL (ref 0.61–1.24)
GFR calc Af Amer: >60 mL/min (ref >60)
GFR calc non Af Amer: >60 mL/min (ref >60)
GLUCOSE: 134 mg/dL — AB (ref 70–99)
Potassium: 4 mmol/L (ref 3.5–5.1)
Sodium: 140 mmol/L (ref 135–145)

## 2018-05-31 LAB — GLUCOSE, CAPILLARY
GLUCOSE-CAPILLARY: 134 mg/dL — AB (ref 70–99)
GLUCOSE-CAPILLARY: 124 mg/dL — AB (ref 70–99)
GLUCOSE-CAPILLARY: 104 mg/dL — AB (ref 70–99)
Glucose-Capillary: 132 mg/dL — ABNORMAL HIGH (ref 70–99)
Glucose-Capillary: 128 mg/dL — ABNORMAL HIGH (ref 70–99)

## 2018-05-31 LAB — LIPID PANEL
CHOL/HDL RATIO: 3.8 ratio
CHOLESTEROL: 150 mg/dL (ref 0–200)
HDL: 39 mg/dL — AB (ref >40)
LDL Cholesterol: 91 mg/dL (ref 0–99)
TRIGLYCERIDES: 100 mg/dL (ref <150)
VLDL: 20 mg/dL (ref 0–40)

## 2018-05-31 LAB — HEMOGLOBIN A1C
Hgb A1c MFr Bld: 5.3 % (ref 4.8–5.6)
Mean Plasma Glucose: 105.41 mg/dL

## 2018-05-31 MED ORDER — DEXMEDETOMIDINE HCL IN NACL 200 MCG/50ML IV SOLN
0.4000 ug/kg/h | INTRAVENOUS | Status: DC
  Filled 2018-05-31: qty 50

## 2018-05-31 MED ORDER — SODIUM CHLORIDE 0.9 % IV SOLN
INTRAVENOUS | Status: DC
  Administered 2018-05-31 – 2018-06-03 (×7): via INTRAVENOUS

## 2018-05-31 MED ORDER — LORAZEPAM 2 MG/ML IJ SOLN
INTRAMUSCULAR | Status: AC
  Filled 2018-05-31: qty 1

## 2018-05-31 MED ORDER — LORAZEPAM 2 MG/ML IJ SOLN
1.0000 mg | Freq: Once | INTRAMUSCULAR | Status: AC
  Administered 2018-05-31: 1 mg via INTRAVENOUS
  Filled 2018-05-31: qty 1

## 2018-05-31 MED ORDER — HALOPERIDOL LACTATE 5 MG/ML IJ SOLN
1.0000 mg | Freq: Four times a day (QID) | INTRAMUSCULAR | Status: DC | PRN
  Administered 2018-05-31 – 2018-06-01 (×3): 2 mg via INTRAVENOUS
  Filled 2018-05-31 (×3): qty 1

## 2018-05-31 MED ORDER — DEXMEDETOMIDINE HCL IN NACL 200 MCG/50ML IV SOLN: 0.4000 ug/kg/h | INTRAVENOUS | Status: DC

## 2018-05-31 MED ORDER — DEXMEDETOMIDINE HCL IN NACL 400 MCG/100ML IV SOLN
0.4000 ug/kg/h | INTRAVENOUS | Status: DC
  Administered 2018-05-31: 04:00:00 1.2 ug/kg/h via INTRAVENOUS
  Administered 2018-05-31: 0.5 ug/kg/h via INTRAVENOUS
  Filled 2018-05-31 (×2): qty 100

## 2018-05-31 NOTE — Progress Notes (Signed)
Told in handoff report pt. Fluctuating btwn sedated and combative.  Alerted Ms. Blakeney, NP to pt.'s RASS: -4 even after discontinuing precedex. Pt. SB, but other VSS, on RA and protecting airway, mute and not opening eyes, Pupils equal and brisk.  Since pt. Has had multiple periods intermittently during day shift of lethargic behavior, will continue to monitor pt. Closely.

## 2018-05-31 NOTE — Progress Notes (Signed)
At approximately 320 am went into room to assess pnt after daughter reported that pnt just stopped answering her questions, pnt had just recently come back from an MRA.   Upon entering room pnt was alert but nonverbal. Unable to follow command of requesting pnt to smile. Pnt was delayed in raising arms in the arm. Vitals taken.  Code stroke called, hospitalist Prasanna Sridharan notified. AC, ICU charge, and Lab arrived to unit to assess pnt. Chaplain at bedside with daughter.  Pnt taken by ICU nurse to CT. Pnt then transferred to ICU following CT.   [facility] in [facility].

## 2018-05-31 NOTE — Progress Notes (Signed)
PT Cancellation Note  Patient Details Name: Alexander MeadowRaymond E Lyvers Jr. MRN: [id number] DOB: 01/28/1941   Cancelled Treatment:    Reason Eval/Treat Not Completed: Medical issues which prohibited therapy(Per chart review, patient transferred to CCU due to change in status.  Will require new orders to resume PT services.  Please re-consult as medically appropriate.)   [name] H. Manson PasseyBrown, PT, DPT, NCS 05/31/18, 11:56 PM [phone]

## 2018-05-31 NOTE — Progress Notes (Signed)
PT Cancellation Note  Patient Details Name: Alexander MeadowRaymond E Bissonette Jr. MRN: [id number] DOB: 01/22/1941   Cancelled Treatment:     PT order received and pt chart reviewed. Treatment attempted but deferred secondary to discussion with nursing indicating pt is heavily sedated and for transfer to procedure later today. Will reattempt treatment at next medically appropriate date.    Grayland Jackolby Elayne Gruver, SPT 05/31/18,12:17 PM

## 2018-05-31 NOTE — Progress Notes (Signed)
eeg completed ° °

## 2018-05-31 NOTE — Progress Notes (Signed)
Supported Armed forces technical officeray, daughter [name](Alexander Kline), and staff during Code Stroke and procedures. Ray was agitated. Daughter was supportive and aware of her father's decline since December 1. Both are exhausted. Both benefited from words of assurance, education, and calm presence.

## 2018-05-31 NOTE — Progress Notes (Signed)
OT Cancellation Note  Patient Details Name: Alexander MeadowRaymond E Witts Jr. MRN: [id number] DOB: 08/30/1941   Cancelled Treatment:    Reason Eval/Treat Not Completed: Fatigue/lethargy limiting ability to participate;Patient at procedure or test/ unavailable. Pt. Is heavily sedated for a procedure this afternoon. Will attempt the OT initial eval on the next treatment date as medically appropriate.  Olegario MessierElaine Anothony Bursch, MS, OTR/L 05/31/2018, 1:58 PM

## 2018-05-31 NOTE — Progress Notes (Signed)
[facility] - [place] [facility]   PATIENT NAME: Alexander Kline    MR#:  [id number]  DATE OF BIRTH:  05/24/1941  SUBJECTIVE:  CHIEF COMPLAINT:   Chief Complaint  Patient presents with  . Altered Mental Status   - admitted with falls and weakness- MRI with stroke- moved to ICU early this AM for worsening delirium - on precedex drip- daughter at bedside  REVIEW OF SYSTEMS:  Review of Systems  Unable to perform ROS: Mental status change    DRUG ALLERGIES:   Allergies  Allergen Reactions  . Sulfa Antibiotics     Per pt, he gets bad headaches with sulfa drugs    VITALS:  Blood pressure 129/79, pulse 64, temperature 97.8 F (36.6 C), temperature source Oral, resp. rate 10, height 5\' 11"  (1.803 m), weight 84.4 kg (186 lb), SpO2 96 %.  PHYSICAL EXAMINATION:  Physical Exam  GENERAL:  77 y.o.-year-old patient lying in the bed, sedated EYES: Pupils equal, round, reactive to light and accommodation. No scleral icterus.   HEENT: Head atraumatic, normocephalic. Oropharynx and nasopharynx clear.  NECK:  Supple, no jugular venous distention. No thyroid enlargement, no tenderness.  LUNGS: Normal breath sounds bilaterally, no wheezing, rales,rhonchi or crepitation. No use of accessory muscles of respiration. Decreased bibasilar breath sounds CARDIOVASCULAR: S1, S2 normal. No  rubs, or gallops. 2/6 systolic murmur present ABDOMEN: Soft, nontender, nondistended. Bowel sounds present. No organomegaly or mass.  EXTREMITIES: No pedal edema, cyanosis, or clubbing.  NEUROLOGIC: Cranial nerves II through XII are intact. No facial droop. Moving in bed- restless and moving all extremities - unable to do a complete neuro exam- due to being sedated  -Gait not checked.  PSYCHIATRIC: The patient is sedated.  SKIN: No obvious rash, lesion, or ulcer.    LABORATORY PANEL:   CBC Recent Labs  Lab 05/30/18 1727  WBC 7.6  HGB 12.4*  HCT 36.1*  PLT 161    ------------------------------------------------------------------------------------------------------------------  Chemistries  Recent Labs  Lab 05/30/18 1052  05/31/18 1052  NA 142  --  140  K 3.9  --  4.0  CL 107  --  109  CO2 28  --  26  GLUCOSE 117*  --  134*  BUN 19  --  18  CREATININE 1.13   < > 0.86  CALCIUM 9.2  --  9.0  AST 24  --   --   ALT 18  --   --   ALKPHOS 82  --   --   BILITOT 0.9  --   --    < > = values in this interval not displayed.   ------------------------------------------------------------------------------------------------------------------  Cardiac Enzymes Recent Labs  Lab 05/30/18 1052  TROPONINI <0.03   ------------------------------------------------------------------------------------------------------------------  RADIOLOGY:  Dg Chest 2 View  Result Date: 05/30/2018 CLINICAL DATA:  Confusion EXAM: CHEST - 2 VIEW COMPARISON:  Chest radiograph 09/29/2017 FINDINGS: Stable cardiac and mediastinal contours. No consolidative pulmonary opacities. No pleural effusion or pneumothorax. Thoracic spine degenerative changes. Surgical hardware left shoulder. IMPRESSION: No acute cardiopulmonary process. Electronically Signed   By: Annia Beltrew  Davis M.D.   On: 05/30/2018 10:35   Ct Head Wo Contrast  Result Date: 05/30/2018 CLINICAL DATA:  Altered level of consciousness. EXAM: CT HEAD WITHOUT CONTRAST TECHNIQUE: Contiguous axial images were obtained from the base of the skull through the vertex without intravenous contrast. COMPARISON:  CT scan of Mar 07, 2018. FINDINGS: Brain: Minimal diffuse cortical atrophy is noted. Mild chronic ischemic white matter disease is  noted. No mass effect or midline shift is noted. Ventricular size is within normal limits. There is no evidence of mass lesion, hemorrhage or acute infarction. Vascular: No hyperdense vessel or unexpected calcification. Skull: Normal. Negative for fracture or focal lesion. Sinuses/Orbits: No acute  finding. Other: None. IMPRESSION: Minimal diffuse cortical atrophy. Mild chronic ischemic white matter disease. No acute intracranial abnormality seen. Electronically Signed   By: Lupita Raider, M.D.   On: 05/30/2018 10:50   Mr Brain Wo Contrast  Result Date: 05/30/2018 CLINICAL DATA:  77 y/o M; 1 week of increased confusion and mild headache yesterday. EXAM: MRI HEAD WITHOUT CONTRAST TECHNIQUE: Multiplanar, multiecho pulse sequences of the brain and surrounding structures were obtained without intravenous contrast. COMPARISON:  05/30/2018 CT head. FINDINGS: Brain: Punctate focus of diffusion hyperintensity within the left medial cerebellum without definite reduced ADC, possible acute or subacute microvascular infarction. No associated hemorrhage or mass effect. No abnormal susceptibility hypointensity to indicate intracranial hemorrhage. Few nonspecific foci of T2 FLAIR hyperintense signal abnormality predominantly within periventricular white matter is compatible with mild chronic microvascular ischemic changes. Moderate volume loss of the brain for age. No focal mass effect, extra-axial collection, hydrocephalus, or herniation. Vascular: Normal flow voids. Skull and upper cervical spine: Normal marrow signal. Sinuses/Orbits: Negative.  Bilateral intra-ocular lens replacement. Other: None. IMPRESSION: 1. Possible punctate acute or subacute microvascular infarction within the left medial cerebellum. 2. Mild chronic microvascular ischemic changes and moderate volume loss of the brain for age. Electronically Signed   By: Mitzi Hansen M.D.   On: 05/30/2018 14:15   Mr Maxine Glenn Head/brain ZO Cm  Result Date: 05/31/2018 CLINICAL DATA:  Mild headache, intermittent confusion for weeks. Follow-up LEFT cerebellar infarct. EXAM: MRA HEAD WITHOUT CONTRAST TECHNIQUE: Angiographic images of the Circle of Willis were obtained using MRA technique without intravenous contrast. COMPARISON:  MRI head May 30, 2018  FINDINGS: ANTERIOR CIRCULATION: Normal flow related enhancement of the included cervical, petrous, cavernous and supraclinoid internal carotid arteries. Patent anterior communicating artery. Patent anterior and middle cerebral arteries, symmetric mild MCA signal loss mid segments related to tortuous vessel inflow artifact. No large vessel occlusion, flow limiting stenosis, aneurysm. POSTERIOR CIRCULATION: Codominant vertebral arteries. Vertebrobasilar arteries are patent, with normal flow related enhancement of the main branch vessels. Patent posterior cerebral arteries. No large vessel occlusion, flow limiting stenosis,  aneurysm. ANATOMIC VARIANTS: None. Source images and MIP images were reviewed. IMPRESSION: 1. No emergent large vessel occlusion or flow-limiting stenosis. Electronically Signed   By: Awilda Metro M.D.   On: 05/31/2018 02:24   Ct Head Code Stroke Wo Contrast  Addendum Date: 05/31/2018   ADDENDUM REPORT: 05/31/2018 05:21 ADDENDUM: Critical Value/emergent results were called by telephone at the time of interpretation on 05/31/2018 at 5:16 am to Dr. Barbaraann Rondo , who verbally acknowledged these results. Electronically Signed   By: Awilda Metro M.D.   On: 05/31/2018 05:21   Result Date: 05/31/2018 CLINICAL DATA:  Code stroke.  Aphasia for an hour.  Recent stroke. EXAM: CT HEAD WITHOUT CONTRAST TECHNIQUE: Contiguous axial images were obtained from the base of the skull through the vertex without intravenous contrast. COMPARISON:  MRI of the head May 30, 2018 and CT HEAD May 30, 2018 FINDINGS: BRAIN: No intraparenchymal hemorrhage, mass effect nor midline shift. Moderate parenchymal brain volume loss. No hydrocephalus. Patchy supratentorial white matter hypodensities within normal range for patient's age, though non-specific are most compatible with chronic small vessel ischemic disease. No acute large vascular territory infarcts. No abnormal  extra-axial fluid collections. Basal  cisterns are patent. VASCULAR: Mild calcific atherosclerosis of the carotid siphons. SKULL: No skull fracture. No significant scalp soft tissue swelling. SINUSES/ORBITS: The mastoid air-cells and included paranasal sinuses are well-aerated.The included ocular globes and orbital contents are non-suspicious. Status post bilateral ocular lens implants. OTHER: None. ASPECTS [facility] Stroke Program Early CT Score) - Ganglionic level infarction (caudate, lentiform nuclei, internal capsule, insula, M1-M3 cortex): 7 - Supraganglionic infarction (M4-M6 cortex): 3 Total score (0-10 with 10 being normal): 10 IMPRESSION: 1. No acute intracranial process. 2. Stable parenchymal brain volume loss and moderate chronic small vessel ischemic changes. 3. ASPECTS is 10. Electronically Signed: By: Awilda Metro M.D. On: 05/31/2018 04:58    EKG:   Orders placed or performed during the hospital encounter of 05/30/18  . ED EKG  . ED EKG  . EKG 12-Lead  . EKG 12-Lead  . EKG 12-Lead  . EKG 12-Lead    ASSESSMENT AND PLAN:   77y/o M with PMH significant for Hyperlipidemia, paroxysmal afib not on any anti coagulation, NIDDM brought by daughter secondary to worsening confusion.  1. Acute stroke- MRI brain on adm with punctate left cerebellar infarct - also has moderate volume loss of brain with chronic microvascular ischemic changes -Repeat MRI ordered for this morning due to code stroke due to behavioral changes. MRA with no large vessel occlusion findings -Currently on Plavix.  Also on statin.  Might be a candidate for anticoagulation with NOVACs due to paroxysmal A. Fib -Neurology consult pending.  Continue neurochecks.  Carotid Dopplers and echo -PT/OT and speech consults  2.  Paroxysmal A. fib-according to daughter, patient was told that he had a regular rhythm previously.  There is no documentation of any diagnosis of A. fib on the chart. -This morning patient on the monitor was in atrial fibrillation  rhythm -Rate controlled.  Could add beta-blocker if HR is stable -Recommend anticoagulation after neurology evaluation -Has a cardiologist as outpatient- Dr. Milta Deiters  3.  Acute delirium-due to stroke and also worsen possibly by underlying dementia -Currently on Precedex drip  4.  Diabetes mellitus-on sliding scale insulin a1c only 5.3  5.  DVT prophylaxis-Lovenox  Updated daughter at bedside    All the records are reviewed and case discussed with Care Management/Social Workerr. Management plans discussed with the patient, family and they are in agreement.  CODE STATUS: Full Code  TOTAL TIME TAKING CARE OF THIS PATIENT: 38 minutes.   POSSIBLE D/C IN  2-3  DAYS, DEPENDING ON CLINICAL CONDITION.   Jeanene Mena M.D on 05/31/2018 at 1:18 PM  Between 7am to 6pm - Pager - [phone]  After 6pm go to [email or web address] - password [email or web address]  [facility]  Office  [phone]  CC: Primary care physician; Sherron Monday, MD

## 2018-05-31 NOTE — Progress Notes (Signed)
SLP Cancellation Note  Patient Details Name: Alexander MeadowRaymond E Keep Jr. MRN: [id number] DOB: 04/03/1941   Cancelled treatment:       Reason Eval/Treat Not Completed: Patient at procedure or test/unavailable. Received orders and reviewed chart. Pt is currently out of room for tests. Will re-attempt later today or tomorrow as pt is available.   Lauree ChandlerMaaran [place], [place], McK[employer]onCCC-SLP  Speech-Language Pathologist    [place],Maaran 05/31/2018, 2:05 PM

## 2018-05-31 NOTE — Consult Note (Addendum)
Update:  Head CT is negative for hemorrhage.  ///////////////   Date of Service 05/31/2018  [facility] TeleNeurology Consult Services  Comments: Last time known well:  _ 3:15 Door time:  _inpatient [facility] contacted: _[phone] [facility] at bedside: _342 NIHSS assessment time: _unable consult end time: _422 --------------------------------------------------------------------------- Impression: called for right facial droop, left sided weakness, impaired speech concerning for possible stroke , but during encounter seems to be having delirium, but no obvious facial droop or focal weakness.  Does (not) meet Large Vessel Occlusion (LVO) screening criteria (Aphasia, Neglect, Gaze deviation/preference, Dense hemiparesis, or Visual field deficits on exam), therefore advanced imaging (CTA head and neck and CTP brain) is (not) indicated.   Differential Diagnosis:  1. Cardioembolic stroke  2. Small vessel disease/ lacune  3. Thromboembolic, artery-to-artery mechanism  4. Hypercoagulable state-related infarct  5. Transient ischemic attack  6. Thrombotic mechanism, large artery disease   ------------------------------------------------------------------------------------------- tPA decision and other recommendations:   _ Patient is not a tPA candidate Reason: _ recent stroke (brain MRI 05/30/18) Patient is not a NIR candidate: _ Thrombectomy not considered since large proximal intracranial vessel occlusion is not suspected.  Recommendations STAT head CT dysphagia screen ASA if no contraindications head of bed flat IV fluids NS Stroke work up with: repeat noncontrast brain MRI, 2D ECHO, lipid panel, HbA1c (Goal LDL<70, HbA1c<7)  Delirium precautions:  Blinds open during the day, closed at night, frequent reorientation, minimize nighttime interruptions, when possible avoid benzodiazepines, opioid pain medications, and anticholinergic medications.  If continues to have  delirium consider EEG If any suspicion for an infectious cause, LP should be considered.  inpatient neurology consultation Inpatient stroke evaluation as per Neurology/ Internal Medicine Discussed with medical staff Please contact [employer] Navigator to reach me if further questions/concerns arise.   -------------------------------------------------------------------------------------------- Reason for Stroke Alert and History of Present Illness:  _ Patient is a(n) 77 years old    male  , with history of hypertension, Hyperlipidemia/Hypercholesterolemia  and prediabetes, presented with 1 week history of intermittent worsening confusion and mild headache, found to have possible punctate left cerebellar stroke, also SBP was in 170-180 in ER  At 3:15 he developed right facial droop, left sided weakness and impaired speech.  At my encounter patient was combative and fighting with nursing and his daughter, not cooperating with exam, answers "no" to my questions, moves all four extremities forcefully, does not appear to have a facial droop, Extraocular Movements appears to be intact. Does know his name and month/day of birthdate;   ---------------------------------------------------------------------------------------------- Review of Systems:  Constitutional:  Negative except as documented in history of present illness.   Eye:  Negative except as documented in history of present illness.   Ear/Nose/Mouth/Throat:  Negative except as documented in history of present illness.   Respiratory:  Negative except as documented in history of present illness.   Cardiovascular:  Negative except as documented in history of present illness.   Gastrointestinal:  Negative except as documented in history of present illness.   Musculoskeletal:  Negative except as documented in history of present illness.   Neurologic:  Negative except as documented in history of present illness.     --------------------------------------------------------------------------------------------------- Examination:   NIHSS Details documented in the note At my encounter patient was combative and fighting with nursing and his daughter, not cooperating with exam, answers "no" to my questions, moves all four extremities forcefully, does not appear to have a facial droop, Extraocular Movements appears to be intact. Does know his name and month/day of  birthdate;  He calmed down slightly after IV haloperidol and told us what month it is, also smiled per request and face of symmetric, held arms up for about 5 seconds but then dropped them to the bed. ------------------------------------------------------------------------------------------------------- Medical Decision Making:  - Extensive number of diagnosis or management options are considered above.   - Extensive amount of complex data reviewed.   - High risk of complication and/or morbidity or mortality are associated with differential diagnostic considerations above.  - There may be Uncertain outcome and increased probability of prolonged functional impairment or high probability of severe prolonged functional impairment associated with some of these differential diagnoses.  Medical Data Reviewed:  1.Data reviewed include clinical labs, radiology, Medical Tests;   2.Tests results discussed w/performing or interpreting physician;   3.Obtaining/reviewing old medical records;  4.Obtaining case history from another source;  5.Independent review of image, tracing or specimen.    When possible Patient/family were informed the Neurology Consult would happen via TeleHealth consult by way of interactive audio and video telecommunications and consented to receiving care in this manner.  Case discussed with the Medical staff.  Critical Care notation:  I was called to see this critical patient emergently. I personally evaluated this critical patient  for acute stroke evaluation and determining their eligibility for IV Alteplase and interventional therapies. I have spent approximately _40_ minutes with the patient, including time at bedside, time discussing the case with other physicians, reviewing plan of care, and time independently reviewing the records and scans.

## 2018-05-31 NOTE — Progress Notes (Signed)
[facility] Physician-Brief Progress Note Patient Name: Alexander MeadowRaymond E Pernice Jr. DOB: 05/03/1941 MRN: [id number]   Date of Service  05/31/2018  HPI/Events of Note  Agitated delirium. No history of substance use/ abuse.  eICU Interventions  Haldol 1-2 mg iv Q 6 hrs prn agitation, Precedex infusion for agitation not controlled by Haldol.        Alexander Kline 05/31/2018, 4:33 AM

## 2018-05-31 NOTE — Consult Note (Signed)
Reason for Consult: altered mental status/code stroke Referring Physician: Dr. Robert Bellow. is an 77 y.o. male.   HPI: Alexander Kline is a 77 year old gentleman with a past medical history remarkable for hyperlipidemia and prediabetes,who presented to the emergency department 1 week prior with history of intermittent worsening confusion, yesterday was found to havemore confusion, brought into the emergency department and on CT scan was found to have acute/subacute medial left cerebellar punctate infarct. Code stroke was called,was seen by telephone neurology, was felt not to be a TPA candidate. Developed worsening confusion and Expressive aphasia,was transferred to the intensive care unit where he is now resting on a Precedex infusion. Pending additional MRI/MRA and neurology consultation this morning  Past Medical History:  Diagnosis Date  . Hyperlipidemia   . Pre-diabetes     Past Surgical History:  Procedure Laterality Date  . HIP ARTHROPLASTY Right 09/29/2017   Procedure: ARTHROPLASTY BIPOLAR HIP (HEMIARTHROPLASTY);  Surgeon: Earnestine Leys, MD;  Location: [facility] ORS;  Service: Orthopedics;  Laterality: Right;    Family History  Problem Relation Age of Onset  . CAD Father   . CAD Brother     Social History:  reports that he has never smoked. He has never used smokeless tobacco. He reports that he does not drink alcohol or use drugs.  Allergies:  Allergies  Allergen Reactions  . Sulfa Antibiotics     Per pt, he gets bad headaches with sulfa drugs    Medications: I have reviewed the patient's current medications.  Results for orders placed or performed during the hospital encounter of 05/30/18 (from the past 48 hour(s))  CBC with Differential     Status: Abnormal   Collection Time: 05/30/18 10:52 AM  Result Value Ref Range   WBC 7.2 3.8 - 10.6 K/uL   RBC 3.83 (L) 4.40 - 5.90 MIL/uL   Hemoglobin 12.1 (L) 13.0 - 18.0 g/dL   HCT 35.6 (L) 40.0 - 52.0 %   MCV 92.8  80.0 - 100.0 fL   MCH 31.7 26.0 - 34.0 pg   MCHC 34.2 32.0 - 36.0 g/dL   RDW 15.7 (H) 11.5 - 14.5 %   Platelets 165 150 - 440 K/uL   Neutrophils Relative % 55 %   Neutro Abs 3.9 1.4 - 6.5 K/uL   Lymphocytes Relative 29 %   Lymphs Abs 2.0 1.0 - 3.6 K/uL   Monocytes Relative 7 %   Monocytes Absolute 0.5 0.2 - 1.0 K/uL   Eosinophils Relative 10 %   Eosinophils Absolute 0.7 0 - 0.7 K/uL   Basophils Relative 1 %   Basophils Absolute 0.0 0 - 0.1 K/uL    Comment: Performed at [facility], [address] [place], [place] [address]  Comprehensive metabolic panel     Status: Abnormal   Collection Time: 05/30/18 10:52 AM  Result Value Ref Range   Sodium 142 135 - 145 mmol/L   Potassium 3.9 3.5 - 5.1 mmol/L   Chloride 107 98 - 111 mmol/L   CO2 28 22 - 32 mmol/L   Glucose, Bld 117 (H) 70 - 99 mg/dL   BUN 19 8 - 23 mg/dL   Creatinine, Ser 1.13 0.61 - 1.24 mg/dL   Calcium 9.2 8.9 - 10.3 mg/dL   Total Protein 6.4 (L) 6.5 - 8.1 g/dL   Albumin 3.8 3.5 - 5.0 g/dL   AST 24 15 - 41 U/L   ALT 18 0 - 44 U/L   Alkaline Phosphatase 82 38 -  126 U/L   Total Bilirubin 0.9 0.3 - 1.2 mg/dL   GFR calc non Af Amer >60 >60 mL/min   GFR calc Af Amer >60 >60 mL/min    Comment: (NOTE) The eGFR has been calculated using the CKD EPI equation. This calculation has not been validated in all clinical situations. eGFR's persistently <60 mL/min signify possible Chronic Kidney Disease.    Anion gap 7 5 - 15    Comment: Performed at [facility], [address] [place], [place] [address]  Troponin I     Status: None   Collection Time: 05/30/18 10:52 AM  Result Value Ref Range   Troponin I <0.03 <0.03 ng/mL    Comment: Performed at [facility], [address] [place], [place] [address]  TSH     Status: None   Collection Time: 05/30/18 10:52 AM  Result Value Ref Range   TSH 1.420 0.350 - 4.500 uIU/mL    Comment: Performed by a 3rd Generation assay with a functional sensitivity  of <=0.01 uIU/mL. Performed at [facility], [address] [place], [place] [address]   Urinalysis, Complete w Microscopic     Status: Abnormal   Collection Time: 05/30/18 10:52 AM  Result Value Ref Range   Color, Urine YELLOW (A) YELLOW   APPearance CLEAR (A) CLEAR   Specific Gravity, Urine 1.018 1.005 - 1.030   pH 5.0 5.0 - 8.0   Glucose, UA NEGATIVE NEGATIVE mg/dL   Hgb urine dipstick NEGATIVE NEGATIVE   Bilirubin Urine NEGATIVE NEGATIVE   Ketones, ur 5 (A) NEGATIVE mg/dL   Protein, ur NEGATIVE NEGATIVE mg/dL   Nitrite NEGATIVE NEGATIVE   Leukocytes, UA NEGATIVE NEGATIVE   RBC / HPF 0-5 0 - 5 RBC/hpf   WBC, UA 0-5 0 - 5 WBC/hpf   Bacteria, UA NONE SEEN NONE SEEN   Squamous Epithelial / LPF 0-5 0 - 5   Mucus PRESENT     Comment: Performed at [facility], [address] [place], [place] [address]  CBC     Status: Abnormal   Collection Time: 05/30/18  5:27 PM  Result Value Ref Range   WBC 7.6 3.8 - 10.6 K/uL   RBC 3.91 (L) 4.40 - 5.90 MIL/uL   Hemoglobin 12.4 (L) 13.0 - 18.0 g/dL   HCT 36.1 (L) 40.0 - 52.0 %   MCV 92.1 80.0 - 100.0 fL   MCH 31.7 26.0 - 34.0 pg   MCHC 34.5 32.0 - 36.0 g/dL   RDW 15.7 (H) 11.5 - 14.5 %   Platelets 161 150 - 440 K/uL    Comment: Performed at [facility], [address] [place], [place] [address]  Creatinine, serum     Status: None   Collection Time: 05/30/18  5:27 PM  Result Value Ref Range   Creatinine, Ser 1.12 0.61 - 1.24 mg/dL   GFR calc non Af Amer >60 >60 mL/min   GFR calc Af Amer >60 >60 mL/min    Comment: (NOTE) The eGFR has been calculated using the CKD EPI equation. This calculation has not been validated in all clinical situations. eGFR's persistently <60 mL/min signify possible Chronic Kidney Disease. Performed at [facility], [address] [place], [place] [address]   Glucose, capillary     Status: None   Collection Time: 05/30/18  5:33 PM  Result Value Ref Range    Glucose-Capillary 79 70 - 99 mg/dL  Glucose, capillary     Status: Abnormal  Collection Time: 05/30/18  9:22 PM  Result Value Ref Range   Glucose-Capillary 135 (H) 70 - 99 mg/dL  Lipid panel     Status: Abnormal   Collection Time: 05/31/18  3:27 AM  Result Value Ref Range   Cholesterol 150 0 - 200 mg/dL   Triglycerides 100 <150 mg/dL   HDL 39 (L) >40 mg/dL   Total CHOL/HDL Ratio 3.8 RATIO   VLDL 20 0 - 40 mg/dL   LDL Cholesterol 91 0 - 99 mg/dL    Comment:        Total Cholesterol/HDL:CHD Risk Coronary Heart Disease Risk Table                     Men   Women  1/2 Average Risk   3.4   3.3  Average Risk       5.0   4.4  2 X Average Risk   9.6   7.1  3 X Average Risk  23.4   11.0        Use the calculated Patient Ratio above and the CHD Risk Table to determine the patient's CHD Risk.        ATP III CLASSIFICATION (LDL):  <100     mg/dL   Optimal  100-129  mg/dL   Near or Above                    Optimal  130-159  mg/dL   Borderline  160-189  mg/dL   High  >190     mg/dL   Very High Performed at [address] [place], [place] [address]   Glucose, capillary     Status: Abnormal   Collection Time: 05/31/18  3:28 AM  Result Value Ref Range   Glucose-Capillary 134 (H) 70 - 99 mg/dL  Glucose, capillary     Status: Abnormal   Collection Time: 05/31/18  7:44 AM  Result Value Ref Range   Glucose-Capillary 132 (H) 70 - 99 mg/dL    Dg Chest 2 View  Result Date: 05/30/2018 CLINICAL DATA:  Confusion EXAM: CHEST - 2 VIEW COMPARISON:  Chest radiograph 09/29/2017 FINDINGS: Stable cardiac and mediastinal contours. No consolidative pulmonary opacities. No pleural effusion or pneumothorax. Thoracic spine degenerative changes. Surgical hardware left shoulder. IMPRESSION: No acute cardiopulmonary process. Electronically Signed   By: Lovey Newcomer M.D.   On: 05/30/2018 10:35   Ct Head Wo Contrast  Result Date: 05/30/2018 CLINICAL DATA:  Altered level of  consciousness. EXAM: CT HEAD WITHOUT CONTRAST TECHNIQUE: Contiguous axial images were obtained from the base of the skull through the vertex without intravenous contrast. COMPARISON:  CT scan of Mar 07, 2018. FINDINGS: Brain: Minimal diffuse cortical atrophy is noted. Mild chronic ischemic white matter disease is noted. No mass effect or midline shift is noted. Ventricular size is within normal limits. There is no evidence of mass lesion, hemorrhage or acute infarction. Vascular: No hyperdense vessel or unexpected calcification. Skull: Normal. Negative for fracture or focal lesion. Sinuses/Orbits: No acute finding. Other: None. IMPRESSION: Minimal diffuse cortical atrophy. Mild chronic ischemic white matter disease. No acute intracranial abnormality seen. Electronically Signed   By: Marijo Conception, M.D.   On: 05/30/2018 10:50   Mr Brain Wo Contrast  Result Date: 05/30/2018 CLINICAL DATA:  77 y/o M; 1 week of increased confusion and mild headache yesterday. EXAM: MRI HEAD WITHOUT CONTRAST TECHNIQUE: Multiplanar, multiecho pulse sequences of the brain and surrounding structures were obtained  without intravenous contrast. COMPARISON:  05/30/2018 CT head. FINDINGS: Brain: Punctate focus of diffusion hyperintensity within the left medial cerebellum without definite reduced ADC, possible acute or subacute microvascular infarction. No associated hemorrhage or mass effect. No abnormal susceptibility hypointensity to indicate intracranial hemorrhage. Few nonspecific foci of T2 FLAIR hyperintense signal abnormality predominantly within periventricular white matter is compatible with mild chronic microvascular ischemic changes. Moderate volume loss of the brain for age. No focal mass effect, extra-axial collection, hydrocephalus, or herniation. Vascular: Normal flow voids. Skull and upper cervical spine: Normal marrow signal. Sinuses/Orbits: Negative.  Bilateral intra-ocular lens replacement. Other: None. IMPRESSION: 1.  Possible punctate acute or subacute microvascular infarction within the left medial cerebellum. 2. Mild chronic microvascular ischemic changes and moderate volume loss of the brain for age. Electronically Signed   By: Kristine Garbe M.D.   On: 05/30/2018 14:15   Mr Jodene Nam Head/brain UU Cm  Result Date: 05/31/2018 CLINICAL DATA:  Mild headache, intermittent confusion for weeks. Follow-up LEFT cerebellar infarct. EXAM: MRA HEAD WITHOUT CONTRAST TECHNIQUE: Angiographic images of the Circle of Willis were obtained using MRA technique without intravenous contrast. COMPARISON:  MRI head May 30, 2018 FINDINGS: ANTERIOR CIRCULATION: Normal flow related enhancement of the included cervical, petrous, cavernous and supraclinoid internal carotid arteries. Patent anterior communicating artery. Patent anterior and middle cerebral arteries, symmetric mild MCA signal loss mid segments related to tortuous vessel inflow artifact. No large vessel occlusion, flow limiting stenosis, aneurysm. POSTERIOR CIRCULATION: Codominant vertebral arteries. Vertebrobasilar arteries are patent, with normal flow related enhancement of the main branch vessels. Patent posterior cerebral arteries. No large vessel occlusion, flow limiting stenosis,  aneurysm. ANATOMIC VARIANTS: None. Source images and MIP images were reviewed. IMPRESSION: 1. No emergent large vessel occlusion or flow-limiting stenosis. Electronically Signed   By: Elon Alas M.D.   On: 05/31/2018 02:24   Ct Head Code Stroke Wo Contrast  Addendum Date: 05/31/2018   ADDENDUM REPORT: 05/31/2018 05:21 ADDENDUM: Critical Value/emergent results were called by telephone at the time of interpretation on 05/31/2018 at 5:16 am to Dr. Arta Silence , who verbally acknowledged these results. Electronically Signed   By: Elon Alas M.D.   On: 05/31/2018 05:21   Result Date: 05/31/2018 CLINICAL DATA:  Code stroke.  Aphasia for an hour.  Recent stroke. EXAM: CT HEAD  WITHOUT CONTRAST TECHNIQUE: Contiguous axial images were obtained from the base of the skull through the vertex without intravenous contrast. COMPARISON:  MRI of the head May 30, 2018 and CT HEAD May 30, 2018 FINDINGS: BRAIN: No intraparenchymal hemorrhage, mass effect nor midline shift. Moderate parenchymal brain volume loss. No hydrocephalus. Patchy supratentorial white matter hypodensities within normal range for patient's age, though non-specific are most compatible with chronic small vessel ischemic disease. No acute large vascular territory infarcts. No abnormal extra-axial fluid collections. Basal cisterns are patent. VASCULAR: Mild calcific atherosclerosis of the carotid siphons. SKULL: No skull fracture. No significant scalp soft tissue swelling. SINUSES/ORBITS: The mastoid air-cells and included paranasal sinuses are well-aerated.The included ocular globes and orbital contents are non-suspicious. Status post bilateral ocular lens implants. OTHER: None. ASPECTS [facility] Stroke Program Early CT Score) - Ganglionic level infarction (caudate, lentiform nuclei, internal capsule, insula, M1-M3 cortex): 7 - Supraganglionic infarction (M4-M6 cortex): 3 Total score (0-10 with 10 being normal): 10 IMPRESSION: 1. No acute intracranial process. 2. Stable parenchymal brain volume loss and moderate chronic small vessel ischemic changes. 3. ASPECTS is 10. Electronically Signed: By: Elon Alas M.D. On: 05/31/2018 04:58  ROS Blood pressure 119/77, pulse 84, temperature (!) 97.3 F (36.3 C), temperature source Oral, resp. rate 12, height '5\' 11"'  (1.803 m), weight 186 lb (84.4 kg), SpO2 98 %. Physical Exam Patient is presently sedated on Precedex and resting comfortably Vital signs: Please see the above listed vital signs HEENT: Trachea is midline, no thyromegaly appreciated, bruits not auscultated Cardiovascular: Regular rhythm, bradycardia noted, systolic murmur left parasternal  border Pulmonary: Mild rhonchi appreciated posteriorly Abdominal exam: Positive bowel sounds, soft Extremities: No clubbing cyanosis or edema noted Neurologic: Limited exam as patient is sedated on Precedex, moves all extremities Cutaneous: No rashes or lesions noted  Assessment/Plan:  Acute confusion, expressive aphasia,initial CT scan negative,MRI without contrast revealed  Possible punctate acute or subacute microvascular infarction within the left medial cerebellum  MRI did not reveal any large vessel occlusion  Will continue on Precedex and wean as tolerated. Pending neurology this morning  Alexander Kline 05/31/2018, 8:30 AM

## 2018-05-31 NOTE — Consult Note (Signed)
Referring Physician: Conforti    Chief Complaint: Confusion  HPI: Alexander Kline. is an 77 y.o. male who is currently sedated with Precedex and unable to provide any history.  All history obtained from daughter.  Prior to November of last year the patient lived alone and was fully functional.  Had a fall in November and broke his hip.  Has not been the same since that time.  Continues to live alone and drive but will at times get confused.  Will forget where he is or where he is going.  Will sometimes "glaze" over.  Has had episodes when he is unable to speak and unable to read.  These episodes are intermittent and he appears to know when he is confused.  About 6 weeks ago this seemed to get worse and has progressed since that time.  Was brought in due to confusion.  Overnight had increasing confusion then was noted to have right facial droop, left sided weakness and slurred speech.  Code stroke was called.  Neurologist evaluating patient felt that the patient was delirious.  No tPA administered and patient was transferred to the ICU.    Date last known well: Unable to determine Time last known well: Unable to determine tPA Given: No: Unable to determine LKW, recent infarct  Past Medical History:  Diagnosis Date  . Hyperlipidemia   . Pre-diabetes     Past Surgical History:  Procedure Laterality Date  . HIP ARTHROPLASTY Right 09/29/2017   Procedure: ARTHROPLASTY BIPOLAR HIP (HEMIARTHROPLASTY);  Surgeon: Deeann Saint, MD;  Location: [facility] ORS;  Service: Orthopedics;  Laterality: Right;    Family History  Problem Relation Age of Onset  . CAD Father   . CAD Brother    Social History:  reports that he has never smoked. He has never used smokeless tobacco. He reports that he does not drink alcohol or use drugs.  Allergies:  Allergies  Allergen Reactions  . Sulfa Antibiotics     Per pt, he gets bad headaches with sulfa drugs    Medications:  I have reviewed the patient's current  medications. Prior to Admission:  Medications Prior to Admission  Medication Sig Dispense Refill Last Dose  . acetaminophen (TYLENOL) 325 MG tablet Take 2 tablets (650 mg total) by mouth every 6 (six) hours as needed for mild pain (or Fever >/= 101). 15 tablet 0 PRN at PRN  . atorvastatin (LIPITOR) 40 MG tablet Take 40 mg by mouth daily.   Unknown at Unknown  . enalapril (VASOTEC) 2.5 MG tablet Take 2.5 mg by mouth daily.   Unknown at Unknown  . meloxicam (MOBIC) 15 MG tablet Take 15 mg by mouth daily.  3 PRN at PRN  . metFORMIN (GLUCOPHAGE-XR) 500 MG 24 hr tablet Take 500 mg by mouth daily.   Unknown at Unknown  . methocarbamol (ROBAXIN) 500 MG tablet Take 500 mg by mouth 2 (two) times daily.  1 PRN at PRN  . ferrous sulfate 325 (65 FE) MG tablet Take 1 tablet (325 mg total) by mouth 2 (two) times daily with a meal. (Patient not taking: Reported on 05/30/2018) 100 tablet 2 Not Taking at Unknown time  . pantoprazole (PROTONIX) 40 MG tablet Take 1 tablet (40 mg total) by mouth daily. (Patient not taking: Reported on 05/30/2018) 30 tablet 1 Not Taking at Unknown time   Scheduled: . amLODipine  10 mg Oral Daily  . atorvastatin  80 mg Oral Daily  . clopidogrel  75 mg Oral Daily  .  enalapril  2.5 mg Oral Daily  . enoxaparin (LOVENOX) injection  40 mg Subcutaneous Q24H  . insulin aspart  0-5 Units Subcutaneous QHS  . insulin aspart  0-9 Units Subcutaneous TID WC  . pantoprazole  40 mg Oral Daily    ROS: Unable to provide due to sedation  Physical Examination: Blood pressure 129/79, pulse 64, temperature 97.8 F (36.6 C), temperature source Oral, resp. rate 10, height 5\' 11"  (1.803 m), weight 84.4 kg (186 lb), SpO2 96 %.  HEENT-  Normocephalic, no lesions, without obvious abnormality.  Normal external eye and conjunctiva.  Normal TM's bilaterally.  Normal auditory canals and external ears. Normal external nose, mucus membranes and septum.  Normal pharynx. Cardiovascular- S1, S2 normal, pulses  palpable throughout   Lungs- chest clear, no wheezing, rales, normal symmetric air entry Abdomen- soft, non-tender; bowel sounds normal; no masses,  no organomegaly Extremities- mild edema Lymph-no adenopathy palpable Musculoskeletal-no joint tenderness, deformity or swelling Skin-warm and dry, no hyperpigmentation, vitiligo, or suspicious lesions  Neurological Examination   Mental Status: Lethargic.  Uncooperative and somewhat combative.  Speech minimal.   Cranial Nerves: II: Discs flat bilaterally; Pupils equal, round, reactive to light and accommodation III,IV, VI: Intact oculocephalic maneuvers V,VII: corneals intact bilaterally VIII: hearing normal bilaterally IX,X: gag reflex present XI: bilateral shoulder shrug XII: midline tongue extension Motor: Moves all extremities against gravity Sensory: Appreciates noxious stimuli throughout Deep Tendon Reflexes: 2+ in the upper extremities and absent in the lower extremities Plantars: Unable to perform secondary to level of cooperation Cerebellar: Unable to test Gait: unable to test   Laboratory Studies:  Basic Metabolic Panel: Recent Labs  Lab 05/30/18 1052 05/30/18 1727 05/31/18 1052  NA 142  --  140  K 3.9  --  4.0  CL 107  --  109  CO2 28  --  26  GLUCOSE 117*  --  134*  BUN 19  --  18  CREATININE 1.13 1.12 0.86  CALCIUM 9.2  --  9.0    Liver Function Tests: Recent Labs  Lab 05/30/18 1052  AST 24  ALT 18  ALKPHOS 82  BILITOT 0.9  PROT 6.4*  ALBUMIN 3.8   No results for input(s): LIPASE, AMYLASE in the last 168 hours. No results for input(s): AMMONIA in the last 168 hours.  CBC: Recent Labs  Lab 05/30/18 1052 05/30/18 1727  WBC 7.2 7.6  NEUTROABS 3.9  --   HGB 12.1* 12.4*  HCT 35.6* 36.1*  MCV 92.8 92.1  PLT 165 161    Cardiac Enzymes: Recent Labs  Lab 05/30/18 1052  TROPONINI <0.03    BNP: Invalid input(s): POCBNP  CBG: Recent Labs  Lab 05/30/18 1733 05/30/18 2122 05/31/18 0328  05/31/18 0744 05/31/18 1208  GLUCAP 79 135* 134* 132* 128*    Microbiology: Results for orders placed or performed during the hospital encounter of 09/28/17  Surgical PCR screen     Status: Abnormal   Collection Time: 09/28/17  5:43 PM  Result Value Ref Range Status   MRSA, PCR NEGATIVE NEGATIVE Final   Staphylococcus aureus POSITIVE (A) NEGATIVE Final    Comment: (NOTE) The Xpert SA Assay (FDA approved for NASAL specimens in patients 65 years of age and older), is one component of a comprehensive surveillance program. It is not intended to diagnose infection nor to guide or monitor treatment.   CULTURE, BLOOD (ROUTINE X 2) w Reflex to ID Panel     Status: None   Collection Time: 09/29/17  8:06 AM  Result Value Ref Range Status   Specimen Description BLOOD RIGHT ANTECUBITAL  Final   Special Requests   Final    BOTTLES DRAWN AEROBIC AND ANAEROBIC Blood Culture adequate volume   Culture NO GROWTH 5 DAYS  Final   Report Status 10/04/2017 FINAL  Final  CULTURE, BLOOD (ROUTINE X 2) w Reflex to ID Panel     Status: None   Collection Time: 09/29/17  8:13 AM  Result Value Ref Range Status   Specimen Description BLOOD RESISTANT HAND  Final   Special Requests   Final    BOTTLES DRAWN AEROBIC AND ANAEROBIC Blood Culture adequate volume   Culture NO GROWTH 5 DAYS  Final   Report Status 10/04/2017 FINAL  Final  Urine Culture     Status: None   Collection Time: 09/30/17  1:40 PM  Result Value Ref Range Status   Specimen Description URINE, RANDOM  Final   Special Requests NONE  Final   Culture   Final    NO GROWTH Performed at [facility], [address] [place], [place] [address]    Report Status 10/02/2017 FINAL  Final    Coagulation Studies: No results for input(s): LABPROT, INR in the last 72 hours.  Urinalysis:  Recent Labs  Lab 05/30/18 1052  COLORURINE YELLOW*  LABSPEC 1.018  PHURINE 5.0  GLUCOSEU NEGATIVE  HGBUR NEGATIVE  BILIRUBINUR NEGATIVE  KETONESUR 5*   PROTEINUR NEGATIVE  NITRITE NEGATIVE  LEUKOCYTESUR NEGATIVE    Lipid Panel:    Component Value Date/Time   CHOL 150 05/31/2018 0327   TRIG 100 05/31/2018 0327   HDL 39 (L) 05/31/2018 0327   CHOLHDL 3.8 05/31/2018 0327   VLDL 20 05/31/2018 0327   LDLCALC 91 05/31/2018 0327    HgbA1C:  Lab Results  Component Value Date   HGBA1C 5.3 05/31/2018    Urine Drug Screen:  No results found for: LABOPIA, COCAINSCRNUR, LABBENZ, AMPHETMU, THCU, LABBARB  Alcohol Level: No results for input(s): ETH in the last 168 hours.  Other results: EKG: sinus bradycardia at 57 bpm.  Imaging: Dg Chest 2 View  Result Date: 05/30/2018 CLINICAL DATA:  Confusion EXAM: CHEST - 2 VIEW COMPARISON:  Chest radiograph 09/29/2017 FINDINGS: Stable cardiac and mediastinal contours. No consolidative pulmonary opacities. No pleural effusion or pneumothorax. Thoracic spine degenerative changes. Surgical hardware left shoulder. IMPRESSION: No acute cardiopulmonary process. Electronically Signed   By: Annia Beltrew  Davis M.D.   On: 05/30/2018 10:35   Ct Head Wo Contrast  Result Date: 05/30/2018 CLINICAL DATA:  Altered level of consciousness. EXAM: CT HEAD WITHOUT CONTRAST TECHNIQUE: Contiguous axial images were obtained from the base of the skull through the vertex without intravenous contrast. COMPARISON:  CT scan of Mar 07, 2018. FINDINGS: Brain: Minimal diffuse cortical atrophy is noted. Mild chronic ischemic white matter disease is noted. No mass effect or midline shift is noted. Ventricular size is within normal limits. There is no evidence of mass lesion, hemorrhage or acute infarction. Vascular: No hyperdense vessel or unexpected calcification. Skull: Normal. Negative for fracture or focal lesion. Sinuses/Orbits: No acute finding. Other: None. IMPRESSION: Minimal diffuse cortical atrophy. Mild chronic ischemic white matter disease. No acute intracranial abnormality seen. Electronically Signed   By: Lupita RaiderJames  Green Jr, M.D.   On:  05/30/2018 10:50   Mr Brain Wo Contrast  Result Date: 05/30/2018 CLINICAL DATA:  77 y/o M; 1 week of increased confusion and mild headache yesterday. EXAM: MRI HEAD WITHOUT CONTRAST TECHNIQUE: Multiplanar, multiecho  pulse sequences of the brain and surrounding structures were obtained without intravenous contrast. COMPARISON:  05/30/2018 CT head. FINDINGS: Brain: Punctate focus of diffusion hyperintensity within the left medial cerebellum without definite reduced ADC, possible acute or subacute microvascular infarction. No associated hemorrhage or mass effect. No abnormal susceptibility hypointensity to indicate intracranial hemorrhage. Few nonspecific foci of T2 FLAIR hyperintense signal abnormality predominantly within periventricular white matter is compatible with mild chronic microvascular ischemic changes. Moderate volume loss of the brain for age. No focal mass effect, extra-axial collection, hydrocephalus, or herniation. Vascular: Normal flow voids. Skull and upper cervical spine: Normal marrow signal. Sinuses/Orbits: Negative.  Bilateral intra-ocular lens replacement. Other: None. IMPRESSION: 1. Possible punctate acute or subacute microvascular infarction within the left medial cerebellum. 2. Mild chronic microvascular ischemic changes and moderate volume loss of the brain for age. Electronically Signed   By: Mitzi Hansen M.D.   On: 05/30/2018 14:15   Mr Maxine Glenn Head/brain ZO Cm  Result Date: 05/31/2018 CLINICAL DATA:  Mild headache, intermittent confusion for weeks. Follow-up LEFT cerebellar infarct. EXAM: MRA HEAD WITHOUT CONTRAST TECHNIQUE: Angiographic images of the Circle of Willis were obtained using MRA technique without intravenous contrast. COMPARISON:  MRI head May 30, 2018 FINDINGS: ANTERIOR CIRCULATION: Normal flow related enhancement of the included cervical, petrous, cavernous and supraclinoid internal carotid arteries. Patent anterior communicating artery. Patent anterior and  middle cerebral arteries, symmetric mild MCA signal loss mid segments related to tortuous vessel inflow artifact. No large vessel occlusion, flow limiting stenosis, aneurysm. POSTERIOR CIRCULATION: Codominant vertebral arteries. Vertebrobasilar arteries are patent, with normal flow related enhancement of the main branch vessels. Patent posterior cerebral arteries. No large vessel occlusion, flow limiting stenosis,  aneurysm. ANATOMIC VARIANTS: None. Source images and MIP images were reviewed. IMPRESSION: 1. No emergent large vessel occlusion or flow-limiting stenosis. Electronically Signed   By: Awilda Metro M.D.   On: 05/31/2018 02:24   Ct Head Code Stroke Wo Contrast  Addendum Date: 05/31/2018   ADDENDUM REPORT: 05/31/2018 05:21 ADDENDUM: Critical Value/emergent results were called by telephone at the time of interpretation on 05/31/2018 at 5:16 am to Dr. Barbaraann Rondo , who verbally acknowledged these results. Electronically Signed   By: Awilda Metro M.D.   On: 05/31/2018 05:21   Result Date: 05/31/2018 CLINICAL DATA:  Code stroke.  Aphasia for an hour.  Recent stroke. EXAM: CT HEAD WITHOUT CONTRAST TECHNIQUE: Contiguous axial images were obtained from the base of the skull through the vertex without intravenous contrast. COMPARISON:  MRI of the head May 30, 2018 and CT HEAD May 30, 2018 FINDINGS: BRAIN: No intraparenchymal hemorrhage, mass effect nor midline shift. Moderate parenchymal brain volume loss. No hydrocephalus. Patchy supratentorial white matter hypodensities within normal range for patient's age, though non-specific are most compatible with chronic small vessel ischemic disease. No acute large vascular territory infarcts. No abnormal extra-axial fluid collections. Basal cisterns are patent. VASCULAR: Mild calcific atherosclerosis of the carotid siphons. SKULL: No skull fracture. No significant scalp soft tissue swelling. SINUSES/ORBITS: The mastoid air-cells and included  paranasal sinuses are well-aerated.The included ocular globes and orbital contents are non-suspicious. Status post bilateral ocular lens implants. OTHER: None. ASPECTS [facility] Stroke Program Early CT Score) - Ganglionic level infarction (caudate, lentiform nuclei, internal capsule, insula, M1-M3 cortex): 7 - Supraganglionic infarction (M4-M6 cortex): 3 Total score (0-10 with 10 being normal): 10 IMPRESSION: 1. No acute intracranial process. 2. Stable parenchymal brain volume loss and moderate chronic small vessel ischemic changes. 3. ASPECTS is 10. Electronically Signed: By:  Awilda Metro M.D. On: 05/31/2018 04:58    Assessment: 77 y.o. male presenting with episodic confusion, worsening for the past 6 weeks.  Has had some episodic focal symptoms and starring spells as well.  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  MRA unrenarkable.  Patient on Plavix prior to admission.  Suspect embolic etiology.  This alone does not explain patient's presentation though.  Patient has gone downhill since hip fracture which can often be seen in the elderly.  Will rule out other possibilities as well.   A1c 5.3, LDL 91.   Stroke Risk Factors - family history and hyperlipidemia  Plan: 1. Aggressive lipid management with target LDL<70. 2. Continue Plavix 75mg  and start ASA 81mg  daily 3. PT consult, OT consult, Speech consult 4. Echocardiogram 5. Carotid dopplers 6. Repeat MRI pending 7. NPO until RN stroke swallow screen 8. Telemetry monitoring 9. Frequent neuro checks 10. EEG   Thana Farr, MD Neurology [phone] 05/31/2018, 12:24 PM

## 2018-05-31 NOTE — Progress Notes (Signed)
Code Stroke for acute neurological change. Case d/w radiologist, CT + MRI (-) acute findings. Appears to be having recurrent symptoms. Echo pending. Need for systemic anticoagulation presently unclear.  -P. Jayvien Rowlette (Nocturnist)

## 2018-05-31 NOTE — Progress Notes (Signed)
   05/31/18 1340  Clinical Encounter Type  Visited With Patient not available  Visit Type Follow-up   Chaplain attempted visit; room empty, offered silent and energetic prayers for patient and care team.

## 2018-06-01 ENCOUNTER — Inpatient Hospital Stay (HOSPITAL_COMMUNITY)
Admit: 2018-06-01 | Discharge: 2018-06-01 | Disposition: A | Discharge status: Home / Self Care | Payer: Medicare Other | Attending: Internal Medicine | Admitting: Internal Medicine

## 2018-06-01 ENCOUNTER — Other Ambulatory Visit: Payer: Self-pay

## 2018-06-01 DIAGNOSIS — I639 Cerebral infarction, unspecified: Secondary | ICD-10-CM

## 2018-06-01 DIAGNOSIS — I371 Nonrheumatic pulmonary valve insufficiency: Secondary | ICD-10-CM

## 2018-06-01 LAB — GLUCOSE, CAPILLARY
GLUCOSE-CAPILLARY: 88 mg/dL (ref 70–99)
Glucose-Capillary: 80 mg/dL (ref 70–99)
Glucose-Capillary: 115 mg/dL — ABNORMAL HIGH (ref 70–99)
Glucose-Capillary: 123 mg/dL — ABNORMAL HIGH (ref 70–99)

## 2018-06-01 MED ORDER — ASPIRIN 81 MG PO CHEW
81.0000 mg | CHEWABLE_TABLET | Freq: Every day | ORAL | Status: DC
  Administered 2018-06-01 – 2018-06-04 (×4): 81 mg via ORAL
  Filled 2018-06-01 (×4): qty 1

## 2018-06-01 MED ORDER — LORAZEPAM 2 MG/ML IJ SOLN
1.0000 mg | Freq: Once | INTRAMUSCULAR | Status: AC
  Administered 2018-06-01: 1 mg via INTRAVENOUS
  Filled 2018-06-01: qty 1

## 2018-06-01 NOTE — Progress Notes (Signed)
Follow up - Critical Care Medicine Note  Patient Details:    Alexander Severin. is an 77 y.o. male.with a past medical history remarkable for hyperlipidemia and prediabetes,who presented to the emergency department 1 week prior with history of intermittent worsening confusion, yesterday was found to havemore confusion, brought into the emergency department and on CT scan was found to have acute/subacute medial left cerebellar punctate infarct. Code stroke was called,was seen by telephone neurology, was felt not to be a TPA candidate. Developed worsening confusion and Expressive aphasia,was transferred to the intensive care unit where he is now resting on a Precedex infusion.   Lines, Airways, Drains: External Urinary Catheter (Active)  Collection Container Standard drainage bag 06/01/2018  2:00 AM  Securement Method Securing device (Describe) 06/01/2018  2:00 AM  Output (mL) 250 mL 06/01/2018  6:00 AM    Anti-infectives:  Anti-infectives (From admission, onward)   None      Microbiology: Results for orders placed or performed during the hospital encounter of 09/28/17  Surgical PCR screen     Status: Abnormal   Collection Time: 09/28/17  5:43 PM  Result Value Ref Range Status   MRSA, PCR NEGATIVE NEGATIVE Final   Staphylococcus aureus POSITIVE (A) NEGATIVE Final    Comment: (NOTE) The Xpert SA Assay (FDA approved for NASAL specimens in patients 7 years of age and older), is one component of a comprehensive surveillance program. It is not intended to diagnose infection nor to guide or monitor treatment.   CULTURE, BLOOD (ROUTINE X 2) w Reflex to ID Panel     Status: None   Collection Time: 09/29/17  8:06 AM  Result Value Ref Range Status   Specimen Description BLOOD RIGHT ANTECUBITAL  Final   Special Requests   Final    BOTTLES DRAWN AEROBIC AND ANAEROBIC Blood Culture adequate volume   Culture NO GROWTH 5 DAYS  Final   Report Status 10/04/2017 FINAL  Final  CULTURE, BLOOD (ROUTINE X  2) w Reflex to ID Panel     Status: None   Collection Time: 09/29/17  8:13 AM  Result Value Ref Range Status   Specimen Description BLOOD RESISTANT HAND  Final   Special Requests   Final    BOTTLES DRAWN AEROBIC AND ANAEROBIC Blood Culture adequate volume   Culture NO GROWTH 5 DAYS  Final   Report Status 10/04/2017 FINAL  Final  Urine Culture     Status: None   Collection Time: 09/30/17  1:40 PM  Result Value Ref Range Status   Specimen Description URINE, RANDOM  Final   Special Requests NONE  Final   Culture   Final    NO GROWTH Performed at [facility], [address], [place], [place] [address]    Report Status 10/02/2017 FINAL  Final   Studies: Dg Chest 2 View  Result Date: 05/30/2018 CLINICAL DATA:  Confusion EXAM: CHEST - 2 VIEW COMPARISON:  Chest radiograph 09/29/2017 FINDINGS: Stable cardiac and mediastinal contours. No consolidative pulmonary opacities. No pleural effusion or pneumothorax. Thoracic spine degenerative changes. Surgical hardware left shoulder. IMPRESSION: No acute cardiopulmonary process. Electronically Signed   By: Annia Belt M.D.   On: 05/30/2018 10:35   Ct Head Wo Contrast  Result Date: 05/30/2018 CLINICAL DATA:  Altered level of consciousness. EXAM: CT HEAD WITHOUT CONTRAST TECHNIQUE: Contiguous axial images were obtained from the base of the skull through the vertex without intravenous contrast. COMPARISON:  CT scan of Mar 07, 2018. FINDINGS: Brain: Minimal  diffuse cortical atrophy is noted. Mild chronic ischemic white matter disease is noted. No mass effect or midline shift is noted. Ventricular size is within normal limits. There is no evidence of mass lesion, hemorrhage or acute infarction. Vascular: No hyperdense vessel or unexpected calcification. Skull: Normal. Negative for fracture or focal lesion. Sinuses/Orbits: No acute finding. Other: None. IMPRESSION: Minimal diffuse cortical atrophy. Mild chronic ischemic white matter disease. No acute  intracranial abnormality seen. Electronically Signed   By: Lupita Raider, M.D.   On: 05/30/2018 10:50   Mr Brain Wo Contrast  Result Date: 05/30/2018 CLINICAL DATA:  77 y/o M; 1 week of increased confusion and mild headache yesterday. EXAM: MRI HEAD WITHOUT CONTRAST TECHNIQUE: Multiplanar, multiecho pulse sequences of the brain and surrounding structures were obtained without intravenous contrast. COMPARISON:  05/30/2018 CT head. FINDINGS: Brain: Punctate focus of diffusion hyperintensity within the left medial cerebellum without definite reduced ADC, possible acute or subacute microvascular infarction. No associated hemorrhage or mass effect. No abnormal susceptibility hypointensity to indicate intracranial hemorrhage. Few nonspecific foci of T2 FLAIR hyperintense signal abnormality predominantly within periventricular white matter is compatible with mild chronic microvascular ischemic changes. Moderate volume loss of the brain for age. No focal mass effect, extra-axial collection, hydrocephalus, or herniation. Vascular: Normal flow voids. Skull and upper cervical spine: Normal marrow signal. Sinuses/Orbits: Negative.  Bilateral intra-ocular lens replacement. Other: None. IMPRESSION: 1. Possible punctate acute or subacute microvascular infarction within the left medial cerebellum. 2. Mild chronic microvascular ischemic changes and moderate volume loss of the brain for age. Electronically Signed   By: Mitzi Hansen M.D.   On: 05/30/2018 14:15   US Carotid Bilateral (at [facility] Only)  Result Date: 05/31/2018 CLINICAL DATA:  Cerebral infarction and hyperlipidemia. EXAM: BILATERAL CAROTID DUPLEX ULTRASOUND TECHNIQUE: Wallace Cullens scale imaging, color Doppler and duplex ultrasound were performed of bilateral carotid and vertebral arteries in the neck. COMPARISON:  None. FINDINGS: Criteria: Quantification of carotid stenosis is based on velocity parameters that correlate the residual internal carotid  diameter with NASCET-based stenosis levels, using the diameter of the distal internal carotid lumen as the denominator for stenosis measurement. The following velocity measurements were obtained: RIGHT ICA:  64/12 cm/sec CCA:  83/13 cm/sec SYSTOLIC ICA/CCA RATIO:  0.8 ECA:  151 cm/sec LEFT ICA:  72/18 cm/sec CCA:  74/15 cm/sec SYSTOLIC ICA/CCA RATIO:  1.0 ECA:  100 cm/sec RIGHT CAROTID ARTERY: Common carotid artery is tortuous. There is a mild amount of noncalcified plaque at the level of the carotid bulb and extending to the ICA origin. No evidence of significant right ICA stenosis. Estimated right ICA stenosis is less than 50%. RIGHT VERTEBRAL ARTERY: Antegrade flow with normal waveform and velocity. LEFT CAROTID ARTERY: There is a mild amount of noncalcified plaque at the level of the carotid bulb and proximal left ICA. Estimated left ICA stenosis is less than 50%. LEFT VERTEBRAL ARTERY: Antegrade flow with normal waveform and velocity. IMPRESSION: Mild amount of plaque at the level of both carotid bulbs and proximal internal carotid arteries. Estimated bilateral ICA stenoses are less than 50%. Electronically Signed   By: Irish Lack M.D.   On: 05/31/2018 15:35   Mr Maxine Glenn Head/brain ZO Cm  Result Date: 05/31/2018 CLINICAL DATA:  Mild headache, intermittent confusion for weeks. Follow-up LEFT cerebellar infarct. EXAM: MRA HEAD WITHOUT CONTRAST TECHNIQUE: Angiographic images of the Circle of Willis were obtained using MRA technique without intravenous contrast. COMPARISON:  MRI head May 30, 2018 FINDINGS: ANTERIOR CIRCULATION: Normal flow  related enhancement of the included cervical, petrous, cavernous and supraclinoid internal carotid arteries. Patent anterior communicating artery. Patent anterior and middle cerebral arteries, symmetric mild MCA signal loss mid segments related to tortuous vessel inflow artifact. No large vessel occlusion, flow limiting stenosis, aneurysm. POSTERIOR CIRCULATION: Codominant  vertebral arteries. Vertebrobasilar arteries are patent, with normal flow related enhancement of the main branch vessels. Patent posterior cerebral arteries. No large vessel occlusion, flow limiting stenosis,  aneurysm. ANATOMIC VARIANTS: None. Source images and MIP images were reviewed. IMPRESSION: 1. No emergent large vessel occlusion or flow-limiting stenosis. Electronically Signed   By: Awilda Metroourtnay  Bloomer M.D.   On: 05/31/2018 02:24   Ct Head Code Stroke Wo Contrast  Addendum Date: 05/31/2018   ADDENDUM REPORT: 05/31/2018 05:21 ADDENDUM: Critical Value/emergent results were called by telephone at the time of interpretation on 05/31/2018 at 5:16 am to Dr. Barbaraann RondoPRASANNA SRIDHARAN , who verbally acknowledged these results. Electronically Signed   By: Awilda Metroourtnay  Bloomer M.D.   On: 05/31/2018 05:21   Result Date: 05/31/2018 CLINICAL DATA:  Code stroke.  Aphasia for an hour.  Recent stroke. EXAM: CT HEAD WITHOUT CONTRAST TECHNIQUE: Contiguous axial images were obtained from the base of the skull through the vertex without intravenous contrast. COMPARISON:  MRI of the head May 30, 2018 and CT HEAD May 30, 2018 FINDINGS: BRAIN: No intraparenchymal hemorrhage, mass effect nor midline shift. Moderate parenchymal brain volume loss. No hydrocephalus. Patchy supratentorial white matter hypodensities within normal range for patient's age, though non-specific are most compatible with chronic small vessel ischemic disease. No acute large vascular territory infarcts. No abnormal extra-axial fluid collections. Basal cisterns are patent. VASCULAR: Mild calcific atherosclerosis of the carotid siphons. SKULL: No skull fracture. No significant scalp soft tissue swelling. SINUSES/ORBITS: The mastoid air-cells and included paranasal sinuses are well-aerated.The included ocular globes and orbital contents are non-suspicious. Status post bilateral ocular lens implants. OTHER: None. ASPECTS [facility]([place] Stroke Program Early CT Score) -  Ganglionic level infarction (caudate, lentiform nuclei, internal capsule, insula, M1-M3 cortex): 7 - Supraganglionic infarction (M4-M6 cortex): 3 Total score (0-10 with 10 being normal): 10 IMPRESSION: 1. No acute intracranial process. 2. Stable parenchymal brain volume loss and moderate chronic small vessel ischemic changes. 3. ASPECTS is 10. Electronically Signed: By: Awilda Metroourtnay  Bloomer M.D. On: 05/31/2018 04:58    Consults: Treatment Team:  Thana Farreynolds, Leslie, MD   Subjective:    Overnight Issues: continued to be confused, on Precedex with as needed Haldol and Ativan. Pending repeat MRI  Objective:  Vital signs for last 24 hours: Temp:  [97.7 F (36.5 C)-98.7 F (37.1 C)] 98.1 F (36.7 C) (08/03 0821) Pulse Rate:  [32-144] 75 (08/03 0823) Resp:  [6-22] 13 (08/03 0900) BP: (117-176)/(60-94) 175/84 (08/03 0900) SpO2:  [92 %-100 %] 99 % (08/03 0900)  Hemodynamic parameters for last 24 hours:    Intake/Output from previous day: 08/02 0701 - 08/03 0700 In: 2617 [I.V.:2617] Out: 800 [Urine:800]  Intake/Output this shift: Total I/O In: 200 [I.V.:200] Out: -   Vent settings for last 24 hours:    Physical Exam:  Patient is presently sedated on Precedex and resting comfortably Vital signs:       Please see the above listed vital signs HEENT:           Trachea is midline, no thyromegaly appreciated, bruits not auscultated Cardiovascular:           Regular rhythm, bradycardia noted, systolic murmur left parasternal border Pulmonary:      Mild rhonchi appreciated  posteriorly Abdominal exam:        Positive bowel sounds, soft Extremities:     No clubbing cyanosis or edema noted Neurologic:      Limited exam as patient is sedated on Precedex, moves all extremities Cutaneous:      No rashes or lesions noted   Assessment/Plan:   Acute punctate left cerebellar infarct.per neurology pending repeat MRI, on aspirin and Plavix, PT/OT/echo. Carotid ultrasound noted  Confusion. Patient  has had a significant amount of confusion, presently on Precedex, has received Haldol and Ativan   Ismelda Weatherman 06/01/2018  *Care during the described time interval was provided by me and/or other providers on the critical care team.  I have reviewed this patient's available data, including medical history, events of note, physical examination and test results as part of my evaluation.

## 2018-06-01 NOTE — Evaluation (Signed)
Clinical/Bedside Swallow Evaluation Patient Details  Name: Alexander Kline. MRN: [id number] Date of Birth: [birth year]  Today's Date: 06/01/2018 Time: SLP Start Time (ACUTE ONLY): 0945 SLP Stop Time (ACUTE ONLY): 1035 SLP Time Calculation (min) (ACUTE ONLY): 50 min  Past Medical History:  Past Medical History:  Diagnosis Date  . Hyperlipidemia   . Pre-diabetes    Past Surgical History:  Past Surgical History:  Procedure Laterality Date  . HIP ARTHROPLASTY Right 09/29/2017   Procedure: ARTHROPLASTY BIPOLAR HIP (HEMIARTHROPLASTY);  Surgeon: Deeann Saint, MD;  Location: [facility] ORS;  Service: Orthopedics;  Laterality: Right;   HPI:  Alexander Kline  is a 77 y.o. male with a known history per below presented to the emergency room with 1 week history of intermittent worsening confusion, earlier today (05/30/2018) the patient was supposed to meet his daughter somewhere and was found at local [employer] instead, patient also with mild headache, in the emergency room patient was found to have acute/subacute medial left cerebellar punctate infarct, urinalysis consistent with dehydration/noted ketones, blood pressure uncontrolled with systolic blood pressure 1 70-1 80 systolic, patient evaluated emergency room, wife at the bedside, patient in no apparent distress, resting comfortably, patient now be admitted for acute cerebrovascular accident with acute confusion and accelerated hypertension with headache.  Patient had increased confusion overnight (05/30/2018) and was transferred to ICU.  Nursing reports patient is more alert, verbal,  and is following commands   Assessment / Plan / Recommendation Clinical Impression  77 year old man admitted 05/30/2018 with possible acute left medial cerebellar infarct and increased confusion overnight is presenting with mild oropharyngeal dysphagia characterized by slowed oral management of solids, adequate mastication of cracker with peanut butter, and cough with  straw sips thin liquid.  Recommend dysphagia 3 diet with thin liquids, no straw and meds in applesauce.  Patient has weak URE, will need assistance with holding a cup.  Patient stated he was full after 3-4 bites, he may not want to eat much.  SLP will continue to monitor. SLP Visit Diagnosis: Dysphagia, oropharyngeal phase (R13.12)    Aspiration Risk  Mild aspiration risk    Diet Recommendation Dysphagia 3 (Mech soft);Thin liquid   Liquid Administration via: Cup;No straw Medication Administration: Whole meds with puree Supervision: Staff to assist with self feeding    Other  Recommendations     Follow up Recommendations        Frequency and Duration min 3x week  1 week       Prognosis        Swallow Study   General Date of Onset: 05/30/18 HPI: Alexander Kline  is a 77 y.o. male with a known history per below presented to the emergency room with 1 week history of intermittent worsening confusion, earlier today (05/30/2018) the patient was supposed to meet his daughter somewhere and was found at local [employer] instead, patient also with mild headache, in the emergency room patient was found to have acute/subacute medial left cerebellar punctate infarct, urinalysis consistent with dehydration/noted ketones, blood pressure uncontrolled with systolic blood pressure 1 70-1 80 systolic, patient evaluated emergency room, wife at the bedside, patient in no apparent distress, resting comfortably, patient now be admitted for acute cerebrovascular accident with acute confusion and accelerated hypertension with headache.  Patient had increased confusion overnight (05/30/2018) and was transferred to ICU. Type of Study: Bedside Swallow Evaluation Diet Prior to this Study: NPO Behavior/Cognition: Lethargic/Drowsy;Cooperative Self-Feeding Abilities: Needs assist;Other (Comment)(Right hand weak) Patient Positioning: Upright in  bed Baseline Vocal Quality: Normal    Oral/Motor/Sensory Function  Overall Oral Motor/Sensory Function: Within functional limits   Ice Chips Ice chips: Within functional limits Presentation: Spoon   Thin Liquid Thin Liquid: Impaired Presentation: Cup;Spoon;Straw Pharyngeal  Phase Impairments: Cough - Immediate(With straw sips)    Nectar Thick     Honey Thick     Puree Puree: Within functional limits Presentation: Self Fed;Spoon   Solid     Solid: Within functional limits Presentation: Self Fed     Dollene PrimroseSusan G Jules Baty, MS/CCC- SLP  Tedd SiasAbernathy, Susie 06/01/2018,10:35 AM

## 2018-06-01 NOTE — Progress Notes (Signed)
*  PRELIMINARY RESULTS* Echocardiogram 2D Echocardiogram has been performed. A Bubble Study (Saline Microcavitation) was requested and completed.  Garrel Ridgelikeshia S Andrell Tallman 06/01/2018, 5:36 PM

## 2018-06-01 NOTE — Consult Note (Signed)
Much more awake and near baseline. Following commands.      Past Medical History:  Diagnosis Date  . Hyperlipidemia   . Pre-diabetes     Past Surgical History:  Procedure Laterality Date  . HIP ARTHROPLASTY Right 09/29/2017   Procedure: ARTHROPLASTY BIPOLAR HIP (HEMIARTHROPLASTY);  Surgeon: Deeann Saint, MD;  Location: [facility] ORS;  Service: Orthopedics;  Laterality: Right;    Family History  Problem Relation Age of Onset  . CAD Father   . CAD Brother    Social History:  reports that he has never smoked. He has never used smokeless tobacco. He reports that he does not drink alcohol or use drugs.  Allergies:  Allergies  Allergen Reactions  . Sulfa Antibiotics     Per pt, he gets bad headaches with sulfa drugs    Medications:  I have reviewed the patient's current medications. Prior to Admission:  Medications Prior to Admission  Medication Sig Dispense Refill Last Dose  . acetaminophen (TYLENOL) 325 MG tablet Take 2 tablets (650 mg total) by mouth every 6 (six) hours as needed for mild pain (or Fever >/= 101). 15 tablet 0 PRN at PRN  . atorvastatin (LIPITOR) 40 MG tablet Take 40 mg by mouth daily.   Unknown at Unknown  . enalapril (VASOTEC) 2.5 MG tablet Take 2.5 mg by mouth daily.   Unknown at Unknown  . meloxicam (MOBIC) 15 MG tablet Take 15 mg by mouth daily.  3 PRN at PRN  . metFORMIN (GLUCOPHAGE-XR) 500 MG 24 hr tablet Take 500 mg by mouth daily.   Unknown at Unknown  . methocarbamol (ROBAXIN) 500 MG tablet Take 500 mg by mouth 2 (two) times daily.  1 PRN at PRN  . ferrous sulfate 325 (65 FE) MG tablet Take 1 tablet (325 mg total) by mouth 2 (two) times daily with a meal. (Patient not taking: Reported on 05/30/2018) 100 tablet 2 Not Taking at Unknown time  . pantoprazole (PROTONIX) 40 MG tablet Take 1 tablet (40 mg total) by mouth daily. (Patient not taking: Reported on 05/30/2018) 30 tablet 1 Not Taking at Unknown time   Scheduled: . amLODipine  10 mg Oral Daily  . aspirin   81 mg Oral Daily  . atorvastatin  80 mg Oral Daily  . clopidogrel  75 mg Oral Daily  . enalapril  2.5 mg Oral Daily  . enoxaparin (LOVENOX) injection  40 mg Subcutaneous Q24H  . insulin aspart  0-5 Units Subcutaneous QHS  . insulin aspart  0-9 Units Subcutaneous TID WC  . pantoprazole  40 mg Oral Daily    ROS: Unable to provide due to sedation  Physical Examination: Blood pressure (!) 156/75, pulse 77, temperature 98.1 F (36.7 C), temperature source Oral, resp. rate 14, height 5\' 11"  (1.803 m), weight 186 lb (84.4 kg), SpO2 97 %.    Neurological Examination   Mental Status: Alert, oriented, thought content appropriate.  Speech fluent without evidence of aphasia.  Able to follow 3 step commands without difficulty. Cranial Nerves: II: Discs flat bilaterally; Visual fields grossly normal, pupils equal, round, reactive to light and accommodation III,IV, VI: ptosis not present, extra-ocular motions intact bilaterally V,VII: smile symmetric, facial light touch sensation normal bilaterally VIII: hearing normal bilaterally IX,X: gag reflex present XI: bilateral shoulder shrug XII: midline tongue extension Motor: Right : Upper extremity   5/5    Left:     Upper extremity   5/5  Lower extremity   5/5     Lower extremity  5/5 Tone and bulk:normal tone throughout; no atrophy noted Sensory: Pinprick and light touch intact throughout, bilaterally Deep Tendon Reflexes: 2+ and symmetric throughout Plantars: Right: downgoing   Left: downgoing Cerebellar: normal finger-to-nose, normal rapid alternating movements and normal heel-to-shin test Gait: not tested        Laboratory Studies:  Basic Metabolic Panel: Recent Labs  Lab 05/30/18 1052 05/30/18 1727 05/31/18 1052  NA 142  --  140  K 3.9  --  4.0  CL 107  --  109  CO2 28  --  26  GLUCOSE 117*  --  134*  BUN 19  --  18  CREATININE 1.13 1.12 0.86  CALCIUM 9.2  --  9.0    Liver Function Tests: Recent Labs  Lab  05/30/18 1052  AST 24  ALT 18  ALKPHOS 82  BILITOT 0.9  PROT 6.4*  ALBUMIN 3.8   No results for input(s): LIPASE, AMYLASE in the last 168 hours. No results for input(s): AMMONIA in the last 168 hours.  CBC: Recent Labs  Lab 05/30/18 1052 05/30/18 1727  WBC 7.2 7.6  NEUTROABS 3.9  --   HGB 12.1* 12.4*  HCT 35.6* 36.1*  MCV 92.8 92.1  PLT 165 161    Cardiac Enzymes: Recent Labs  Lab 05/30/18 1052  TROPONINI <0.03    BNP: Invalid input(s): POCBNP  CBG: Recent Labs  Lab 05/31/18 1208 05/31/18 1639 05/31/18 2229 06/01/18 0809 06/01/18 1202  GLUCAP 128* 124* 104* 80 115*    Microbiology: Results for orders placed or performed during the hospital encounter of 09/28/17  Surgical PCR screen     Status: Abnormal   Collection Time: 09/28/17  5:43 PM  Result Value Ref Range Status   MRSA, PCR NEGATIVE NEGATIVE Final   Staphylococcus aureus POSITIVE (A) NEGATIVE Final    Comment: (NOTE) The Xpert SA Assay (FDA approved for NASAL specimens in patients 51 years of age and older), is one component of a comprehensive surveillance program. It is not intended to diagnose infection nor to guide or monitor treatment.   CULTURE, BLOOD (ROUTINE X 2) w Reflex to ID Panel     Status: None   Collection Time: 09/29/17  8:06 AM  Result Value Ref Range Status   Specimen Description BLOOD RIGHT ANTECUBITAL  Final   Special Requests   Final    BOTTLES DRAWN AEROBIC AND ANAEROBIC Blood Culture adequate volume   Culture NO GROWTH 5 DAYS  Final   Report Status 10/04/2017 FINAL  Final  CULTURE, BLOOD (ROUTINE X 2) w Reflex to ID Panel     Status: None   Collection Time: 09/29/17  8:13 AM  Result Value Ref Range Status   Specimen Description BLOOD RESISTANT HAND  Final   Special Requests   Final    BOTTLES DRAWN AEROBIC AND ANAEROBIC Blood Culture adequate volume   Culture NO GROWTH 5 DAYS  Final   Report Status 10/04/2017 FINAL  Final  Urine Culture     Status: None    Collection Time: 09/30/17  1:40 PM  Result Value Ref Range Status   Specimen Description URINE, RANDOM  Final   Special Requests NONE  Final   Culture   Final    NO GROWTH Performed at [facility], [address] [place], [place] [address]    Report Status 10/02/2017 FINAL  Final    Coagulation Studies: No results for input(s): LABPROT, INR in the last 72 hours.  Urinalysis:  Recent Labs  Lab 05/30/18 1052  COLORURINE YELLOW*  LABSPEC 1.018  PHURINE 5.0  GLUCOSEU NEGATIVE  HGBUR NEGATIVE  BILIRUBINUR NEGATIVE  KETONESUR 5*  PROTEINUR NEGATIVE  NITRITE NEGATIVE  LEUKOCYTESUR NEGATIVE    Lipid Panel:    Component Value Date/Time   CHOL 150 05/31/2018 0327   TRIG 100 05/31/2018 0327   HDL 39 (L) 05/31/2018 0327   CHOLHDL 3.8 05/31/2018 0327   VLDL 20 05/31/2018 0327   LDLCALC 91 05/31/2018 0327    HgbA1C:  Lab Results  Component Value Date   HGBA1C 5.3 05/31/2018    Urine Drug Screen:  No results found for: LABOPIA, COCAINSCRNUR, LABBENZ, AMPHETMU, THCU, LABBARB  Alcohol Level: No results for input(s): ETH in the last 168 hours.  Other results: EKG: sinus bradycardia at 57 bpm.  Imaging: US Carotid Bilateral (at [facility] Only)  Result Date: 05/31/2018 CLINICAL DATA:  Cerebral infarction and hyperlipidemia. EXAM: BILATERAL CAROTID DUPLEX ULTRASOUND TECHNIQUE: Wallace Cullens scale imaging, color Doppler and duplex ultrasound were performed of bilateral carotid and vertebral arteries in the neck. COMPARISON:  None. FINDINGS: Criteria: Quantification of carotid stenosis is based on velocity parameters that correlate the residual internal carotid diameter with NASCET-based stenosis levels, using the diameter of the distal internal carotid lumen as the denominator for stenosis measurement. The following velocity measurements were obtained: RIGHT ICA:  64/12 cm/sec CCA:  83/13 cm/sec SYSTOLIC ICA/CCA RATIO:  0.8 ECA:  151 cm/sec LEFT ICA:  72/18 cm/sec CCA:  74/15 cm/sec  SYSTOLIC ICA/CCA RATIO:  1.0 ECA:  100 cm/sec RIGHT CAROTID ARTERY: Common carotid artery is tortuous. There is a mild amount of noncalcified plaque at the level of the carotid bulb and extending to the ICA origin. No evidence of significant right ICA stenosis. Estimated right ICA stenosis is less than 50%. RIGHT VERTEBRAL ARTERY: Antegrade flow with normal waveform and velocity. LEFT CAROTID ARTERY: There is a mild amount of noncalcified plaque at the level of the carotid bulb and proximal left ICA. Estimated left ICA stenosis is less than 50%. LEFT VERTEBRAL ARTERY: Antegrade flow with normal waveform and velocity. IMPRESSION: Mild amount of plaque at the level of both carotid bulbs and proximal internal carotid arteries. Estimated bilateral ICA stenoses are less than 50%. Electronically Signed   By: Irish Lack M.D.   On: 05/31/2018 15:35   Mr Maxine Glenn Head/brain RU Cm  Result Date: 05/31/2018 CLINICAL DATA:  Mild headache, intermittent confusion for weeks. Follow-up LEFT cerebellar infarct. EXAM: MRA HEAD WITHOUT CONTRAST TECHNIQUE: Angiographic images of the Circle of Willis were obtained using MRA technique without intravenous contrast. COMPARISON:  MRI head May 30, 2018 FINDINGS: ANTERIOR CIRCULATION: Normal flow related enhancement of the included cervical, petrous, cavernous and supraclinoid internal carotid arteries. Patent anterior communicating artery. Patent anterior and middle cerebral arteries, symmetric mild MCA signal loss mid segments related to tortuous vessel inflow artifact. No large vessel occlusion, flow limiting stenosis, aneurysm. POSTERIOR CIRCULATION: Codominant vertebral arteries. Vertebrobasilar arteries are patent, with normal flow related enhancement of the main branch vessels. Patent posterior cerebral arteries. No large vessel occlusion, flow limiting stenosis,  aneurysm. ANATOMIC VARIANTS: None. Source images and MIP images were reviewed. IMPRESSION: 1. No emergent large vessel  occlusion or flow-limiting stenosis. Electronically Signed   By: Awilda Metro M.D.   On: 05/31/2018 02:24   Ct Head Code Stroke Wo Contrast  Addendum Date: 05/31/2018   ADDENDUM REPORT: 05/31/2018 05:21 ADDENDUM: Critical Value/emergent results were called by telephone at the time of interpretation on 05/31/2018 at  5:16 am to Dr. Barbaraann RondoPRASANNA SRIDHARAN , who verbally acknowledged these results. Electronically Signed   By: Awilda Metroourtnay  Bloomer M.D.   On: 05/31/2018 05:21   Result Date: 05/31/2018 CLINICAL DATA:  Code stroke.  Aphasia for an hour.  Recent stroke. EXAM: CT HEAD WITHOUT CONTRAST TECHNIQUE: Contiguous axial images were obtained from the base of the skull through the vertex without intravenous contrast. COMPARISON:  MRI of the head May 30, 2018 and CT HEAD May 30, 2018 FINDINGS: BRAIN: No intraparenchymal hemorrhage, mass effect nor midline shift. Moderate parenchymal brain volume loss. No hydrocephalus. Patchy supratentorial white matter hypodensities within normal range for patient's age, though non-specific are most compatible with chronic small vessel ischemic disease. No acute large vascular territory infarcts. No abnormal extra-axial fluid collections. Basal cisterns are patent. VASCULAR: Mild calcific atherosclerosis of the carotid siphons. SKULL: No skull fracture. No significant scalp soft tissue swelling. SINUSES/ORBITS: The mastoid air-cells and included paranasal sinuses are well-aerated.The included ocular globes and orbital contents are non-suspicious. Status post bilateral ocular lens implants. OTHER: None. ASPECTS [facility]([place] Stroke Program Early CT Score) - Ganglionic level infarction (caudate, lentiform nuclei, internal capsule, insula, M1-M3 cortex): 7 - Supraganglionic infarction (M4-M6 cortex): 3 Total score (0-10 with 10 being normal): 10 IMPRESSION: 1. No acute intracranial process. 2. Stable parenchymal brain volume loss and moderate chronic small vessel ischemic changes. 3.  ASPECTS is 10. Electronically Signed: By: Awilda Metroourtnay  Bloomer M.D. On: 05/31/2018 04:58    Assessment: 77 y.o. male presenting with episodic confusion, worsening for the past 6 weeks.  Has had some episodic focal symptoms and starring spells as well.  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  MRA unrenarkable.  Patient on Plavix prior to admission.   - con't anti platelet therapy - no need to repeat the MRI - pt is up and eating - likely transfer out of ICU today

## 2018-06-01 NOTE — Progress Notes (Signed)
Patient moved to room 120 by bed with Diannia RuderKara, NT.  Family in new room.  Patient has been A&O this afternoon and cooperative.  Patient moved to new room on tele with no distress noted.

## 2018-06-01 NOTE — Progress Notes (Signed)
OT Cancellation Note  Patient Details Name: Alexander MeadowRaymond E Pratt Jr. MRN: [id number] DOB: 01/24/1941   Cancelled Treatment:    Reason Eval/Treat Not Completed: Medical issues which prohibited therapy Per chart review, patient transferred to CCU due to change in status.  Will require new orders to resume OT services.  Please re-consult as medically appropriate.  Alexander BrockAlison Desyre Calma, MS, OTR/L ascom [phone] or [phone] 06/01/18, 8:28 AM

## 2018-06-01 NOTE — Progress Notes (Signed)
[employer] [facility] [place] [facility]   PATIENT NAME: Alexander Kline    MR#:  [id number]  DATE OF BIRTH:  01/04/1941  SUBJECTIVE:  CHIEF COMPLAINT:   Chief Complaint  Patient presents with  . Altered Mental Status   -Admitted with stroke.  Not on Precedex.  Occasional agitation but much improved.  More alert and communicating today  REVIEW OF SYSTEMS:  Review of Systems  Constitutional: Negative for chills and fever.  Respiratory: Negative for cough, shortness of breath and wheezing.   Cardiovascular: Negative for chest pain and palpitations.  Gastrointestinal: Negative for abdominal pain, constipation, diarrhea, nausea and vomiting.  Genitourinary: Negative for dysuria.  Neurological: Positive for focal weakness and weakness. Negative for dizziness, seizures and headaches.  Psychiatric/Behavioral:       Confusion    DRUG ALLERGIES:   Allergies  Allergen Reactions  . Sulfa Antibiotics     Per pt, he gets bad headaches with sulfa drugs    VITALS:  Blood pressure (!) 153/90, pulse 80, temperature 98.1 F (36.7 C), temperature source Oral, resp. rate 20, height 5\' 11"  (1.803 m), weight 84.4 kg (186 lb), SpO2 96 %.  PHYSICAL EXAMINATION:  Physical Exam  GENERAL:  77 y.o.-year-old patient lying in the bed, sedated EYES: Pupils equal, round, reactive to light and accommodation. No scleral icterus.  Extraocular movements are intact HEENT: Head atraumatic, normocephalic. Oropharynx and nasopharynx clear.  NECK:  Supple, no jugular venous distention. No thyroid enlargement, no tenderness.  LUNGS: Normal breath sounds bilaterally, no wheezing, rales,rhonchi or crepitation. No use of accessory muscles of respiration. Decreased bibasilar breath sounds CARDIOVASCULAR: S1, S2 normal. No  rubs, or gallops. 2/6 systolic murmur present ABDOMEN: Soft, nontender, nondistended. Bowel sounds present. No organomegaly or mass.  EXTREMITIES: No pedal edema, cyanosis, or  clubbing.  NEUROLOGIC: Cranial nerves II through XII are intact. No facial droop.  Some confusion present.  Minimal left-sided weakness with strength of 4/5 noted in left upper and lower extremity as well as right side is 5/5.  Sensation is intact.  Gait is not checked. PSYCHIATRIC: The patient is alert and oriented x1-2.  Intermittent confusion noted.  SKIN: No obvious rash, lesion, or ulcer.    LABORATORY PANEL:   CBC Recent Labs  Lab 05/30/18 1727  WBC 7.6  HGB 12.4*  HCT 36.1*  PLT 161   ------------------------------------------------------------------------------------------------------------------  Chemistries  Recent Labs  Lab 05/30/18 1052  05/31/18 1052  NA 142  --  140  K 3.9  --  4.0  CL 107  --  109  CO2 28  --  26  GLUCOSE 117*  --  134*  BUN 19  --  18  CREATININE 1.13   < > 0.86  CALCIUM 9.2  --  9.0  AST 24  --   --   ALT 18  --   --   ALKPHOS 82  --   --   BILITOT 0.9  --   --    < > = values in this interval not displayed.   ------------------------------------------------------------------------------------------------------------------  Cardiac Enzymes Recent Labs  Lab 05/30/18 1052  TROPONINI <0.03   ------------------------------------------------------------------------------------------------------------------  RADIOLOGY:  Mr Brain Wo Contrast  Result Date: 05/30/2018 CLINICAL DATA:  77 y/o M; 1 week of increased confusion and mild headache yesterday. EXAM: MRI HEAD WITHOUT CONTRAST TECHNIQUE: Multiplanar, multiecho pulse sequences of the brain and surrounding structures were obtained without intravenous contrast. COMPARISON:  05/30/2018 CT head. FINDINGS: Brain: Punctate focus of diffusion  hyperintensity within the left medial cerebellum without definite reduced ADC, possible acute or subacute microvascular infarction. No associated hemorrhage or mass effect. No abnormal susceptibility hypointensity to indicate intracranial hemorrhage. Few  nonspecific foci of T2 FLAIR hyperintense signal abnormality predominantly within periventricular white matter is compatible with mild chronic microvascular ischemic changes. Moderate volume loss of the brain for age. No focal mass effect, extra-axial collection, hydrocephalus, or herniation. Vascular: Normal flow voids. Skull and upper cervical spine: Normal marrow signal. Sinuses/Orbits: Negative.  Bilateral intra-ocular lens replacement. Other: None. IMPRESSION: 1. Possible punctate acute or subacute microvascular infarction within the left medial cerebellum. 2. Mild chronic microvascular ischemic changes and moderate volume loss of the brain for age. Electronically Signed   By: Mitzi Hansen M.D.   On: 05/30/2018 14:15   US Carotid Bilateral (at [facility] Only)  Result Date: 05/31/2018 CLINICAL DATA:  Cerebral infarction and hyperlipidemia. EXAM: BILATERAL CAROTID DUPLEX ULTRASOUND TECHNIQUE: Wallace Cullens scale imaging, color Doppler and duplex ultrasound were performed of bilateral carotid and vertebral arteries in the neck. COMPARISON:  None. FINDINGS: Criteria: Quantification of carotid stenosis is based on velocity parameters that correlate the residual internal carotid diameter with NASCET-based stenosis levels, using the diameter of the distal internal carotid lumen as the denominator for stenosis measurement. The following velocity measurements were obtained: RIGHT ICA:  64/12 cm/sec CCA:  83/13 cm/sec SYSTOLIC ICA/CCA RATIO:  0.8 ECA:  151 cm/sec LEFT ICA:  72/18 cm/sec CCA:  74/15 cm/sec SYSTOLIC ICA/CCA RATIO:  1.0 ECA:  100 cm/sec RIGHT CAROTID ARTERY: Common carotid artery is tortuous. There is a mild amount of noncalcified plaque at the level of the carotid bulb and extending to the ICA origin. No evidence of significant right ICA stenosis. Estimated right ICA stenosis is less than 50%. RIGHT VERTEBRAL ARTERY: Antegrade flow with normal waveform and velocity. LEFT CAROTID ARTERY: There is a  mild amount of noncalcified plaque at the level of the carotid bulb and proximal left ICA. Estimated left ICA stenosis is less than 50%. LEFT VERTEBRAL ARTERY: Antegrade flow with normal waveform and velocity. IMPRESSION: Mild amount of plaque at the level of both carotid bulbs and proximal internal carotid arteries. Estimated bilateral ICA stenoses are less than 50%. Electronically Signed   By: Irish Lack M.D.   On: 05/31/2018 15:35   Mr Maxine Glenn Head/brain ZO Cm  Result Date: 05/31/2018 CLINICAL DATA:  Mild headache, intermittent confusion for weeks. Follow-up LEFT cerebellar infarct. EXAM: MRA HEAD WITHOUT CONTRAST TECHNIQUE: Angiographic images of the Circle of Willis were obtained using MRA technique without intravenous contrast. COMPARISON:  MRI head May 30, 2018 FINDINGS: ANTERIOR CIRCULATION: Normal flow related enhancement of the included cervical, petrous, cavernous and supraclinoid internal carotid arteries. Patent anterior communicating artery. Patent anterior and middle cerebral arteries, symmetric mild MCA signal loss mid segments related to tortuous vessel inflow artifact. No large vessel occlusion, flow limiting stenosis, aneurysm. POSTERIOR CIRCULATION: Codominant vertebral arteries. Vertebrobasilar arteries are patent, with normal flow related enhancement of the main branch vessels. Patent posterior cerebral arteries. No large vessel occlusion, flow limiting stenosis,  aneurysm. ANATOMIC VARIANTS: None. Source images and MIP images were reviewed. IMPRESSION: 1. No emergent large vessel occlusion or flow-limiting stenosis. Electronically Signed   By: Awilda Metro M.D.   On: 05/31/2018 02:24   Ct Head Code Stroke Wo Contrast  Addendum Date: 05/31/2018   ADDENDUM REPORT: 05/31/2018 05:21 ADDENDUM: Critical Value/emergent results were called by telephone at the time of interpretation on 05/31/2018 at 5:16 am to  Dr. Barbaraann Rondo , who verbally acknowledged these results. Electronically  Signed   By: Awilda Metro M.D.   On: 05/31/2018 05:21   Result Date: 05/31/2018 CLINICAL DATA:  Code stroke.  Aphasia for an hour.  Recent stroke. EXAM: CT HEAD WITHOUT CONTRAST TECHNIQUE: Contiguous axial images were obtained from the base of the skull through the vertex without intravenous contrast. COMPARISON:  MRI of the head May 30, 2018 and CT HEAD May 30, 2018 FINDINGS: BRAIN: No intraparenchymal hemorrhage, mass effect nor midline shift. Moderate parenchymal brain volume loss. No hydrocephalus. Patchy supratentorial white matter hypodensities within normal range for patient's age, though non-specific are most compatible with chronic small vessel ischemic disease. No acute large vascular territory infarcts. No abnormal extra-axial fluid collections. Basal cisterns are patent. VASCULAR: Mild calcific atherosclerosis of the carotid siphons. SKULL: No skull fracture. No significant scalp soft tissue swelling. SINUSES/ORBITS: The mastoid air-cells and included paranasal sinuses are well-aerated.The included ocular globes and orbital contents are non-suspicious. Status post bilateral ocular lens implants. OTHER: None. ASPECTS [facility] Stroke Program Early CT Score) - Ganglionic level infarction (caudate, lentiform nuclei, internal capsule, insula, M1-M3 cortex): 7 - Supraganglionic infarction (M4-M6 cortex): 3 Total score (0-10 with 10 being normal): 10 IMPRESSION: 1. No acute intracranial process. 2. Stable parenchymal brain volume loss and moderate chronic small vessel ischemic changes. 3. ASPECTS is 10. Electronically Signed: By: Awilda Metro M.D. On: 05/31/2018 04:58    EKG:   Orders placed or performed during the hospital encounter of 05/30/18  . ED EKG  . ED EKG  . EKG 12-Lead  . EKG 12-Lead  . EKG 12-Lead  . EKG 12-Lead    ASSESSMENT AND PLAN:   77y/o M with PMH significant for Hyperlipidemia, paroxysmal afib not on any anti coagulation, NIDDM brought by daughter secondary to  worsening confusion.  1. Acute stroke- MRI brain on adm with punctate left cerebellar infarct - also has moderate volume loss of brain with chronic microvascular ischemic changes -Repeat MRI was ordered yesterday  due to code stroke due to behavioral changes but unable to be done due to him being on precedex infusion-we will leave it up to neurologist whether it needs to be done or a plain follow-up CT can be ordered MRA with no large vessel occlusion findings -Currently on Plavix.  Aspirin has been added.  Also on statin.  - Might be a candidate for anticoagulation with NOVACs due to paroxysmal A. Fib -Neurology consult appreciated.  Continue neurochecks.  Carotid Dopplers and echo -PT/OT and speech consults  2.  Paroxysmal A. fib-according to daughter, patient was told that he had a regular rhythm previously.  There is no documentation of any diagnosis of A. fib on the chart. -Noted to be in A. fib yesterday morning.  Currently irregular rhythm but sinus arrhythmia noted due to P wave being present -Rate controlled.   add beta-blocker if HR is stable -Recommend anticoagulation after neurology evaluation -Has a cardiologist as outpatient- Dr. Milta Deiters  3.  Acute delirium-due to stroke and also worsen possibly by underlying dementia -EEG done - improving  4.  Diabetes mellitus-on sliding scale insulin a1c only 5.3  5.  DVT prophylaxis-Lovenox  Updated daughter at bedside PT/OT consults today    All the records are reviewed and case discussed with Care Management/Social Workerr. Management plans discussed with the patient, family and they are in agreement.  CODE STATUS: Full Code  TOTAL TIME TAKING CARE OF THIS PATIENT: 36 minutes.  POSSIBLE D/C IN  2-3  DAYS, DEPENDING ON CLINICAL CONDITION.   Marnell Mcdaniel M.D on 06/01/2018 at 12:01 PM  Between 7am to 6pm - Pager - [phone]  After 6pm go to [email or web address] - password [email or web address]  [facility]  Office   [phone]  CC: Primary care physician; Sherron Monday, MD

## 2018-06-01 NOTE — Progress Notes (Signed)
Report called to Marchelle FolksAmanda, RN on 1C.  Patient will be moved to new room after echocardiogram is completed.

## 2018-06-01 NOTE — Progress Notes (Signed)
Dr. Loretha BrasilZeylikman called and gave order to discontinue MRI.

## 2018-06-02 LAB — BASIC METABOLIC PANEL
ANION GAP: 7 (ref 5–15)
BUN: 15 mg/dL (ref 8–23)
CALCIUM: 8.8 mg/dL — AB (ref 8.9–10.3)
CO2: 23 mmol/L (ref 22–32)
Chloride: 113 mmol/L — ABNORMAL HIGH (ref 98–111)
Creatinine, Ser: 1.01 mg/dL (ref 0.61–1.24)
GFR calc Af Amer: >60 mL/min (ref >60)
GLUCOSE: 108 mg/dL — AB (ref 70–99)
POTASSIUM: 3.3 mmol/L — AB (ref 3.5–5.1)
SODIUM: 143 mmol/L (ref 135–145)

## 2018-06-02 LAB — IRON AND TIBC
Iron: 73 ug/dL (ref 45–182)
SATURATION RATIOS: 27 % (ref 17.9–39.5)
TIBC: 271 ug/dL (ref 250–450)
UIBC: 199 ug/dL

## 2018-06-02 LAB — GLUCOSE, CAPILLARY
GLUCOSE-CAPILLARY: 99 mg/dL (ref 70–99)
GLUCOSE-CAPILLARY: 89 mg/dL (ref 70–99)
GLUCOSE-CAPILLARY: 91 mg/dL (ref 70–99)
Glucose-Capillary: 103 mg/dL — ABNORMAL HIGH (ref 70–99)

## 2018-06-02 LAB — URINALYSIS, COMPLETE (UACMP) WITH MICROSCOPIC
BACTERIA UA: NONE SEEN
BILIRUBIN URINE: NEGATIVE
Glucose, UA: NEGATIVE mg/dL
Hgb urine dipstick: NEGATIVE
KETONES UR: 5 mg/dL — AB
LEUKOCYTES UA: NEGATIVE
NITRITE: NEGATIVE
Protein, ur: NEGATIVE mg/dL
SPECIFIC GRAVITY, URINE: 1.006 (ref 1.005–1.030)
SQUAMOUS EPITHELIAL / LPF: NONE SEEN (ref 0–5)
WBC UA: NONE SEEN WBC/hpf (ref 0–5)
pH: 7 (ref 5.0–8.0)

## 2018-06-02 MED ORDER — KETOROLAC TROMETHAMINE 30 MG/ML IJ SOLN
15.0000 mg | Freq: Once | INTRAMUSCULAR | Status: AC
  Administered 2018-06-02: 15 mg via INTRAVENOUS
  Filled 2018-06-02: qty 1

## 2018-06-02 MED ORDER — ENSURE ENLIVE PO LIQD
237.0000 mL | Freq: Two times a day (BID) | ORAL | Status: DC
  Administered 2018-06-03 (×2): 237 mL via ORAL

## 2018-06-02 NOTE — Evaluation (Signed)
Physical Therapy Evaluation Patient Details Name: Alexander Kline. MRN: [id number] DOB: 07-16-1941 Today's Date: 06/02/2018   History of Present Illness  77 y/o male here with subacute microvascular infarction.  He apparently has been having some variable confusion/unsteadiness for the last month+, but over the last few days had been considerably worse, unsafe.  Pt had R total hip replacement 8 months ago.  Clinical Impression  Pt with general confusion and overall poor awareness t/o the session, but did relatively well physically despite needing a lot of cuing and guarding.  He needed light assist to get to sitting at EOB and struggled to maintain upright in sitting despite assist to get positioned properly (falling back and to the R).  He was able to rise w/o direct assist and managed to ambulation around the nurses station with heavy reliance on the walker, some fatigue but no overt LOBs though he did need constant guidance and cuing secondary to poor awareness/confusion.  Pt not at all near his baseline and will clearly require consistent supervision.     Follow Up Recommendations Supervision/Assistance - 24 hour;Home health PT(will need significantly increased supervision, far from PLOF)    Equipment Recommendations  None recommended by PT    Recommendations for Other Services       Precautions / Restrictions Precautions Precautions: Fall Restrictions Weight Bearing Restrictions: No      Mobility  Bed Mobility Overal bed mobility: Needs Assistance Bed Mobility: Supine to Sit     Supine to sit: Min assist     General bed mobility comments: Pt was able initiate getting to EOB, able to get feet off bed but ultimately was unable to fully shift to sitting despite cuing, light assist to get to EOB.  Struggled to consistently maintain sitting balance leaning back and to the R despite cuing/assist  Transfers Overall transfer level: Modified independent Equipment used: Rolling  walker (2 wheeled)             General transfer comment: Cues for UEs and set up, but pt was able to rise to standing with UE use of rails  Ambulation/Gait Ambulation/Gait assistance: Min guard Gait Distance (Feet): 200 Feet Assistive device: Rolling walker (2 wheeled)       General Gait Details: Pt was surprisingly able to push through a prolonged bout of ambulation.  He had inconsistent cadence with R LE typically trailing behind, heavy reliance on the walker and over all needed close guidance secondary to confusion and poor awareness.  Stairs            Wheelchair Mobility    Modified Rankin (Stroke Patients Only)       Balance Overall balance assessment: Needs assistance Sitting-balance support: Bilateral upper extremity supported Sitting balance-Leahy Scale: Poor Sitting balance - Comments: Pt leaning back and to the R consistently in sitting, only able to maintain upright w/o assist briefly here and there   Standing balance support: Bilateral upper extremity supported Standing balance-Leahy Scale: Fair Standing balance comment: highly reliant on the walker to stay upright, but no overt LOBs                              Pertinent Vitals/Pain Pain Assessment: No/denies pain    Home Living Family/patient expects to be discharged to:: Private residence Living Arrangements: Alone Available Help at Discharge: Family;Available PRN/intermittently;Available 24 hours/day(Pt typically with wife/family ~4 hrs about dinner time)   Home Access: Level entry  Home Layout: Two level;Able to live on main level with bedroom/bathroom(split level home) Home Equipment: Walker - 2 wheels;Cane - single point      Prior Function Level of Independence: Independent         Comments: Pt has had 4 falls this year but had recovered from hip replacement in December to driving, getting around w/o AD, etc     Hand Dominance        Extremity/Trunk Assessment    Upper Extremity Assessment Upper Extremity Assessment: Overall WFL for tasks assessed(decreased b/l hand coordination L>R)    Lower Extremity Assessment Lower Extremity Assessment: Overall WFL for tasks assessed(functional strength, L leg extension weak (~3+/5) than R)       Communication   Communication: No difficulties  Cognition Arousal/Alertness: Awake/alert Behavior During Therapy: WFL for tasks assessed/performed Overall Cognitive Status: Impaired/Different from baseline Area of Impairment: Awareness;Safety/judgement                               General Comments: Pt with multiple episodes of not knowing situation, unable to follow simple commands or attempting unsafe mobility, etc when expressly instructed not to       General Comments      Exercises     Assessment/Plan    PT Assessment Patient needs continued PT services  PT Problem List         PT Treatment Interventions DME instruction;Gait training;Stair training;Functional mobility training;Therapeutic activities;Therapeutic exercise;Balance training;Cognitive remediation;Patient/family education;Neuromuscular re-education    PT Goals (Current goals can be found in the Care Plan section)  Acute Rehab PT Goals Patient Stated Goal: try to go home PT Goal Formulation: With patient/family Time For Goal Achievement: 06/16/18 Potential to Achieve Goals: Fair    Frequency 7X/week   Barriers to discharge        Co-evaluation               AM-PAC PT "6 Clicks" Daily Activity  Outcome Measure Difficulty turning over in bed (including adjusting bedclothes, sheets and blankets)?: A Little Difficulty moving from lying on back to sitting on the side of the bed? : Unable Difficulty sitting down on and standing up from a chair with arms (e.g., wheelchair, bedside commode, etc,.)?: A Little Help needed moving to and from a bed to chair (including a wheelchair)?: A Little Help needed walking in  hospital room?: A Lot Help needed climbing 3-5 steps with a railing? : A Lot 6 Click Score: 14    End of Session Equipment Utilized During Treatment: Gait belt Activity Tolerance: Patient limited by fatigue Patient left: with chair alarm set;with call Bomberger/phone within reach;with family/visitor present Nurse Communication: Mobility status PT Visit Diagnosis: Difficulty in walking, not elsewhere classified (R26.2);History of falling (Z91.81);Muscle weakness (generalized) (M62.81);Other symptoms and signs involving the nervous system (R29.898)    Time: [id number] PT Time Calculation (min) (ACUTE ONLY): 45 min   Charges:   PT Evaluation $PT Eval Low Complexity: 1 Low PT Treatments $Gait Training: 8-22 mins        Malachi ProGalen R Devanee Pomplun, DPT 06/02/2018, 3:31 PM

## 2018-06-02 NOTE — Plan of Care (Signed)
  Problem: Education: Goal: Knowledge of disease or condition will improve Outcome: Progressing Goal: Knowledge of secondary prevention will improve Outcome: Progressing Goal: Knowledge of patient specific risk factors addressed and post discharge goals established will improve Outcome: Progressing Goal: Individualized Educational Video(s) Outcome: Progressing   Problem: Coping: Goal: Will verbalize positive feelings about self Outcome: Progressing Goal: Will identify appropriate support needs Outcome: Progressing   Problem: Self-Care: Goal: Ability to participate in self-care as condition permits will improve Outcome: Progressing Goal: Verbalization of feelings and concerns over difficulty with self-care will improve Outcome: Progressing Goal: Ability to communicate needs accurately will improve Outcome: Progressing   Problem: Nutrition: Goal: Risk of aspiration will decrease Outcome: Progressing Goal: Dietary intake will improve Outcome: Progressing   Problem: Ischemic Stroke/TIA Tissue Perfusion: Goal: Complications of ischemic stroke/TIA will be minimized Outcome: Progressing   Problem: Spontaneous Subarachnoid Hemorrhage Tissue Perfusion: Goal: Complications of Spontaneous Subarachnoid Hemorrhage will be minimized Outcome: Progressing   Problem: Education: Goal: Knowledge of General Education information will improve Description Including pain rating scale, medication(s)/side effects and non-pharmacologic comfort measures Outcome: Progressing   Problem: Health Behavior/Discharge Planning: Goal: Ability to manage health-related needs will improve Outcome: Progressing   Problem: Clinical Measurements: Goal: Ability to maintain clinical measurements within normal limits will improve Outcome: Progressing Goal: Will remain free from infection Outcome: Progressing Goal: Diagnostic test results will improve Outcome: Progressing Goal: Respiratory complications will  improve Outcome: Progressing Goal: Cardiovascular complication will be avoided Outcome: Progressing   Problem: Activity: Goal: Risk for activity intolerance will decrease Outcome: Progressing   Problem: Nutrition: Goal: Adequate nutrition will be maintained Outcome: Progressing   Problem: Coping: Goal: Level of anxiety will decrease Outcome: Progressing   Problem: Elimination: Goal: Will not experience complications related to bowel motility Outcome: Progressing Goal: Will not experience complications related to urinary retention Outcome: Progressing   Problem: Pain Managment: Goal: General experience of comfort will improve Outcome: Progressing   Problem: Safety: Goal: Ability to remain free from injury will improve Outcome: Progressing   Problem: Skin Integrity: Goal: Risk for impaired skin integrity will decrease Outcome: Progressing

## 2018-06-02 NOTE — Progress Notes (Signed)
[facility] - [place] at [facility]   PATIENT NAME: Alexander Kline    MR#:  [id number]  DATE OF BIRTH:  Aug 17, 1941  SUBJECTIVE:  CHIEF COMPLAINT:   Chief Complaint  Patient presents with  . Altered Mental Status   -family at bedside.  Patient confused again last night. -Concern for sundowning.  Physical therapy consult today  REVIEW OF SYSTEMS:  Review of Systems  Constitutional: Negative for chills and fever.  Respiratory: Negative for cough, shortness of breath and wheezing.   Cardiovascular: Negative for chest pain and palpitations.  Gastrointestinal: Negative for abdominal pain, constipation, diarrhea, nausea and vomiting.  Genitourinary: Negative for dysuria.  Neurological: Positive for focal weakness and weakness. Negative for dizziness, seizures and headaches.  Psychiatric/Behavioral:       Confusion    DRUG ALLERGIES:   Allergies  Allergen Reactions  . Sulfa Antibiotics     Per pt, he gets bad headaches with sulfa drugs    VITALS:  Blood pressure (!) 162/71, pulse (!) 112, temperature 98.6 F (37 C), temperature source Oral, resp. rate 20, height 5\' 11"  (1.803 m), weight 85.2 kg (187 lb 13.3 oz), SpO2 97 %.  PHYSICAL EXAMINATION:  Physical Exam  GENERAL:  77 y.o.-year-old patient lying in the bed, sedated EYES: Pupils equal, round, reactive to light and accommodation. No scleral icterus.  Extraocular movements are intact HEENT: Head atraumatic, normocephalic. Oropharynx and nasopharynx clear.  NECK:  Supple, no jugular venous distention. No thyroid enlargement, no tenderness.  LUNGS: Normal breath sounds bilaterally, no wheezing, rales,rhonchi or crepitation. No use of accessory muscles of respiration. Decreased bibasilar breath sounds CARDIOVASCULAR: S1, S2 normal. No  rubs, or gallops. 2/6 systolic murmur present ABDOMEN: Soft, nontender, nondistended. Bowel sounds present. No organomegaly or mass.  EXTREMITIES: No pedal edema, cyanosis, or  clubbing.  NEUROLOGIC: Cranial nerves II through XII are intact. No facial droop.  Some confusion present.  Equal strength all extremities,.  Sensation is intact.  Gait is not checked. PSYCHIATRIC: The patient is alert and oriented x1-2.  Intermittent confusion noted.  SKIN: No obvious rash, lesion, or ulcer.    LABORATORY PANEL:   CBC Recent Labs  Lab 05/30/18 1727  WBC 7.6  HGB 12.4*  HCT 36.1*  PLT 161   ------------------------------------------------------------------------------------------------------------------  Chemistries  Recent Labs  Lab 05/30/18 1052  06/02/18 0416  NA 142   < > 143  K 3.9   < > 3.3*  CL 107   < > 113*  CO2 28   < > 23  GLUCOSE 117*   < > 108*  BUN 19   < > 15  CREATININE 1.13   < > 1.01  CALCIUM 9.2   < > 8.8*  AST 24  --   --   ALT 18  --   --   ALKPHOS 82  --   --   BILITOT 0.9  --   --    < > = values in this interval not displayed.   ------------------------------------------------------------------------------------------------------------------  Cardiac Enzymes Recent Labs  Lab 05/30/18 1052  TROPONINI <0.03   ------------------------------------------------------------------------------------------------------------------  RADIOLOGY:  US Carotid Bilateral (at [facility] Only)  Result Date: 05/31/2018 CLINICAL DATA:  Cerebral infarction and hyperlipidemia. EXAM: BILATERAL CAROTID DUPLEX ULTRASOUND TECHNIQUE: Wallace Cullens scale imaging, color Doppler and duplex ultrasound were performed of bilateral carotid and vertebral arteries in the neck. COMPARISON:  None. FINDINGS: Criteria: Quantification of carotid stenosis is based on velocity parameters that correlate the residual internal carotid  diameter with NASCET-based stenosis levels, using the diameter of the distal internal carotid lumen as the denominator for stenosis measurement. The following velocity measurements were obtained: RIGHT ICA:  64/12 cm/sec CCA:  83/13 cm/sec SYSTOLIC  ICA/CCA RATIO:  0.8 ECA:  151 cm/sec LEFT ICA:  72/18 cm/sec CCA:  74/15 cm/sec SYSTOLIC ICA/CCA RATIO:  1.0 ECA:  100 cm/sec RIGHT CAROTID ARTERY: Common carotid artery is tortuous. There is a mild amount of noncalcified plaque at the level of the carotid bulb and extending to the ICA origin. No evidence of significant right ICA stenosis. Estimated right ICA stenosis is less than 50%. RIGHT VERTEBRAL ARTERY: Antegrade flow with normal waveform and velocity. LEFT CAROTID ARTERY: There is a mild amount of noncalcified plaque at the level of the carotid bulb and proximal left ICA. Estimated left ICA stenosis is less than 50%. LEFT VERTEBRAL ARTERY: Antegrade flow with normal waveform and velocity. IMPRESSION: Mild amount of plaque at the level of both carotid bulbs and proximal internal carotid arteries. Estimated bilateral ICA stenoses are less than 50%. Electronically Signed   By: Irish LackGlenn  Yamagata M.D.   On: 05/31/2018 15:35    EKG:   Orders placed or performed during the hospital encounter of 05/30/18  . ED EKG  . ED EKG  . EKG 12-Lead  . EKG 12-Lead  . EKG 12-Lead  . EKG 12-Lead    ASSESSMENT AND PLAN:   77y/o M with PMH significant for Hyperlipidemia, paroxysmal afib not on any anti coagulation, NIDDM brought by daughter secondary to worsening confusion.  1. Acute stroke- MRI brain on adm with punctate left cerebellar infarct - also has moderate volume loss of brain with chronic microvascular ischemic changes MRA with no large vessel occlusion findings -Currently on Plavix.  Aspirin has been added.  Also on statin.  -Neurology consult appreciated.  Continue neurochecks.  Carotid Dopplers with no hemodynamically significant stenosis and echo is pending -PT/OT and speech consults today -EKGs with no A. fib, sinus arrhythmia noted  2.  Acute delirium-concern for underlying dementia. -EEG done -Noted.  Continue to monitor during the day, Zyprexa at bedtime if needed  3.  Hypokalemia-being  replaced  4.  Diabetes mellitus-on sliding scale insulin a1c only 5.3  5.  DVT prophylaxis-Lovenox  6.  Hypertension-on Norvasc and low-dose enalapril  Updated daughter at bedside PT/OT consults    All the records are reviewed and case discussed with Care Management/Social Workerr. Management plans discussed with the patient, family and they are in agreement.  CODE STATUS: Full Code  TOTAL TIME TAKING CARE OF THIS PATIENT: 35 minutes.   POSSIBLE D/C IN  2-3  DAYS, DEPENDING ON CLINICAL CONDITION.   Enid BaasKALISETTI,Roderic Lammert M.D on 06/02/2018 at 11:44 AM  Between 7am to 6pm - Pager - [phone]  After 6pm go to [email or web address] - password [email or web address]  [facility]  Office  [phone]  CC: Primary care physician; Sherron Mondayejan-Sie, S Ahmed, MD

## 2018-06-03 LAB — BASIC METABOLIC PANEL
Anion gap: 7 (ref 5–15)
BUN: 14 mg/dL (ref 8–23)
CALCIUM: 8.6 mg/dL — AB (ref 8.9–10.3)
CO2: 24 mmol/L (ref 22–32)
Chloride: 112 mmol/L — ABNORMAL HIGH (ref 98–111)
Creatinine, Ser: 0.89 mg/dL (ref 0.61–1.24)
GFR calc Af Amer: >60 mL/min (ref >60)
GFR calc non Af Amer: >60 mL/min (ref >60)
GLUCOSE: 107 mg/dL — AB (ref 70–99)
POTASSIUM: 3.3 mmol/L — AB (ref 3.5–5.1)
SODIUM: 143 mmol/L (ref 135–145)

## 2018-06-03 LAB — GLUCOSE, CAPILLARY
GLUCOSE-CAPILLARY: 125 mg/dL — AB (ref 70–99)
Glucose-Capillary: 105 mg/dL — ABNORMAL HIGH (ref 70–99)
Glucose-Capillary: 124 mg/dL — ABNORMAL HIGH (ref 70–99)
Glucose-Capillary: 144 mg/dL — ABNORMAL HIGH (ref 70–99)
Glucose-Capillary: 117 mg/dL — ABNORMAL HIGH (ref 70–99)

## 2018-06-03 MED ORDER — POTASSIUM CHLORIDE CRYS ER 20 MEQ PO TBCR
40.0000 meq | EXTENDED_RELEASE_TABLET | Freq: Once | ORAL | Status: AC
Start: 2018-06-03 — End: 2018-06-03
  Administered 2018-06-03: 40 meq via ORAL
  Filled 2018-06-03: qty 2

## 2018-06-03 NOTE — Progress Notes (Signed)
Physical Therapy Treatment Patient Details Name: Alexander MeadowRaymond E Shreiner Jr. MRN: [id number] DOB: 02/15/1941 Today's Date: 06/03/2018    History of Present Illness 77 y/o male here with subacute microvascular infarction.  He apparently has been having some variable confusion/unsteadiness for the last month+, but over the last few days had been considerably worse, unsafe.  Pt had R total hip replacement 8 months ago.    PT Comments    Patient demonstrated progress today with bed mobility, STS transfer and activity tolerance.  He was alert to self today and became intermittently restless throughout the treatment.  Patient reported no HA or pain otherwise and no visual deficits.  He was able to move to bedside without physical assist and sit at EOB without support from UE.  Pt required multiple attempts to stand from bedside but was able to complete with only VC's for hand placement.  He completed 150 ft of ambulation with gait and posture deviations indicative of fall risk.  Pt also demonstrated some confusion when PT offered commands for higher level gait activity.  Pt demonstrated ability to reach outside BOS overhead during static standing but experienced a posterior LOB when attempting reaching at a lower level.  PT assisted pt in correcting LOB, Min A.  Pt will continue to benefit from skilled PT with focus on balance and fall prevention, tolerance to activity, strength and safe use of AD.   Follow Up Recommendations  Home health PT;Supervision/Assistance - 24 hour     Equipment Recommendations  None recommended by PT    Recommendations for Other Services       Precautions / Restrictions Precautions Precautions: Fall Restrictions Weight Bearing Restrictions: No    Mobility  Bed Mobility Overal bed mobility: Modified Independent Bed Mobility: Supine to Sit     Supine to sit: Modified independent (Device/Increase time)     General bed mobility comments: Able to sit up today with use of bed  rail.  Transfers Overall transfer level: Needs assistance Equipment used: Rolling walker (2 wheeled) Transfers: Sit to/from Stand Sit to Stand: Min guard         General transfer comment: VC for hand placement for safe transfer.  Pt was able to rise from bedside without physical assist but required increased time and 2-3 attempts with wide BOS.  Ambulation/Gait Ambulation/Gait assistance: Min guard Gait Distance (Feet): 150 Feet Assistive device: Rolling walker (2 wheeled)     Gait velocity interpretation: 1.31 - 2.62 ft/sec, indicative of limited community ambulator General Gait Details: Narrow BOS, low to moderate foot clearance, need for VC's for use of RW and sequencing.  PT also noted R lateral lean and slight trendelenberg.  Pt's daughter responded that pt's gait has changed in the "past few months" following hip replacement.   Stairs             Wheelchair Mobility    Modified Rankin (Stroke Patients Only)       Balance Overall balance assessment: Needs assistance Sitting-balance support: Feet supported Sitting balance-Leahy Scale: Fair Sitting balance - Comments: Able to sit upright at bedside today with support from feet only.   Standing balance support: Bilateral upper extremity supported Standing balance-Leahy Scale: Fair Standing balance comment: Relies on RW for balance, experienced posterior LOB, min A to correct, with standing reach outside of BOS at hip level.  Able to reach outside BOS above shoulder level without LOB.             High level balance activites: Backward  walking;Turns;Side stepping High Level Balance Comments: Able to perform with use of RW and slowed gait.  Pt unable to follow commands consistently for higher level dynamic gait activity.            Cognition Arousal/Alertness: Awake/alert Behavior During Therapy: WFL for tasks assessed/performed Overall Cognitive Status: Impaired/Different from baseline Area of  Impairment: Awareness;Safety/judgement                         Safety/Judgement: Decreased awareness of safety     General Comments: Presents with intermittent confusion and slightly restless.  Follows commands with occasional inconsistency.  Alert to self.      Exercises      General Comments        Pertinent Vitals/Pain Pain Assessment: No/denies pain    Home Living                      Prior Function            PT Goals (current goals can now be found in the care plan section) Acute Rehab PT Goals Patient Stated Goal: try to go home PT Goal Formulation: With patient/family Time For Goal Achievement: 06/16/18 Potential to Achieve Goals: Good    Frequency    7X/week      PT Plan Current plan remains appropriate    Co-evaluation              AM-PAC PT "6 Clicks" Daily Activity  Outcome Measure  Difficulty turning over in bed (including adjusting bedclothes, sheets and blankets)?: A Little Difficulty moving from lying on back to sitting on the side of the bed? : A Little Difficulty sitting down on and standing up from a chair with arms (e.g., wheelchair, bedside commode, etc,.)?: A Little Help needed moving to and from a bed to chair (including a wheelchair)?: A Little Help needed walking in hospital room?: A Little Help needed climbing 3-5 steps with a railing? : A Little 6 Click Score: 18    End of Session Equipment Utilized During Treatment: Gait belt Activity Tolerance: Patient tolerated treatment well Patient left: in chair;with chair alarm set;with call [place]/phone within reach;with family/visitor present   PT Visit Diagnosis: Unsteadiness on feet (R26.81);Muscle weakness (generalized) (M62.81)     Time: 1914-7829 PT Time Calculation (min) (ACUTE ONLY): 25 min  Charges:  $Therapeutic Activity: 8-22 mins $Neuromuscular Re-education: 8-22 mins                     Glenetta Hew, PT, DPT    Glenetta Hew 06/03/2018,  9:03 AM

## 2018-06-03 NOTE — Procedures (Signed)
ELECTROENCEPHALOGRAM REPORT   Patient: Alexander MeadowRaymond E Shon Jr.       Room #: 120A-AA EEG No. ID: [id number] Age: 77 y.o.        Sex: male Referring Physician: Nemiah CommanderKalisetti Report Date:  06/03/2018        Interpreting Physician: Alexander Kline, Alexander Kline  History: Alexander MeadowRaymond E Escoe Jr. is an 77 y.o. male with episodic confusion  Medications:  Plavix, ASA, Lipitor, Vasotec, Insulin, Protonix  Conditions of Recording:  This is a 21 channel routine scalp EEG performed with bipolar and monopolar montages arranged in accordance to the international 10/20 system of electrode placement. One channel was dedicated to EKG recording.  The patient is in the confused state.  Description:  The background activity is slow and poorly organized.  It consists of a low voltage theta and beta activity that is diffusely distributed and continuous.  No well sustained posterior background rhythm is noted.   Stage II sleep is not obtained.   No epileptiform activity is noted.  l Hyperventilation and intermittent photic stimulation were not performed.   IMPRESSION: This is a normal drowsy electroencephalogram. There are no focal lateralizing or epileptiform features.   Alexander FarrLeslie Anntoinette Haefele, MD Neurology [phone] 06/03/2018, 3:50 PM

## 2018-06-03 NOTE — Care Management (Signed)
Patient admitted from home with CVA. He has had a significant changed in mental status with confusion.  Lives alone and is separated from his wife.  Daughter Efraim KaufmannMelissa provided with list of continuous in home caregiver support agencies.  Patient will require round the clock supervision initially. Is agreeable to home with home health.  Agency preference is [facility] as had service by agency on a previous occasion. Called referral to [facility] and accepted for RN PT OT Aide. Discussed anticipated discharge on 8/6 with patient and daughter

## 2018-06-03 NOTE — Progress Notes (Signed)
[employer] - [place] at [facility]   PATIENT NAME: Alexander Kline    MR#:  [id number]  DATE OF BIRTH:  Sep 21, 1941  SUBJECTIVE:  CHIEF COMPLAINT:   Chief Complaint  Patient presents with  . Altered Mental Status   - more alert today, sitting in a chair - daughter at bedside now  REVIEW OF SYSTEMS:  Review of Systems  Constitutional: Negative for chills and fever.  HENT: Negative for congestion, ear discharge, hearing loss and nosebleeds.   Eyes: Negative for blurred vision and double vision.  Respiratory: Negative for cough, shortness of breath and wheezing.   Cardiovascular: Negative for chest pain, palpitations and leg swelling.  Gastrointestinal: Negative for abdominal pain, constipation, diarrhea, nausea and vomiting.  Genitourinary: Negative for dysuria.  Musculoskeletal: Negative for myalgias.  Neurological: Negative for dizziness, focal weakness, seizures, weakness and headaches.    DRUG ALLERGIES:   Allergies  Allergen Reactions  . Sulfa Antibiotics     Per pt, he gets bad headaches with sulfa drugs    VITALS:  Blood pressure (!) 154/84, pulse 72, temperature 98.6 F (37 C), resp. rate 20, height 5\' 11"  (1.803 m), weight 85.2 kg (187 lb 13.3 oz), SpO2 92 %.  PHYSICAL EXAMINATION:  Physical Exam  GENERAL:  77 y.o.-year-old patient lying in the bed, sedated EYES: Pupils equal, round, reactive to light and accommodation. No scleral icterus.  Extraocular movements are intact HEENT: Head atraumatic, normocephalic. Oropharynx and nasopharynx clear.  NECK:  Supple, no jugular venous distention. No thyroid enlargement, no tenderness.  LUNGS: Normal breath sounds bilaterally, no wheezing, rales,rhonchi or crepitation. No use of accessory muscles of respiration. Decreased bibasilar breath sounds CARDIOVASCULAR: S1, S2 normal. No  rubs, or gallops. 2/6 systolic murmur present ABDOMEN: Soft, nontender, nondistended. Bowel sounds present. No organomegaly  or mass.  EXTREMITIES: No pedal edema, cyanosis, or clubbing.  NEUROLOGIC: Cranial nerves II through XII are intact. No facial droop.  Some confusion present.  Equal strength all extremities,.  Sensation is intact.  Gait is not checked. PSYCHIATRIC: The patient is alert and oriented x 3.  Intermittent confusion noted.  SKIN: No obvious rash, lesion, or ulcer.    LABORATORY PANEL:   CBC Recent Labs  Lab 05/30/18 1727  WBC 7.6  HGB 12.4*  HCT 36.1*  PLT 161   ------------------------------------------------------------------------------------------------------------------  Chemistries  Recent Labs  Lab 05/30/18 1052  06/03/18 0441  NA 142   < > 143  K 3.9   < > 3.3*  CL 107   < > 112*  CO2 28   < > 24  GLUCOSE 117*   < > 107*  BUN 19   < > 14  CREATININE 1.13   < > 0.89  CALCIUM 9.2   < > 8.6*  AST 24  --   --   ALT 18  --   --   ALKPHOS 82  --   --   BILITOT 0.9  --   --    < > = values in this interval not displayed.   ------------------------------------------------------------------------------------------------------------------  Cardiac Enzymes Recent Labs  Lab 05/30/18 1052  TROPONINI <0.03   ------------------------------------------------------------------------------------------------------------------  RADIOLOGY:  No results found.  EKG:   Orders placed or performed during the hospital encounter of 05/30/18  . ED EKG  . ED EKG  . EKG 12-Lead  . EKG 12-Lead  . EKG 12-Lead  . EKG 12-Lead    ASSESSMENT AND PLAN:   77y/o M with PMH significant for  Hyperlipidemia, paroxysmal afib not on any anti coagulation, NIDDM brought by daughter secondary to worsening confusion.  1. Acute stroke- MRI brain on adm with punctate left cerebellar infarct - also has moderate volume loss of brain with chronic microvascular ischemic changes MRA with no large vessel occlusion findings -Currently on Plavix.  Aspirin has been added.  Also on statin.  -Neurology  consult appreciated.  Continue neurochecks.  Carotid Dopplers with no hemodynamically significant stenosis and echo with normal EF of 55%, no PFO identified.  No cardiac source of emboli noted. -PT/OT and speech consults appreciated -EKGs with no A. fib, sinus arrhythmia noted  2.  Acute delirium-concern for underlying dementia. -EEG done -Much improved and almost close to baseline  3.  Hypokalemia-replaced  4.  Diabetes mellitus-on sliding scale insulin a1c only 5.3  5.  DVT prophylaxis-Lovenox  6.  Hypertension-on Norvasc and low-dose enalapril added during this hospitalization  Updated daughter at bedside Possible discharge tomorrow.  PT recommended home health    All the records are reviewed and case discussed with Care Management/Social Workerr. Management plans discussed with the patient, family and they are in agreement.  CODE STATUS: Full Code  TOTAL TIME TAKING CARE OF THIS PATIENT: 36 minutes.   POSSIBLE D/C  TOMORROW, DEPENDING ON CLINICAL CONDITION.   Navi Ewton M.D on 06/03/2018 at 2:30 PM  Between 7am to 6pm - Pager - [phone]  After 6pm go to [email or web address] - password [email or web address]  [facility] [place] [facility]  Office  [phone]  CC: Primary care physician; Sherron Mondayejan-Sie, S Ahmed, MD

## 2018-06-03 NOTE — Care Management Important Message (Signed)
Important Message  Patient Details  Name: Alexander MeadowRaymond E Garciagarcia Jr. MRN: [id number] Date of Birth: 07/29/1941   Medicare Important Message Given:  Yes    [name] 06/03/2018, 11:06 AM

## 2018-06-03 NOTE — Evaluation (Signed)
Occupational Therapy Evaluation Patient Details Name: Alexander Kline. MRN: [id number] DOB: February 04, 1941 Today's Date: 06/03/2018    History of Present Illness 77 y/o male here with subacute microvascular infarction.  He apparently has been having some variable confusion/unsteadiness for the last month+, but over the last few days had been considerably worse, unsafe.  Pt had R total hip replacement 8 months ago.   Clinical Impression   Pt seen for OT evaluation this date. Prior to hospital admission, pt was independent living at home alone and going to family's home most afternoons for dinner. Pt drives and enjoys fixing up/rebuilding old cars. Currently pt demonstrates impairments in [facility] L>R, balance, safety awareness, and awareness of deficits. Pt/family instructed in body mechanics for transfers to improve safety and independence. Pt would benefit from skilled OT to address noted impairments and functional limitations (see below for any additional details) in order to maximize safety and independence while minimizing falls risk and caregiver burden.  Upon hospital discharge, recommend pt discharge to home with [facility] services and 24/7 supervision.      Follow Up Recommendations  Home health OT;Supervision/Assistance - 24 hour    Equipment Recommendations  None recommended by OT    Recommendations for Other Services       Precautions / Restrictions Precautions Precautions: Fall Restrictions Weight Bearing Restrictions: No      Mobility Bed Mobility     General bed mobility comments: deferred, up in recliner  Transfers Overall transfer level: Needs assistance Equipment used: Rolling walker (2 wheeled) Transfers: Sit to/from Stand Sit to Stand: Min guard         General transfer comment: verbal cues for hand and foot placement as well as scooting to the edge of the chair to improve safety with transfers    Balance Overall balance assessment: Needs  assistance Sitting-balance support: Feet supported Sitting balance-Leahy Scale: Fair   Standing balance support: Bilateral upper extremity supported Standing balance-Leahy Scale: Fair                       ADL either performed or assessed with clinical judgement   ADL Overall ADL's : Needs assistance/impaired Eating/Feeding: Sitting;Supervision/ safety   Grooming: Sitting;Supervision/safety   Upper Body Bathing: Sitting;Supervision/ safety   Lower Body Bathing: Sit to/from stand;Min guard   Upper Body Dressing : Sitting;Supervision/safety   Lower Body Dressing: Sit to/from stand;Min guard   Toilet Transfer: RW;Min guard;Ambulation;Comfort height toilet;Cueing for safety           Functional mobility during ADLs: Rolling walker;Min guard       Vision Baseline Vision/History: Wears glasses Wears Glasses: At all times Patient Visual Report: No change from baseline Vision Assessment?: No apparent visual deficits     Perception     Praxis      Pertinent Vitals/Pain Pain Assessment: No/denies pain     Hand Dominance Right   Extremity/Trunk Assessment Upper Extremity Assessment Upper Extremity Assessment: (grossly 4/5 bilaterally, decreased b/l hand coordination L>R, intact sensation)   Lower Extremity Assessment Lower Extremity Assessment: Defer to PT evaluation(functional strength, L leg extension weak (~3+/5) than R)   Cervical / Trunk Assessment Cervical / Trunk Assessment: Normal   Communication Communication Communication: No difficulties   Cognition Arousal/Alertness: Awake/alert Behavior During Therapy: WFL for tasks assessed/performed Overall Cognitive Status: Impaired/Different from baseline Area of Impairment: Awareness;Safety/judgement;Orientation;Following commands                 Orientation Level: Disoriented to;Situation  Following Commands: (follows multi-step commands with increased cues) Safety/Judgement: Decreased  awareness of safety     General Comments: Presents with intermittent confusion and slightly restless.  Follows commands with occasional inconsistency.  Alert to self and month. Requires cues for safety   General Comments       Exercises Other Exercises Other Exercises: Pt/family instructed in functional transfers and hand/foot placement to improve safety and independence with transfers, requiring additional cues for safety to support recall and carryover   Shoulder Instructions      Home Living Family/patient expects to be discharged to:: Private residence Living Arrangements: Alone Available Help at Discharge: Family;Available PRN/intermittently;Available 24 hours/day(Pt typically with wife/family ~4 hrs in the afternoon around dinner time) Type of Home: House Home Access: Level entry     Home Layout: Two level;Able to live on main level with bedroom/bathroom(split level home)     Bathroom Shower/Tub: Chief Strategy OfficerTub/shower unit   Bathroom Toilet: Standard     Home Equipment: Environmental consultantWalker - 2 wheels;Cane - single point   Additional Comments: DME per chart review      Prior Functioning/Environment Level of Independence: Independent        Comments: Per pt/family in room, pt has had 4 falls this year but had recovered from hip replacement in December to driving, getting around w/o AD, etc.         OT Problem List: Decreased strength;Decreased cognition;Decreased safety awareness;Impaired balance (sitting and/or standing);Decreased coordination;Decreased knowledge of use of DME or AE      OT Treatment/Interventions: Self-care/ADL training;Balance training;Therapeutic exercise;Therapeutic activities;DME and/or AE instruction;Patient/family education;Cognitive remediation/compensation    OT Goals(Current goals can be found in the care plan section) Acute Rehab OT Goals Patient Stated Goal: try to go home OT Goal Formulation: With patient Time For Goal Achievement: 06/17/18 Potential to  Achieve Goals: Good ADL Goals Pt Will Perform Lower Body Dressing: with supervision;sit to/from stand Pt Will Transfer to Toilet: with supervision;ambulating;regular height toilet(LRAD for amb) Additional ADL Goal #1: Pt will safely perform ADL tasks with no verbal cues for safety required, 5/5 opportunities.  OT Frequency: Min 1X/week   Barriers to D/C:            Co-evaluation              AM-PAC PT "6 Clicks" Daily Activity     Outcome Measure Help from another person eating meals?: None Help from another person taking care of personal grooming?: None Help from another person toileting, which includes using toliet, bedpan, or urinal?: A Little Help from another person bathing (including washing, rinsing, drying)?: A Little Help from another person to put on and taking off regular upper body clothing?: None Help from another person to put on and taking off regular lower body clothing?: A Little 6 Click Score: 21   End of Session Equipment Utilized During Treatment: Gait belt;Rolling walker  Activity Tolerance: Patient tolerated treatment well Patient left: in chair;with call Pillay/phone within reach;with chair alarm set;with family/visitor present  OT Visit Diagnosis: Other abnormalities of gait and mobility (R26.89);Repeated falls (R29.6);Muscle weakness (generalized) (M62.81);Other symptoms and signs involving cognitive function                Time: [id number] OT Time Calculation (min): 29 min Charges:  OT General Charges $OT Visit: 1 Visit OT Evaluation $OT Eval Low Complexity: 1 Low OT Treatments $Self Care/Home Management : 8-22 mins  Richrd PrimeJamie Stiller, MPH, MS, OTR/L ascom [phone] 06/03/18, 10:41 AM

## 2018-06-04 LAB — BASIC METABOLIC PANEL
Anion gap: 7 (ref 5–15)
BUN: 18 mg/dL (ref 8–23)
CHLORIDE: 110 mmol/L (ref 98–111)
CO2: 25 mmol/L (ref 22–32)
CREATININE: 0.96 mg/dL (ref 0.61–1.24)
Calcium: 8.8 mg/dL — ABNORMAL LOW (ref 8.9–10.3)
GFR calc Af Amer: >60 mL/min (ref >60)
GFR calc non Af Amer: >60 mL/min (ref >60)
Glucose, Bld: 105 mg/dL — ABNORMAL HIGH (ref 70–99)
Potassium: 3.5 mmol/L (ref 3.5–5.1)
Sodium: 142 mmol/L (ref 135–145)

## 2018-06-04 LAB — CBC
HCT: 36 % — ABNORMAL LOW (ref 40.0–52.0)
Hemoglobin: 12.7 g/dL — ABNORMAL LOW (ref 13.0–18.0)
MCH: 32.4 pg (ref 26.0–34.0)
MCHC: 35.2 g/dL (ref 32.0–36.0)
MCV: 92.1 fL (ref 80.0–100.0)
PLATELETS: 171 10*3/uL (ref 150–440)
RBC: 3.91 MIL/uL — AB (ref 4.40–5.90)
RDW: 15.3 % — AB (ref 11.5–14.5)
WBC: 9.1 10*3/uL (ref 3.8–10.6)

## 2018-06-04 LAB — GLUCOSE, CAPILLARY: Glucose-Capillary: 100 mg/dL — ABNORMAL HIGH (ref 70–99)

## 2018-06-04 MED ORDER — ASPIRIN 81 MG PO CHEW: 81.0000 mg | CHEWABLE_TABLET | Freq: Every day | ORAL | 1 refills | Status: DC

## 2018-06-04 MED ORDER — CLOPIDOGREL BISULFATE 75 MG PO TABS
75.0000 mg | ORAL_TABLET | Freq: Every day | ORAL | 1 refills | Status: DC
Start: 2018-06-04 — End: 2020-07-13

## 2018-06-04 MED ORDER — AMLODIPINE BESYLATE 10 MG PO TABS
10.0000 mg | ORAL_TABLET | Freq: Every day | ORAL | 1 refills | Status: DC
Start: 2018-06-04 — End: 2020-07-13

## 2018-06-04 MED ORDER — ATORVASTATIN CALCIUM 80 MG PO TABS: 80.0000 mg | ORAL_TABLET | Freq: Every day | ORAL | 1 refills | Status: DC

## 2018-06-04 NOTE — Discharge Summary (Signed)
[facility] - [place] at [facility]   PATIENT NAME: Alexander Kline    MR#:  [id number]  DATE OF BIRTH:  April 16, 1941  DATE OF ADMISSION:  05/30/2018   ADMITTING PHYSICIAN: Bertrum Sol, MD  DATE OF DISCHARGE:  06/04/2018 PRIMARY CARE PHYSICIAN: Sherron Monday, MD   ADMISSION DIAGNOSIS:  Cerebrovascular accident (CVA), unspecified mechanism (HCC) [I63.9] Hypertension, unspecified type [I10] DISCHARGE DIAGNOSIS:  Active Problems:   CVA (cerebral vascular accident) (HCC)  SECONDARY DIAGNOSIS:   Past Medical History:  Diagnosis Date  . Hyperlipidemia   . Pre-diabetes    HOSPITAL COURSE:   77y/o M with PMH significant for Hyperlipidemia, paroxysmal afib not on any anti coagulation, NIDDM brought by daughter secondary to worsening confusion.  1. Acute stroke- MRI brain on adm with punctate left cerebellar infarct - also has moderate volume loss of brain with chronic microvascular ischemic changes MRA with no large vessel occlusion findings -Currently on Plavix.  Aspirin has been added.  Also on statin.  -Neurology consult appreciated.  Continue neurochecks.  Carotid Dopplers with no hemodynamically significant stenosis and echo with normal EF of 55%, no PFO identified.  No cardiac source of emboli noted. -PT/OT and speech consults appreciated -EKGs with no A. fib, sinus arrhythmia noted, HHPT.  2.  Acute delirium-concern for underlying dementia. -EEG is unremarkable. -Much improved and almost close to baseline  3.  Hypokalemia-replaced, improved.  4.  Diabetes mellitus-on sliding scale insulin a1c only 5.3  5.  DVT prophylaxis-Lovenox  6.  Hypertension-on Norvasc and low-dose enalapril added during this hospitalization  DISCHARGE CONDITIONS:  Stable, discharge home with home health today. CONSULTS OBTAINED:  Treatment Team:  Thana Farr, MD DRUG ALLERGIES:   Allergies  Allergen Reactions  . Sulfa Antibiotics     Per pt, he gets  bad headaches with sulfa drugs   DISCHARGE MEDICATIONS:   Allergies as of 06/04/2018      Reactions   Sulfa Antibiotics    Per pt, he gets bad headaches with sulfa drugs      Medication List    TAKE these medications   acetaminophen 325 MG tablet Commonly known as:  TYLENOL Take 2 tablets (650 mg total) by mouth every 6 (six) hours as needed for mild pain (or Fever >/= 101).   amLODipine 10 MG tablet Commonly known as:  NORVASC Take 1 tablet (10 mg total) by mouth daily.   aspirin 81 MG chewable tablet Chew 1 tablet (81 mg total) by mouth daily.   atorvastatin 80 MG tablet Commonly known as:  LIPITOR Take 1 tablet (80 mg total) by mouth daily. What changed:    medication strength  how much to take   clopidogrel 75 MG tablet Commonly known as:  PLAVIX Take 1 tablet (75 mg total) by mouth daily.   enalapril 2.5 MG tablet Commonly known as:  VASOTEC Take 2.5 mg by mouth daily.   ferrous sulfate 325 (65 FE) MG tablet Take 1 tablet (325 mg total) by mouth 2 (two) times daily with a meal.   meloxicam 15 MG tablet Commonly known as:  MOBIC Take 15 mg by mouth daily.   metFORMIN 500 MG 24 hr tablet Commonly known as:  GLUCOPHAGE-XR Take 500 mg by mouth daily.   methocarbamol 500 MG tablet Commonly known as:  ROBAXIN Take 500 mg by mouth 2 (two) times daily.   pantoprazole 40 MG tablet Commonly known as:  PROTONIX Take 1 tablet (40 mg total) by mouth daily.  DISCHARGE INSTRUCTIONS:  See AVS.  If you experience worsening of your admission symptoms, develop shortness of breath, life threatening emergency, suicidal or homicidal thoughts you must seek medical attention immediately by calling 911 or calling your MD immediately  if symptoms less severe.  You Must read complete instructions/literature along with all the possible adverse reactions/side effects for all the Medicines you take and that have been prescribed to you. Take any new Medicines after you  have completely understood and accpet all the possible adverse reactions/side effects.   Please note  You were cared for by a hospitalist during your hospital stay. If you have any questions about your discharge medications or the care you received while you were in the hospital after you are discharged, you can call the unit and asked to speak with the hospitalist on call if the hospitalist that took care of you is not available. Once you are discharged, your primary care physician will handle any further medical issues. Please note that NO REFILLS for any discharge medications will be authorized once you are discharged, as it is imperative that you return to your primary care physician (or establish a relationship with a primary care physician if you do not have one) for your aftercare needs so that they can reassess your need for medications and monitor your lab values.    On the day of Discharge:  VITAL SIGNS:  Blood pressure 136/89, pulse 65, temperature 98.3 F (36.8 C), temperature source Oral, resp. rate 18, height 5\' 11"  (1.803 m), weight 187 lb 13.3 oz (85.2 kg), SpO2 98 %. PHYSICAL EXAMINATION:  GENERAL:  77 y.o.-year-old patient lying in the bed with no acute distress.  EYES: Pupils equal, round, reactive to light and accommodation. No scleral icterus. Extraocular muscles intact.  HEENT: Head atraumatic, normocephalic. Oropharynx and nasopharynx clear.  NECK:  Supple, no jugular venous distention. No thyroid enlargement, no tenderness.  LUNGS: Normal breath sounds bilaterally, no wheezing, rales,rhonchi or crepitation. No use of accessory muscles of respiration.  CARDIOVASCULAR: S1, S2 normal. No murmurs, rubs, or gallops.  ABDOMEN: Soft, non-tender, non-distended. Bowel sounds present. No organomegaly or mass.  EXTREMITIES: No pedal edema, cyanosis, or clubbing.  NEUROLOGIC: Cranial nerves II through XII are intact. Muscle strength 4/5 in all extremities. Sensation intact. Gait not  checked.  PSYCHIATRIC: The patient is alert and oriented x 3.  SKIN: No obvious rash, lesion, or ulcer.  DATA REVIEW:   CBC Recent Labs  Lab 06/04/18 0425  WBC 9.1  HGB 12.7*  HCT 36.0*  PLT 171    Chemistries  Recent Labs  Lab 05/30/18 1052  06/04/18 0425  NA 142   < > 142  K 3.9   < > 3.5  CL 107   < > 110  CO2 28   < > 25  GLUCOSE 117*   < > 105*  BUN 19   < > 18  CREATININE 1.13   < > 0.96  CALCIUM 9.2   < > 8.8*  AST 24  --   --   ALT 18  --   --   ALKPHOS 82  --   --   BILITOT 0.9  --   --    < > = values in this interval not displayed.     Microbiology Results  Results for orders placed or performed during the hospital encounter of 09/28/17  Surgical PCR screen     Status: Abnormal   Collection Time: 09/28/17  5:43 PM  Result Value Ref Range Status   MRSA, PCR NEGATIVE NEGATIVE Final   Staphylococcus aureus POSITIVE (A) NEGATIVE Final    Comment: (NOTE) The Xpert SA Assay (FDA approved for NASAL specimens in patients 54 years of age and older), is one component of a comprehensive surveillance program. It is not intended to diagnose infection nor to guide or monitor treatment.   CULTURE, BLOOD (ROUTINE X 2) w Reflex to ID Panel     Status: None   Collection Time: 09/29/17  8:06 AM  Result Value Ref Range Status   Specimen Description BLOOD RIGHT ANTECUBITAL  Final   Special Requests   Final    BOTTLES DRAWN AEROBIC AND ANAEROBIC Blood Culture adequate volume   Culture NO GROWTH 5 DAYS  Final   Report Status 10/04/2017 FINAL  Final  CULTURE, BLOOD (ROUTINE X 2) w Reflex to ID Panel     Status: None   Collection Time: 09/29/17  8:13 AM  Result Value Ref Range Status   Specimen Description BLOOD RESISTANT HAND  Final   Special Requests   Final    BOTTLES DRAWN AEROBIC AND ANAEROBIC Blood Culture adequate volume   Culture NO GROWTH 5 DAYS  Final   Report Status 10/04/2017 FINAL  Final  Urine Culture     Status: None   Collection Time: 09/30/17  1:40  PM  Result Value Ref Range Status   Specimen Description URINE, RANDOM  Final   Special Requests NONE  Final   Culture   Final    NO GROWTH Performed at [facility], [address] [place], [place] [address]    Report Status 10/02/2017 FINAL  Final    RADIOLOGY:  No results found.   Management plans discussed with the patient, her daughter and they are in agreement.  CODE STATUS: Full Code   TOTAL TIME TAKING CARE OF THIS PATIENT: 33 minutes.    Shaune Pollack M.D on 06/04/2018 at 11:01 AM  Between 7am to 6pm - Pager - [phone]  After 6pm go to [email or web address] - Social research officer, government  [employer]  Hospitalists  Office  [phone]  CC: Primary care physician; Sherron Monday, MD   Note: This dictation was prepared with Dragon dictation along with smaller phrase technology. Any transcriptional errors that result from this process are unintentional.

## 2018-06-04 NOTE — Care Management Note (Signed)
Case Management Note  Patient Details  Name: Fransico MeadowRaymond E Partridge Jr. MRN: [id number] Date of Birth: 06/02/1941  Subjective/Objective:    Patient to be discharged per MD order. Orders in place for home health services. Previous RNCM had worked up for home health services [facility]. This continues to patients preference. Referral confirmed with Barbara CowerJason from [facility] of RN, PT,OT and aide services. Family to provide transport.  Buddy DutyJosh Zyhir Cappella RN BSN RNCM [phone]                  Action/Plan:   Expected Discharge Date:  06/04/18               Expected Discharge Plan:     In-House Referral:     Discharge planning Services  CM Consult  Post Acute Care Choice:  Home Health Choice offered to:  Patient, Adult Children  DME Arranged:    DME Agency:     HH Arranged:  RN, PT, OT, Nurse's Aide HH Agency:  [facility]  Status of Service:  Completed, signed off  If discussed at Long Length of Stay Meetings, dates discussed:    Additional Comments:  Virgel ManifoldJosh A Dashan Chizmar, RN 06/04/2018, 11:57 AM

## 2018-06-04 NOTE — Discharge Instructions (Signed)
HHPT, aspiration and fall precaution. Dysphagia 3 diet.

## 2018-06-04 NOTE — Progress Notes (Signed)
Discharge instructions given and went over with patient and patients family at bedside. Prescriptions given and reviewed. All questions answered. Patient discharged home with family via wheelchair by volunteer services. Bo McclintockBrewer,Danique Hartsough S, RN

## 2018-06-26 ENCOUNTER — Ambulatory Visit: Payer: Medicare Other | Attending: Internal Medicine | Admitting: Physical Therapy

## 2018-06-26 ENCOUNTER — Encounter: Payer: Self-pay | Admitting: Physical Therapy

## 2018-06-26 DIAGNOSIS — M25551 Pain in right hip: Secondary | ICD-10-CM | POA: Diagnosis present

## 2018-06-26 DIAGNOSIS — R2689 Other abnormalities of gait and mobility: Secondary | ICD-10-CM | POA: Diagnosis present

## 2018-06-26 DIAGNOSIS — M6281 Muscle weakness (generalized): Secondary | ICD-10-CM | POA: Diagnosis present

## 2018-06-26 NOTE — Therapy (Deleted)
[facility] [address] [place], [place], [address] Phone: [phone]   Fax:  [phone]  Physical Therapy Treatment  Patient Details  Name: Alexander MeadowRaymond E Pullin Jr. MRN: [id number] Date of Birth: 05/24/1941 No data recorded  Encounter Date: 06/26/2018    Past Medical History:  Diagnosis Date  . Hyperlipidemia   . Pre-diabetes     Past Surgical History:  Procedure Laterality Date  . HIP ARTHROPLASTY Right 09/29/2017   Procedure: ARTHROPLASTY BIPOLAR HIP (HEMIARTHROPLASTY);  Surgeon: Deeann SaintMiller, Howard, MD;  Location: [facility] ORS;  Service: Orthopedics;  Laterality: Right;    There were no vitals filed for this visit.  Subjective Assessment - 06/26/18 1031    Subjective  Pt. is plesant 77 y.o. male seeking therapy for balance deficits and pain in R hip and medial portion of R LE.  Pt[place] walk around and eat around the the area.      Patient is accompained by:  Family member    Limitations  Standing;Walking    How long can you walk comfortably?  Inital pain, however can continue to walk as he continues to walk.    Patient Stated Goals  No discomfort with walking.    Currently in Pain?  Yes    Pain Score  0-No pain    Pain Location  Hip    Pain Orientation  Right    Pain Descriptors / Indicators  Discomfort    Pain Type  Chronic pain    Pain Radiating Towards  Medial Knee    Pain Onset  More than a month ago    Pain Frequency  Intermittent    Aggravating Factors   Standing from seated position.      Pain Relieving Factors  Walking a few steps after standing up.         [facility] - 06/26/18 0001      Assessment   Medical Diagnosis  Stroke, Imbalance    Hand Dominance  Right    Next MD Visit  July 22, 2018    Prior Therapy  Yes      Balance Screen   Has the patient fallen in the past 6 months  Yes    How many times?  4    Has the patient had a decrease in activity  level because of a fear of falling?   No    Is the patient reluctant to leave their home because of a fear of falling?   No      Home Environment   Living Environment  Private residence    Living Arrangements  Alone    Available Help at Discharge  Family    Type of Home  House    Home Access  Stairs to enter    Entrance Stairs-Number of Steps  8    Entrance Stairs-Rails  Cannot reach both;Left;Right    Home Layout  Multi-level;Able to live on main level with bedroom/bathroom    Alternate Level Stairs-Number of Steps  8    Alternate Level Stairs-Rails  Right    Home Equipment  Walker - standard;Cane - quad      Prior Function   Level of Independence  Independent    Vocation  Part time employment            EVALUATION  Pain Present: 0/10 Best: 0/10 Worst: 4/10   Gait / Mobility Pt. Walks well when walking normal  pace, however has difficulty with head turns and has several LOB that required external correction to prevent crossing of feet.  ROM / MMT  Hip R L Flex 4 4   Knee R L Flex 4+ 5  Ext 5 5   Hamstring R L SLR  111 121 90/90  138 141   Special Tests Berg: 52/56 DGI: 21/24 5TSTS: 24.30 sec TUG: 14.93 sec        Patient will benefit from skilled therapeutic intervention in order to improve the following deficits and impairments:      Visit Diagnosis: No diagnosis found.     Problem List Patient Active Problem List   Diagnosis Date Noted  . CVA (cerebral vascular accident) (HCC) 05/30/2018  . Hip fracture, unspecified laterality, closed, initial encounter (HCC) 09/28/2017  . Hip fracture (HCC) 09/28/2017    Nolon Bussing, SPT 06/26/2018, 10:45 AM  [facility] [address] [place], [place], [address] Phone: [phone]   Fax:  [phone]  Name: Alexander Kline. MRN: [id number] Date of Birth: [birth year]

## 2018-06-26 NOTE — Patient Instructions (Signed)
Access Code: [email or web address]  URL: [email or web address]  Date: 06/26/2018  Prepared by: Dorene GrebeMichael Okema Rollinson   Exercises  Standing Marching - 15 reps - 2 sets - 1x daily - 7x weekly  Standing Hip Extension with Counter Support - 15 reps - 2 sets - 1x daily - 7x weekly  Standing Hip Abduction - 15 reps - 2 sets - 1x daily - 7x weekly  Supine Figure 4 Piriformis Stretch - 4 reps - 1 sets - 20 hold - 1x daily - 7x weekly  Side Lunge Adductor Stretch - 4 reps - 1 sets - 20 hold - 1x daily - 7x weekly

## 2018-06-28 NOTE — Therapy (Signed)
[facility] [address] [place], [place], [address] Phone: [phone]   Fax:  [phone]  Physical Therapy Evaluation  Patient Details  Name: Alexander Kline. MRN: [id number] Date of Birth: 02/10/1941 Referring Provider: Eustaquio Boyden AHMED   Encounter Date: 06/26/2018  PT End of Session - 06/27/18 1743    Visit Number  1    Number of Visits  8    Date for PT Re-Evaluation  08/22/18    PT Start Time  1026    PT Stop Time  1118    PT Time Calculation (min)  52 min    Equipment Utilized During Treatment  Gait belt    Activity Tolerance  Patient tolerated treatment well    Behavior During Therapy  WFL for tasks assessed/performed       Past Medical History:  Diagnosis Date  . Hyperlipidemia   . Pre-diabetes     Past Surgical History:  Procedure Laterality Date  . HIP ARTHROPLASTY Right 09/29/2017   Procedure: ARTHROPLASTY BIPOLAR HIP (HEMIARTHROPLASTY);  Surgeon: Deeann Saint, MD;  Location: [facility] ORS;  Service: Orthopedics;  Laterality: Right;    There were no vitals filed for this visit.     Pt. is pleasant 77 y.o. male seeking therapy for balance deficits and pain in R hip and medial portion of R LE. Pt. reports that he enjoys going [facility] just to walk around and eat around the the area. Pt. reports he received a corticosteroid injection in his hip back in April that did not help with his pain. Pt. reported that he received another injection a few weeks following, that did assist with the pain. Pt. reports partial hip replacement in R hip, that has hx of bursitus. Pt. repors imaging was conducted and no bursitus was present during this event. Pt. and daughter report pt. has had TIA's prior to stroke from 3.5 weeks ago. Pt. seeking therapy for balance, gait, and pain in R hip.    See HEP      PT Education - 06/27/18 1743    Education Details  pt. educated on HEP given.    Person(s) Educated   Patient;Child(ren)    Methods  Explanation;Demonstration;Tactile cues;Verbal cues;Handout    Comprehension  Verbalized understanding;Returned demonstration          PT Long Term Goals - 06/27/18 1806      PT LONG TERM GOAL #1   Title  Pt will improve BERG by at least 3 points in order to demonstrate clinically significant improvement in balance.     Baseline  8/28 Berg: 52/56    Time  8    Period  Weeks    Status  New    Target Date  08/22/18      PT LONG TERM GOAL #2   Title  Pt will decrease 5TSTS by at least 3 seconds in order to demonstrate clinically significant improvement in LE strength    Baseline  8/28 5TSTS: 24.30    Time  8    Period  Weeks    Status  New    Target Date  08/22/18      PT LONG TERM GOAL #3   Title  Pt will decrease TUG to below 14 seconds in order to demonstrate decreased fall risk    Baseline  8/28 TUG: 14.93    Time  8    Period  Weeks    Status  New    Target Date  08/22/18      PT LONG TERM GOAL #4   Title  Pt. will complete FOTO and improve to 69 to improve daily functional mobility.    Baseline  8/28 FOTO: 67    Time  8    Period  Weeks    Status  New    Target Date  08/22/18             Plan - 06/27/18 1744    Clinical Impression Statement  Pt. is pleasant 77 y.o. male seeking therapy with referral from MD for pain in R hip and gait difficulties.  Pt. has present hx. of several falls occuring and demonstrates weakness in B hip flexors (R/L : 4/4), and R knee flexion (4+/5).  Pt. can walk fairly well with no AD and no LOB, however struggles with certain balance portions of outcome measures.  Pt. demonstrated decreased ability to turn 360 without having mild displacement of feet during the Berg, and has difficulty with horizontal head turns without deviating from walking path and changing gait speeds.  Pt. will benefit from therapy to increase strength of B LE and increase balance necessary for reduced risk of falls.     Clinical  Presentation  Evolving    Clinical Decision Making  Moderate    Rehab Potential  Fair    PT Frequency  1x / week    PT Duration  8 weeks    PT Treatment/Interventions  Canalith Repostioning;Cryotherapy;Electrical Stimulation;Moist Heat;Ultrasound;Therapeutic exercise;Therapeutic activities;Functional mobility training;Stair training;Gait training;Balance training;Neuromuscular re-education;Manual techniques;Dry needling;Passive range of motion    PT Next Visit Plan  assess HEP and perform hip strengthening exercises.    PT Home Exercise Plan  see HEP.    Consulted and Agree with Plan of Care  Patient;Family member/caregiver    Family Member Consulted  Daughter       Patient will benefit from skilled therapeutic intervention in order to improve the following deficits and impairments:  Abnormal gait, Impaired sensation, Pain, Decreased mobility, Decreased endurance, Decreased activity tolerance, Decreased range of motion, Decreased strength, Decreased balance, Impaired flexibility  Visit Diagnosis: Muscle weakness (generalized)  Other abnormalities of gait and mobility  Balance problem  Pain in right hip     Problem List Patient Active Problem List   Diagnosis Date Noted  . CVA (cerebral vascular accident) (HCC) 05/30/2018  . Hip fracture, unspecified laterality, closed, initial encounter (HCC) 09/28/2017  . Hip fracture (HCC) 09/28/2017   Cammie McgeeMichael C Naquisha Whitehair, PT, DPT # [id number] 06/28/2018, 11:10 AM   [facility] [address] [place], [place], [address] Phone: [phone]   Fax:  [phone]  Name: Alexander Meadowaymond E Milsap Jr. MRN: [id number] Date of Birth: 05/10/1941

## 2018-07-03 ENCOUNTER — Ambulatory Visit: Payer: Medicare Other | Attending: Internal Medicine | Admitting: Physical Therapy

## 2018-07-03 ENCOUNTER — Encounter: Payer: Self-pay | Admitting: Physical Therapy

## 2018-07-03 DIAGNOSIS — M6281 Muscle weakness (generalized): Secondary | ICD-10-CM

## 2018-07-03 DIAGNOSIS — M25551 Pain in right hip: Secondary | ICD-10-CM

## 2018-07-03 DIAGNOSIS — R2689 Other abnormalities of gait and mobility: Secondary | ICD-10-CM

## 2018-07-03 NOTE — Therapy (Signed)
[facility] [address] [place], [place], [address] Phone: [phone]   Fax:  [phone]  Physical Therapy Treatment  Patient Details  Name: Alexander Kline. MRN: [id number] Date of Birth: Nov 01, 1940 Referring Provider: Eustaquio Boyden AHMED   Encounter Date: 07/03/2018  PT End of Session - 07/03/18 1719    Visit Number  2    Number of Visits  8    Date for PT Re-Evaluation  08/22/18    PT Start Time  1024    PT Stop Time  1118    PT Time Calculation (min)  54 min    Equipment Utilized During Treatment  Gait belt    Activity Tolerance  Patient tolerated treatment well    Behavior During Therapy  WFL for tasks assessed/performed       Past Medical History:  Diagnosis Date  . Hyperlipidemia   . Pre-diabetes     Past Surgical History:  Procedure Laterality Date  . HIP ARTHROPLASTY Right 09/29/2017   Procedure: ARTHROPLASTY BIPOLAR HIP (HEMIARTHROPLASTY);  Surgeon: Deeann Saint, MD;  Location: [facility] ORS;  Service: Orthopedics;  Laterality: Right;    There were no vitals filed for this visit.  Subjective Assessment - 07/03/18 1038    Subjective  Pt. reports no new complaints today.  Daughter brings pt. to therapy today and is interested in performing massage therapy for pt. (daughter is licensed massage therapist).  Pt. and daughter both report increase in ability for perform exercises and also decreased pain from adductors.  Pt. states that leg is feeling much better and stretches given are working well.    Patient is accompained by:  Family member    Limitations  Standing;Walking    How long can you walk comfortably?  Inital pain, however can continue to walk as he continues to walk.    Patient Stated Goals  No discomfort with walking.    Currently in Pain?  No/denies    Pain Score  0-No pain   1-2/10 when walking in, but 0/10 when at rest.   Pain Location  Hip    Pain Orientation  Right    Pain Descriptors / Indicators   Discomfort    Pain Onset  More than a month ago          Treatment   Manual:  Supine B Distal Hamstring Stretch 4x30 sec each Supine B Proximal Hamstring Stretch 4x30 sec each Supine B Piriformis Stretch 8x30 sec each Supine B Figure-4 Stretch 4x30 sec each Supine B IR/ER Stretch 4x30 sec each Supine B Hip Adductor Stretch 8x30 sec each  Pt. Was educated on terminology/differences in pain vs. stretch along with daughter.  Daughter was very Adult nurse as pt. Has had difficulty in past differentiating/communicating pain.  Neuro:  Ambulation outside across varying surfaces (grass, mulch, pavement, curbs, etc.)     PT Long Term Goals - 06/27/18 1806      PT LONG TERM GOAL #1   Title  Pt will improve BERG by at least 3 points in order to demonstrate clinically significant improvement in balance.     Baseline  8/28 Berg: 52/56    Time  8    Period  Weeks    Status  New    Target Date  08/22/18      PT LONG TERM GOAL #2   Title  Pt will decrease 5TSTS by at least 3 seconds in order to demonstrate clinically significant improvement in LE strength  Baseline  8/28 5TSTS: 24.30    Time  8    Period  Weeks    Status  New    Target Date  08/22/18      PT LONG TERM GOAL #3   Title  Pt will decrease TUG to below 14 seconds in order to demonstrate decreased fall risk    Baseline  8/28 TUG: 14.93    Time  8    Period  Weeks    Status  New    Target Date  08/22/18      PT LONG TERM GOAL #4   Title  Pt. will complete FOTO and improve to 69 to improve daily functional mobility.    Baseline  8/28 FOTO: 67    Time  8    Period  Weeks    Status  New    Target Date  08/22/18            Plan - 07/03/18 1720    Clinical Impression Statement  Pt. is performing really well since initial evaluation.  Pt. reports less tenderness and tightness in adductor with stretches.  Pt. is moving much better and has much more consistent gait pattern after stretches are performed.   Daughter was taught technique per request and possible other interventions.  Massage therapy was recommended if possible.  Pt. performed well outside walking across varying surfaces as well.  Pt. will continue to benefit from skilled therapy to increase balance and decrease pain in R hip.    Clinical Presentation  Evolving    Clinical Decision Making  Moderate    Rehab Potential  Fair    PT Frequency  1x / week    PT Duration  8 weeks    PT Treatment/Interventions  Canalith Repostioning;Cryotherapy;Electrical Stimulation;Moist Heat;Ultrasound;Therapeutic exercise;Therapeutic activities;Functional mobility training;Stair training;Gait training;Balance training;Neuromuscular re-education;Manual techniques;Dry needling;Passive range of motion    PT Next Visit Plan  assess HEP and perform hip strengthening exercises.    PT Home Exercise Plan  see HEP.    Consulted and Agree with Plan of Care  Patient;Family member/caregiver    Family Member Consulted  Daughter       Patient will benefit from skilled therapeutic intervention in order to improve the following deficits and impairments:     Visit Diagnosis: Muscle weakness (generalized)  Other abnormalities of gait and mobility  Balance problem  Pain in right hip     Problem List Patient Active Problem List   Diagnosis Date Noted  . CVA (cerebral vascular accident) (HCC) 05/30/2018  . Hip fracture, unspecified laterality, closed, initial encounter (HCC) 09/28/2017  . Hip fracture (HCC) 09/28/2017   Cammie Mcgee, PT, DPT # [id number] Nolon Bussing, SPT 07/03/2018, 5:28 PM  [facility] [address] [place], [place], [address] Phone: [phone]   Fax:  [phone]  Name: Alexander Kline. MRN: [id number] Date of Birth: [birth year]

## 2018-07-10 ENCOUNTER — Ambulatory Visit: Payer: Medicare Other | Admitting: Physical Therapy

## 2018-07-10 DIAGNOSIS — M25551 Pain in right hip: Secondary | ICD-10-CM

## 2018-07-10 DIAGNOSIS — R2689 Other abnormalities of gait and mobility: Secondary | ICD-10-CM

## 2018-07-10 DIAGNOSIS — M6281 Muscle weakness (generalized): Secondary | ICD-10-CM | POA: Diagnosis not present

## 2018-07-10 NOTE — Patient Instructions (Signed)
Access Code: [email or web address]  URL: [email or web address]  Date: 07/10/2018  Prepared by: Dorene Grebe   Exercises  Supine Transversus Abdominis Bracing with Leg Extension - 10 reps - 3 sets - 1x daily - 7x weekly  Beginner Bridge - 10 reps - 3 sets - 1x daily - 7x weekly  Alternating Single Leg Bridge - 10 reps - 3 sets - 1x daily - 7x weekly  Single Leg Bridge - 10 reps - 3 sets - 1x daily - 7x weekly  Supine Core Bicycle - 10 reps - 3 sets - 1x daily - 7x weekly  Dead Bug - 10 reps - 3 sets - 1x daily - 7x weekly  Beginner Clam - 10 reps - 3 sets - 1x daily - 7x weekly  Beginner Reverse Clamshell - 10 reps - 3 sets - 1x daily - 7x weekly  Sidelying Hip Abduction with Resistance at Thighs - 10 reps - 3 sets - 1x daily - 7x weekly  Prone Hip Extension - 10 reps - 3 sets - 1x daily - 7x weekly  Prone Quadriceps Stretch with Strap - 10 reps - 3 sets - 1x daily - 7x weekly  Modified Thomas Stretch - 10 reps - 3 sets - 1x daily - 7x weekly  Thomas Stretch on Table - 10 reps - 3 sets - 1x daily - 7x weekly

## 2018-07-11 ENCOUNTER — Encounter: Payer: Self-pay | Admitting: Physical Therapy

## 2018-07-11 NOTE — Therapy (Signed)
[facility] [address] [place], [place], [address] Phone: [phone]   Fax:  [phone]  Physical Therapy Treatment  Patient Details  Name: Alexander MeadowRaymond E Lites Jr. MRN: [id number] Date of Birth: 03/10/1941 Referring Provider: Eustaquio BoydenEJAN-SIE, S AHMED   Encounter Date: 07/10/2018  PT End of Session - 07/11/18 1308    Visit Number  3    Number of Visits  8    Date for PT Re-Evaluation  08/22/18    PT Start Time  1022    PT Stop Time  1124    PT Time Calculation (min)  62 min    Equipment Utilized During Treatment  Gait belt    Activity Tolerance  Patient tolerated treatment well    Behavior During Therapy  WFL for tasks assessed/performed       Past Medical History:  Diagnosis Date  . Hyperlipidemia   . Pre-diabetes     Past Surgical History:  Procedure Laterality Date  . HIP ARTHROPLASTY Right 09/29/2017   Procedure: ARTHROPLASTY BIPOLAR HIP (HEMIARTHROPLASTY);  Surgeon: Deeann SaintMiller, Howard, MD;  Location: [facility] ORS;  Service: Orthopedics;  Laterality: Right;    There were no vitals filed for this visit.  Subjective Assessment - 07/11/18 1306    Subjective  Pt. reports no new complaints other than continued pain in R groin/medial thigh.  Pt. reports pain is sill present, however has decreased since coming to therapy and performing stretches at home.  Pt. and daughter are interested in assessing functional ability to perform routine car maintenance while in clinic today.      Patient is accompained by:  Family member    Limitations  Standing;Walking    How long can you walk comfortably?  Inital pain, however can continue to walk as he continues to walk.    Patient Stated Goals  No discomfort with walking.    Currently in Pain?  No/denies    Pain Score  --   no complaint of pain when at rest, 1 pain level when walking.   Pain Location  Hip    Pain Orientation  Right    Pain Descriptors / Indicators  Discomfort    Pain Type  Chronic  pain    Pain Onset  More than a month ago    Pain Frequency  Intermittent          Treatment   Manual:  Supine B Distal Hamstring Stretch 4x30 sec each Supine B Proximal Hamstring Stretch 4x30 sec each Supine B Piriformis Stretch 6x30 sec each Supine B Figure-4 Stretch 4x30 sec each Supine B IR/ER Stretch 4x30 sec each Supine B Hip Adductor Stretch 4x30 sec each   There Ex:  Supine TrA contraction with leg extension x10 Supine Bridge with TrA contraction x10 Supine Deadbugs with TrA contraction x30 sec Sidelying Clamshells x10   Pt. dad difficulty with coordination of deadbugs, and were given as part of HEP to practice in next few weeks.  Pt. and daughter educated on newly given HEP and were instructed on proper standing technique from high kneeling.  Pt. performed well with most exercises and was given progression for the next few weeks.  Pt. and daughter were encouraged to reach out if any confusion were to arise with exercises over next few weeks.        PT Long Term Goals - 06/27/18 1806      PT LONG TERM GOAL #1   Title  Pt will improve BERG by at least  3 points in order to demonstrate clinically significant improvement in balance.     Baseline  8/28 Berg: 52/56    Time  8    Period  Weeks    Status  New    Target Date  08/22/18      PT LONG TERM GOAL #2   Title  Pt will decrease 5TSTS by at least 3 seconds in order to demonstrate clinically significant improvement in LE strength    Baseline  8/28 5TSTS: 24.30    Time  8    Period  Weeks    Status  New    Target Date  08/22/18      PT LONG TERM GOAL #3   Title  Pt will decrease TUG to below 14 seconds in order to demonstrate decreased fall risk    Baseline  8/28 TUG: 14.93    Time  8    Period  Weeks    Status  New    Target Date  08/22/18      PT LONG TERM GOAL #4   Title  Pt. will complete FOTO and improve to 69 to improve daily functional mobility.    Baseline  8/28 FOTO: 67    Time  8     Period  Weeks    Status  New    Target Date  08/22/18            Plan - 07/11/18 1309    Clinical Impression Statement  Pt. has made significant progress since intial evaluation.  Pt. was able to perform several of the instructed exercises today without any increase in pain, and tolerated stretching.  POC change was discussed with daugther and pt. and boh agreeable to notion that if no regression is to occur in the next couple of weeks, he will be discharged.  Pt. will be contacted prior to discharge to assess how he is feeling.  Pt. will continue with extensive HEP outlined in patient education portion in order to promote tissue extensability needed for hobby of working on cars.  Pt. was instructed on proper form of getting up from tall kneeling when changing tires/checking tire pressure.  Pt. encouraged to use a QBC to assist with standing from tall kneeling position, which pt. and daughter were agreeable with. Pt. to be reassessed in 2-3 weeks.    Clinical Presentation  Evolving    Clinical Decision Making  Moderate    Rehab Potential  Fair    PT Frequency  1x / week    PT Duration  8 weeks    PT Treatment/Interventions  Canalith Repostioning;Cryotherapy;Electrical Stimulation;Moist Heat;Ultrasound;Therapeutic exercise;Therapeutic activities;Functional mobility training;Stair training;Gait training;Balance training;Neuromuscular re-education;Manual techniques;Dry needling;Passive range of motion    PT Next Visit Plan  d/c at next follw-up.    PT Home Exercise Plan  see HEP.    Consulted and Agree with Plan of Care  Patient;Family member/caregiver    Family Member Consulted  Daughter       Patient will benefit from skilled therapeutic intervention in order to improve the following deficits and impairments:  Abnormal gait, Impaired sensation, Pain, Decreased mobility, Decreased endurance, Decreased activity tolerance, Decreased range of motion, Decreased strength, Decreased balance,  Impaired flexibility  Visit Diagnosis: Muscle weakness (generalized)  Other abnormalities of gait and mobility  Balance problem  Pain in right hip     Problem List Patient Active Problem List   Diagnosis Date Noted  . CVA (cerebral vascular accident) (HCC) 05/30/2018  . Hip fracture,  unspecified laterality, closed, initial encounter (HCC) 09/28/2017  . Hip fracture (HCC) 09/28/2017   Cammie Mcgee, PT, DPT # [id number] Nolon Bussing, SPT 07/11/2018, 1:30 PM  [facility] [address] [place], [place], [address] Phone: [phone]   Fax:  [phone]  Name: Alexander Meadow. MRN: [id number] Date of Birth: 1941-06-18

## 2018-07-17 ENCOUNTER — Encounter: Payer: Medicare Other | Admitting: Physical Therapy

## 2018-07-22 ENCOUNTER — Other Ambulatory Visit: Payer: Self-pay | Admitting: Internal Medicine

## 2018-07-22 ENCOUNTER — Ambulatory Visit: Payer: Self-pay | Admitting: Cardiovascular Disease

## 2018-07-22 ENCOUNTER — Other Ambulatory Visit: Payer: Self-pay | Admitting: Cardiovascular Disease

## 2018-07-24 ENCOUNTER — Encounter: Payer: Medicare Other | Admitting: Physical Therapy

## 2018-07-25 ENCOUNTER — Ambulatory Visit: Payer: Self-pay | Admitting: Cardiovascular Disease

## 2018-07-25 ENCOUNTER — Other Ambulatory Visit: Payer: Self-pay | Admitting: Cardiovascular Disease

## 2018-07-26 ENCOUNTER — Encounter
Admission: RE | Disposition: A | Discharge status: Home / Self Care | Payer: Self-pay | Source: Ambulatory Visit | Attending: Cardiovascular Disease

## 2018-07-26 ENCOUNTER — Ambulatory Visit
Admission: RE | Admit: 2018-07-26 | Discharge: 2018-07-26 | Disposition: A | Discharge status: Home / Self Care | Payer: Medicare Other | Source: Ambulatory Visit | Attending: Cardiovascular Disease | Admitting: Cardiovascular Disease

## 2018-07-26 ENCOUNTER — Ambulatory Visit
Admission: RE | Admit: 2018-07-26 | Discharge: 2018-07-26 | Disposition: A | Discharge status: Home / Self Care | Payer: Medicare Other | Source: Ambulatory Visit | Attending: Registered Nurse | Admitting: Registered Nurse

## 2018-07-26 DIAGNOSIS — R7303 Prediabetes: Secondary | ICD-10-CM | POA: Diagnosis not present

## 2018-07-26 DIAGNOSIS — Z882 Allergy status to sulfonamides status: Secondary | ICD-10-CM | POA: Insufficient documentation

## 2018-07-26 DIAGNOSIS — I350 Nonrheumatic aortic (valve) stenosis: Secondary | ICD-10-CM | POA: Insufficient documentation

## 2018-07-26 DIAGNOSIS — Q210 Ventricular septal defect: Secondary | ICD-10-CM | POA: Diagnosis not present

## 2018-07-26 DIAGNOSIS — E785 Hyperlipidemia, unspecified: Secondary | ICD-10-CM | POA: Diagnosis not present

## 2018-07-26 DIAGNOSIS — I472 Ventricular tachycardia: Secondary | ICD-10-CM | POA: Diagnosis not present

## 2018-07-26 DIAGNOSIS — I10 Essential (primary) hypertension: Secondary | ICD-10-CM | POA: Diagnosis not present

## 2018-07-26 DIAGNOSIS — Z8249 Family history of ischemic heart disease and other diseases of the circulatory system: Secondary | ICD-10-CM | POA: Diagnosis not present

## 2018-07-26 HISTORY — PX: TEE WITHOUT CARDIOVERSION: SHX5443

## 2018-07-26 HISTORY — DX: Essential (primary) hypertension: I10

## 2018-07-26 LAB — GLUCOSE, CAPILLARY: GLUCOSE-CAPILLARY: 101 mg/dL — AB (ref 70–99)

## 2018-07-26 SURGERY — ECHOCARDIOGRAM, TRANSESOPHAGEAL
Anesthesia: Moderate Sedation

## 2018-07-26 MED ORDER — MIDAZOLAM HCL 5 MG/5ML IJ SOLN
INTRAMUSCULAR | Status: AC
  Filled 2018-07-26: qty 5

## 2018-07-26 MED ORDER — LIDOCAINE HCL (CARDIAC) PF 100 MG/5ML IV SOSY
PREFILLED_SYRINGE | INTRAVENOUS | Status: AC
  Filled 2018-07-26: qty 5

## 2018-07-26 MED ORDER — MIDAZOLAM HCL 2 MG/2ML IJ SOLN
INTRAMUSCULAR | Status: AC | PRN
  Administered 2018-07-26: 1 mg via INTRAVENOUS
  Administered 2018-07-26: 2 mg via INTRAVENOUS

## 2018-07-26 MED ORDER — BUTAMBEN-TETRACAINE-BENZOCAINE 2-2-14 % EX AERO
INHALATION_SPRAY | CUTANEOUS | Status: AC
  Filled 2018-07-26: qty 5

## 2018-07-26 MED ORDER — FENTANYL CITRATE (PF) 100 MCG/2ML IJ SOLN
INTRAMUSCULAR | Status: AC | PRN
  Administered 2018-07-26: 50 ug via INTRAVENOUS

## 2018-07-26 MED ORDER — SODIUM CHLORIDE 0.9 % IV SOLN: INTRAVENOUS | Status: DC

## 2018-07-26 MED ORDER — FENTANYL CITRATE (PF) 100 MCG/2ML IJ SOLN
INTRAMUSCULAR | Status: AC
  Filled 2018-07-26: qty 2

## 2018-07-26 NOTE — Consult Note (Signed)
Alexander Kline. is a 77 y.o. male  [id number]  Primary Cardiologist: Alexander Kline Reason for Consultation: Severe aortic stenosis  HPI: This is a 77 year old white male with a past medical history of hypertension hyperlipidemia and prediabetes presented to the hospital with chest pain and shortness of breath.  She was diagnosed of severe aortic stenosis on echocardiogram done in the office which showed normal left ventricular systolic function and severe aortic stenosis.  She also had CTA of coronaries which showed normal coronaries with calcium score 0.   Review of Systems: No orthopnea PND or leg swelling but does have some dizziness and apparently tripped this morning.   Past Medical History:  Diagnosis Date  . Hyperlipidemia   . Hypertension   . Pre-diabetes     No medications prior to admission.     . butamben-tetracaine-benzocaine      . fentaNYL      . lidocaine (cardiac) 100 mg/84mL      . midazolam        Infusions: . sodium chloride      Allergies  Allergen Reactions  . Sulfa Antibiotics     Per pt, he gets bad headaches with sulfa drugs    Social History   Socioeconomic History  . Marital status: Married    Spouse name: Not on file  . Number of children: Not on file  . Years of education: Not on file  . Highest education level: Not on file  Occupational History  . Not on file  Social Needs  . Financial resource strain: Not on file  . Food insecurity:    Worry: Not on file    Inability: Not on file  . Transportation needs:    Medical: Not on file    Non-medical: Not on file  Tobacco Use  . Smoking status: Never Smoker  . Smokeless tobacco: Never Used  Substance and Sexual Activity  . Alcohol use: No    Alcohol/week: 0.0 standard drinks  . Drug use: No  . Sexual activity: Not Currently  Lifestyle  . Physical activity:    Days per week: Not on file    Minutes per session: Not on file  . Stress: Not on file  Relationships  . Social  connections:    Talks on phone: Not on file    Gets together: Not on file    Attends religious service: Not on file    Active member of club or organization: Not on file    Attends meetings of clubs or organizations: Not on file    Relationship status: Not on file  . Intimate partner violence:    Fear of current or ex partner: Not on file    Emotionally abused: Not on file    Physically abused: Not on file    Forced sexual activity: Not on file  Other Topics Concern  . Not on file  Social History Narrative  . Not on file    Family History  Problem Relation Age of Onset  . CAD Father   . CAD Brother     PHYSICAL EXAM: Vitals:   07/26/18 0845 07/26/18 0900  BP: 138/70 (!) 147/78  Pulse: (!) 59 (!) 59  Resp: 13 20  Temp:    SpO2: 95% 97%    No intake or output data in the 24 hours ending 07/26/18 0939  General:  Well appearing. No respiratory difficulty HEENT: normal Neck: supple. no JVD. Carotids 2+ bilat; no bruits. No lymphadenopathy  or thryomegaly appreciated. Cor: PMI nondisplaced. Regular rate & rhythm. No rubs, gallops or murmurs. Lungs: clear Abdomen: soft, nontender, nondistended. No hepatosplenomegaly. No bruits or masses. Good bowel sounds. Extremities: no cyanosis, clubbing, rash, edema Neuro: alert & oriented x 3, cranial nerves grossly intact. moves all 4 extremities w/o difficulty. Affect pleasant.  ECG: Sinus rhythm with no acute changes but the monitor during cardiac catheterization today showed nonsustained ventricular tachycardia about 10-15 beats.  Results for orders placed or performed during the hospital encounter of 07/26/18 (from the past 24 hour(s))  Glucose, capillary     Status: Abnormal   Collection Time: 07/26/18  7:26 AM  Result Value Ref Range   Glucose-Capillary 101 (H) 70 - 99 mg/dL   No results found.   ASSESSMENT AND PLAN: Status post left and right heart catheterization because of severe aortic stenosis.  Right heart pressures  were normal with PA systolic pressure about 33 mmHg.  I was unable to cross the aortic valve as there is severe aortic stenosis.  Coronaries were normal.  Patient had nonsustained run of V. tach and was given 2 g of magnesium as well as bolus of 150 mg of amiodarone.  Will observe the patient for few hours and may consider admission if continues to have nonsustained VT.  But will place the patient on 400 mg of amiodarone p.o. twice daily and discharge the patient with follow-up on office Monday at 10 AM.  Alexander Kline A

## 2018-07-26 NOTE — Progress Notes (Signed)
*  PRELIMINARY RESULTS* Echocardiogram Echocardiogram Transesophageal has been performed.  Alexander Kline 07/26/2018, 8:29 AM

## 2018-07-31 ENCOUNTER — Encounter: Payer: Medicare Other | Admitting: Physical Therapy

## 2018-08-07 ENCOUNTER — Encounter: Payer: Medicare Other | Admitting: Physical Therapy

## 2018-08-22 DIAGNOSIS — G319 Degenerative disease of nervous system, unspecified: Secondary | ICD-10-CM | POA: Insufficient documentation

## 2019-05-08 ENCOUNTER — Other Ambulatory Visit: Payer: Self-pay | Admitting: Neurology

## 2019-05-08 DIAGNOSIS — R2689 Other abnormalities of gait and mobility: Secondary | ICD-10-CM

## 2019-05-18 ENCOUNTER — Ambulatory Visit
Admission: RE | Admit: 2019-05-18 | Discharge: 2019-05-18 | Disposition: A | Discharge status: Home / Self Care | Payer: Medicare Other | Source: Ambulatory Visit | Attending: Neurology | Admitting: Neurology

## 2019-05-18 ENCOUNTER — Other Ambulatory Visit: Payer: Self-pay

## 2019-05-18 DIAGNOSIS — R2689 Other abnormalities of gait and mobility: Secondary | ICD-10-CM | POA: Diagnosis present

## 2019-07-27 IMAGING — CT CT HEAD W/O CM
3 series · 15 of 47 positions shown, 18 images · non-contrast
Comparison: None.

CLINICAL DATA: 77-year-old male with a history of dizziness

EXAM:
CT HEAD WITHOUT CONTRAST
TECHNIQUE: Contiguous axial images were obtained from the base of the skull
through the vertex without intravenous contrast.

[Series 2: head wo · axial · 0.47mm/px · z∈[-119,+6]mm · 9 of 31 slices shown, 12 images]
[im 3/31  brain]
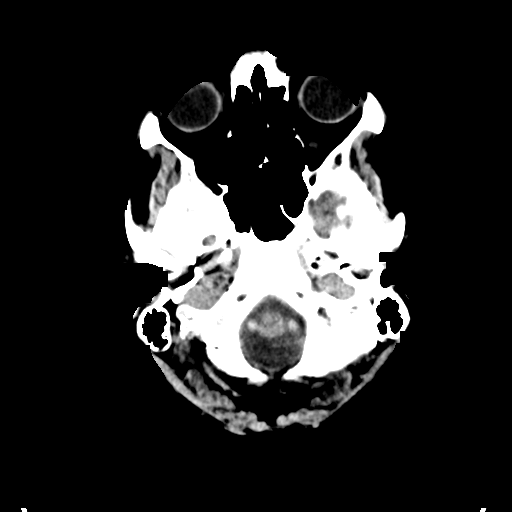
[im 3/31  bone]
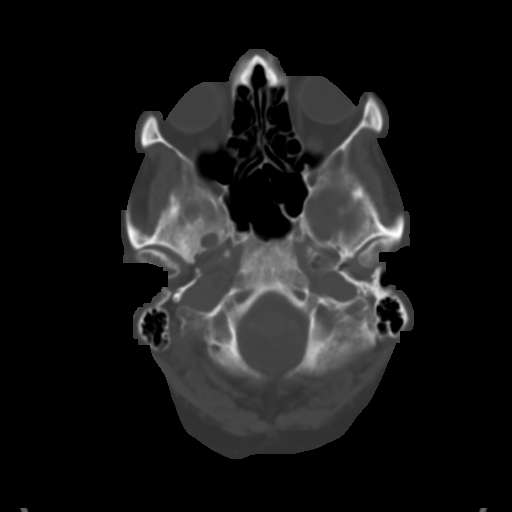
[im 6/31  brain]
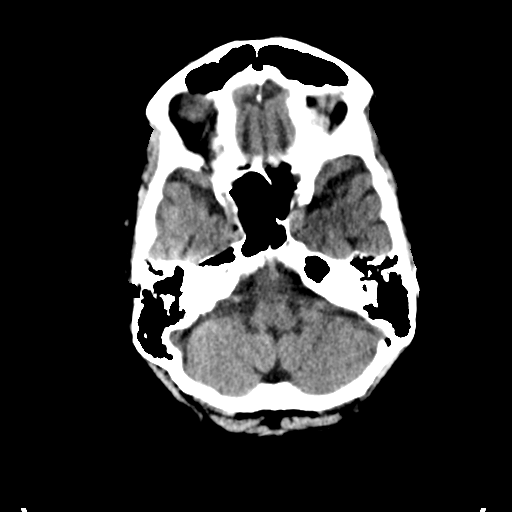
[im 9/31  brain]
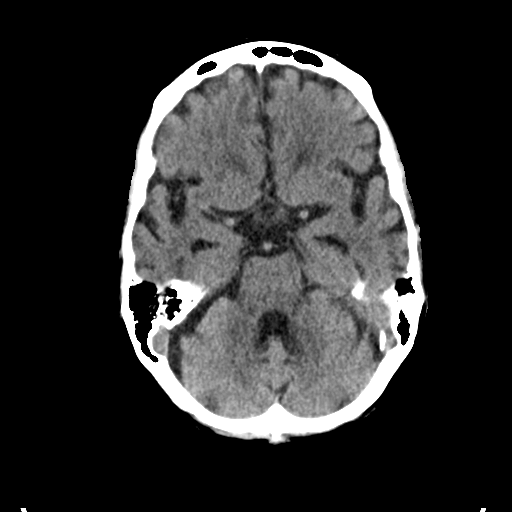
[im 12/31  brain]
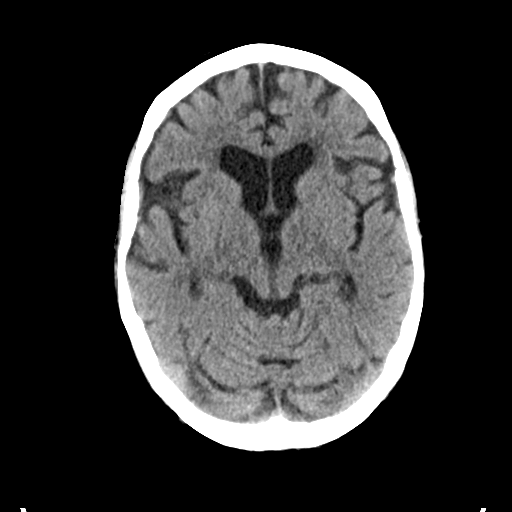
[im 16/31  brain]
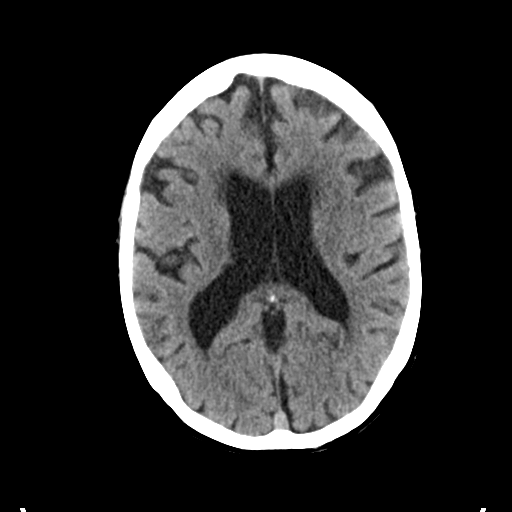
[im 16/31  bone]
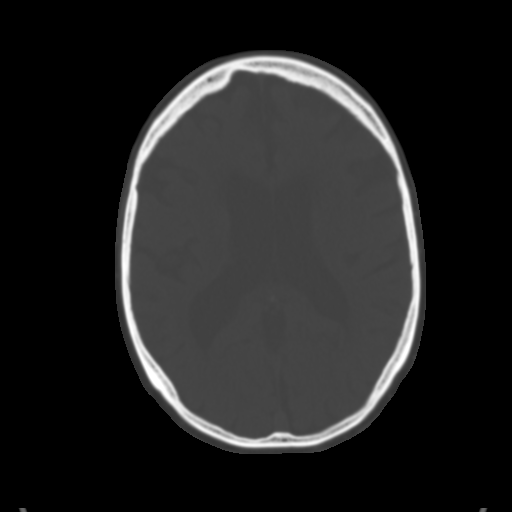
[im 19/31  brain]
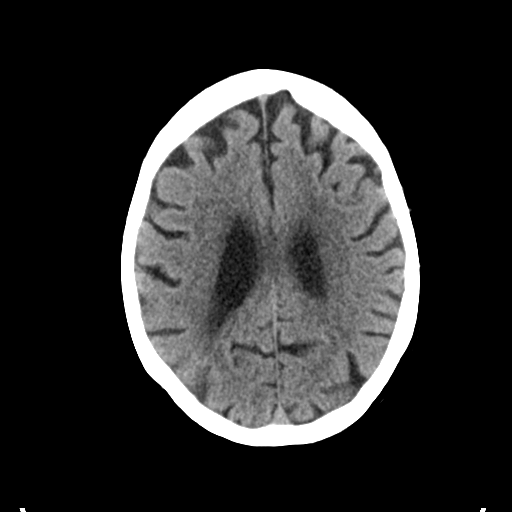
[im 22/31  brain]
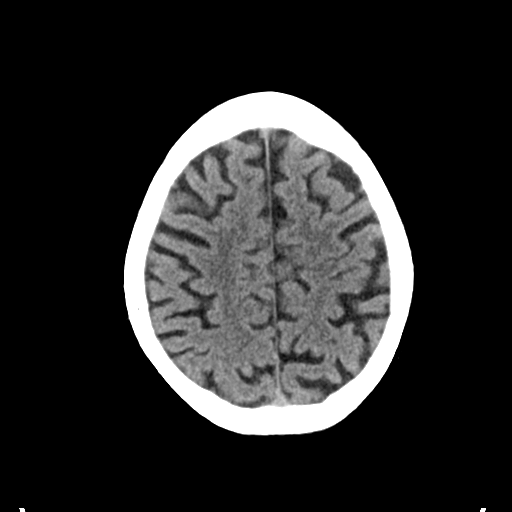
[im 25/31  brain]
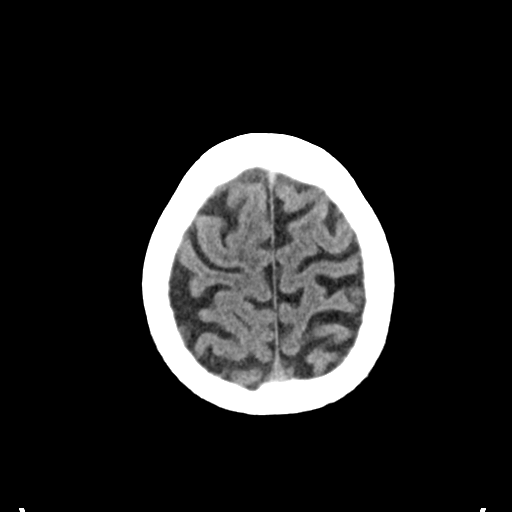
[im 28/31  brain]
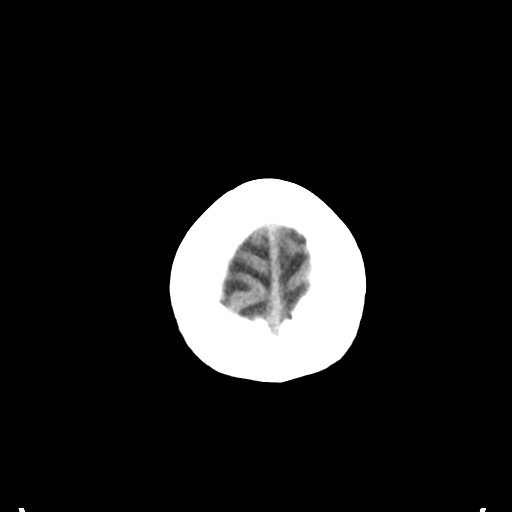
[im 28/31  bone]
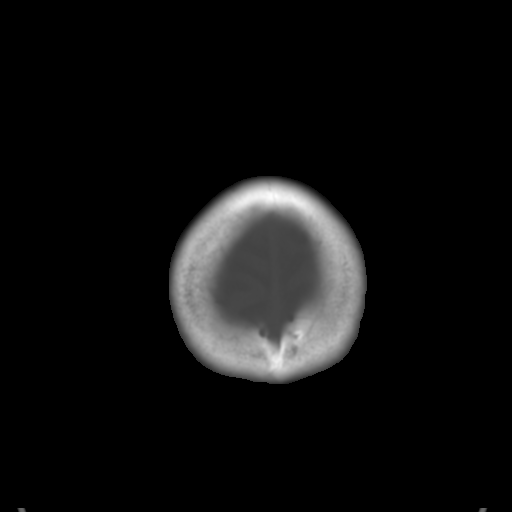

[Series 4: coronal soft tissue · coronal · 0.31mm/px · 3 of 62 slices shown]
[im 21/62  brain]
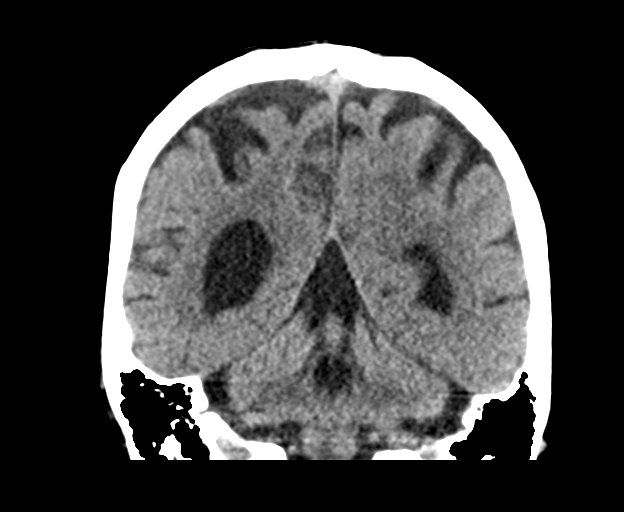
[im 28/62  brain]
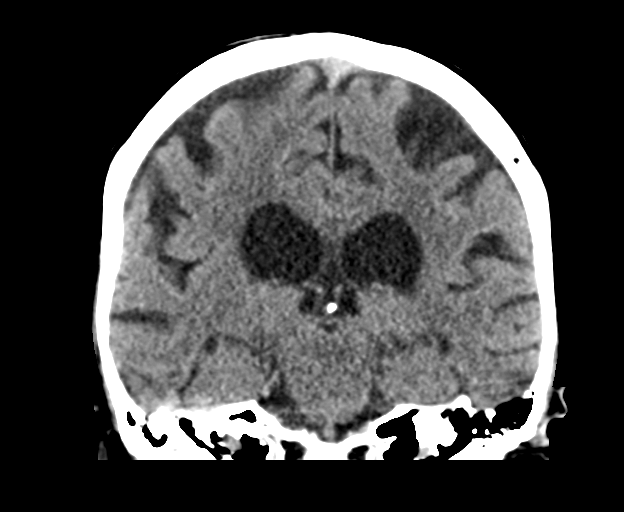
[im 34/62  brain]
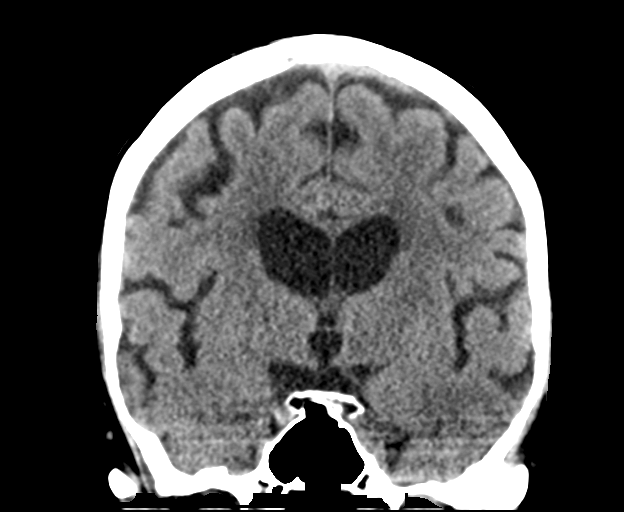

[Series 5: sagittal soft tissue · sagittal · 0.31mm/px · 3 of 49 slices shown]
[im 17/49  brain]
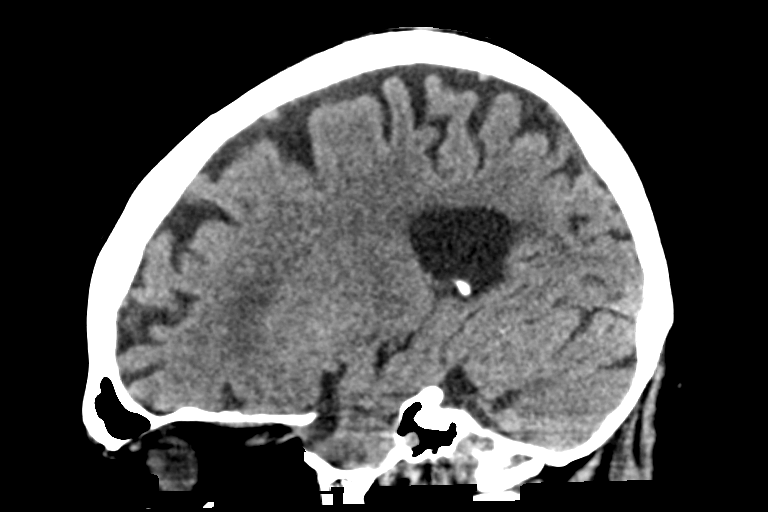
[im 25/49  brain]
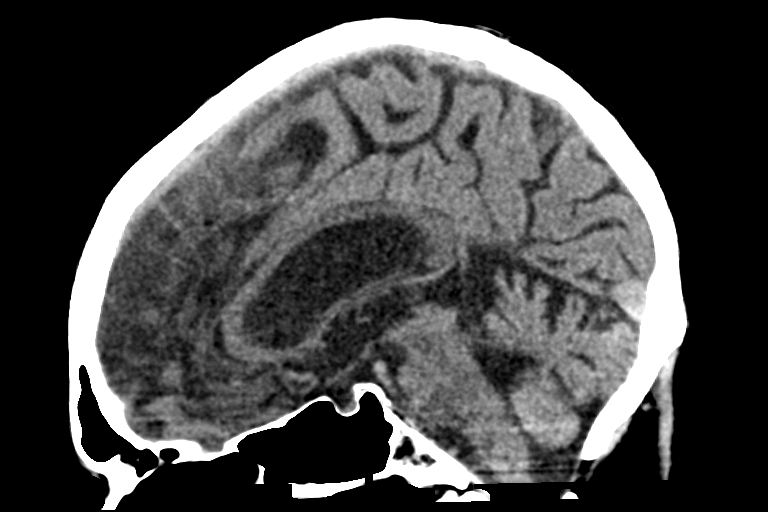
[im 33/49  brain]
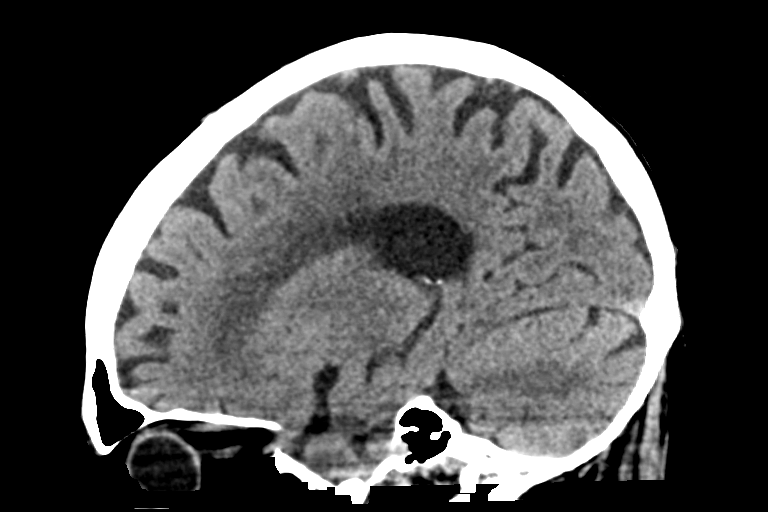

[15 of 47 positions shown; findings below may reference images not displayed]

FINDINGS: Brain: No acute intracranial hemorrhage. No midline shift or mass
effect. Mild volume loss. Unremarkable configuration the ventricles.
Gray-white differentiation maintained. Patchy hypodensity in the
periventricular white matter.

Vascular: No significant calcifications.

Skull: No acute bony abnormality.

Sinuses/Orbits: No acute finding.

Other: None.
IMPRESSION: Negative for acute intracranial abnormality.

Mild volume loss and periventricular white matter disease.

## 2020-01-09 ENCOUNTER — Other Ambulatory Visit
Admission: RE | Admit: 2020-01-09 | Discharge: 2020-01-09 | Disposition: A | Discharge status: Home / Self Care | Payer: Medicare Other | Source: Ambulatory Visit | Attending: Internal Medicine | Admitting: Internal Medicine

## 2020-01-09 ENCOUNTER — Other Ambulatory Visit: Payer: Self-pay

## 2020-01-09 DIAGNOSIS — Z20822 Contact with and (suspected) exposure to covid-19: Secondary | ICD-10-CM | POA: Insufficient documentation

## 2020-01-09 DIAGNOSIS — Z01812 Encounter for preprocedural laboratory examination: Secondary | ICD-10-CM | POA: Diagnosis present

## 2020-01-09 LAB — SARS CORONAVIRUS 2 (TAT 6-24 HRS): SARS Coronavirus 2: NEGATIVE

## 2020-01-12 ENCOUNTER — Other Ambulatory Visit: Payer: Medicare Other

## 2020-01-13 ENCOUNTER — Encounter: Payer: Self-pay | Admitting: Internal Medicine

## 2020-01-14 ENCOUNTER — Ambulatory Visit: Payer: Medicare Other | Admitting: Certified Registered"

## 2020-01-14 ENCOUNTER — Other Ambulatory Visit: Payer: Self-pay

## 2020-01-14 ENCOUNTER — Encounter
Admission: RE | Disposition: A | Discharge status: Home / Self Care | Payer: Self-pay | Source: Ambulatory Visit | Attending: Internal Medicine

## 2020-01-14 ENCOUNTER — Ambulatory Visit
Admission: RE | Admit: 2020-01-14 | Discharge: 2020-01-14 | Disposition: A | Discharge status: Home / Self Care | Payer: Medicare Other | Source: Ambulatory Visit | Attending: Internal Medicine | Admitting: Internal Medicine

## 2020-01-14 ENCOUNTER — Encounter: Payer: Self-pay | Admitting: Internal Medicine

## 2020-01-14 DIAGNOSIS — I10 Essential (primary) hypertension: Secondary | ICD-10-CM | POA: Diagnosis not present

## 2020-01-14 DIAGNOSIS — K5730 Diverticulosis of large intestine without perforation or abscess without bleeding: Secondary | ICD-10-CM | POA: Insufficient documentation

## 2020-01-14 DIAGNOSIS — D509 Iron deficiency anemia, unspecified: Secondary | ICD-10-CM | POA: Diagnosis not present

## 2020-01-14 DIAGNOSIS — Z7982 Long term (current) use of aspirin: Secondary | ICD-10-CM | POA: Diagnosis not present

## 2020-01-14 DIAGNOSIS — E785 Hyperlipidemia, unspecified: Secondary | ICD-10-CM | POA: Diagnosis not present

## 2020-01-14 DIAGNOSIS — Z7901 Long term (current) use of anticoagulants: Secondary | ICD-10-CM | POA: Diagnosis not present

## 2020-01-14 DIAGNOSIS — K219 Gastro-esophageal reflux disease without esophagitis: Secondary | ICD-10-CM | POA: Diagnosis not present

## 2020-01-14 DIAGNOSIS — Z7984 Long term (current) use of oral hypoglycemic drugs: Secondary | ICD-10-CM | POA: Diagnosis not present

## 2020-01-14 DIAGNOSIS — Z8673 Personal history of transient ischemic attack (TIA), and cerebral infarction without residual deficits: Secondary | ICD-10-CM | POA: Insufficient documentation

## 2020-01-14 DIAGNOSIS — K921 Melena: Secondary | ICD-10-CM | POA: Diagnosis not present

## 2020-01-14 DIAGNOSIS — Z79899 Other long term (current) drug therapy: Secondary | ICD-10-CM | POA: Insufficient documentation

## 2020-01-14 DIAGNOSIS — K641 Second degree hemorrhoids: Secondary | ICD-10-CM | POA: Insufficient documentation

## 2020-01-14 DIAGNOSIS — Z791 Long term (current) use of non-steroidal anti-inflammatories (NSAID): Secondary | ICD-10-CM | POA: Insufficient documentation

## 2020-01-14 DIAGNOSIS — Z882 Allergy status to sulfonamides status: Secondary | ICD-10-CM | POA: Insufficient documentation

## 2020-01-14 DIAGNOSIS — R7303 Prediabetes: Secondary | ICD-10-CM | POA: Insufficient documentation

## 2020-01-14 HISTORY — PX: COLONOSCOPY WITH PROPOFOL: SHX5780

## 2020-01-14 HISTORY — DX: Cerebral infarction, unspecified: I63.9

## 2020-01-14 HISTORY — PX: ESOPHAGOGASTRODUODENOSCOPY (EGD) WITH PROPOFOL: SHX5813

## 2020-01-14 SURGERY — COLONOSCOPY WITH PROPOFOL
Anesthesia: General

## 2020-01-14 MED ORDER — SODIUM CHLORIDE 0.9 % IV SOLN: INTRAVENOUS | Status: DC

## 2020-01-14 MED ORDER — LIDOCAINE HCL (CARDIAC) PF 100 MG/5ML IV SOSY
PREFILLED_SYRINGE | INTRAVENOUS | Status: DC | PRN
  Administered 2020-01-14: 100 mg via INTRATRACHEAL

## 2020-01-14 MED ORDER — PROPOFOL 10 MG/ML IV BOLUS
INTRAVENOUS | Status: DC | PRN
  Administered 2020-01-14: 50 mg via INTRAVENOUS

## 2020-01-14 MED ORDER — GLYCOPYRROLATE 0.2 MG/ML IJ SOLN
INTRAMUSCULAR | Status: DC | PRN
  Administered 2020-01-14: .2 mg via INTRAVENOUS

## 2020-01-14 MED ORDER — PROPOFOL 500 MG/50ML IV EMUL
INTRAVENOUS | Status: DC | PRN
  Administered 2020-01-14: 165 ug/kg/min via INTRAVENOUS

## 2020-01-14 NOTE — Anesthesia Preprocedure Evaluation (Signed)
Anesthesia Evaluation  Patient identified by MRN, date of birth, ID band Patient awake    Reviewed: Allergy & Precautions, H&P , NPO status , Patient's Chart, lab work & pertinent test results, reviewed documented beta blocker date and time   History of Anesthesia Complications Negative for: history of anesthetic complications  Airway Mallampati: I  TM Distance: >3 FB Neck ROM: full    Dental  (+) Dental Advidsory Given, Caps, Teeth Intact   Pulmonary neg pulmonary ROS,           Cardiovascular Exercise Tolerance: Good hypertension, negative cardio ROS       Neuro/Psych neg Seizures CVA, No Residual Symptoms negative psych ROS   GI/Hepatic negative GI ROS, Neg liver ROS,   Endo/Other  diabetes (Pre-diabetic)  Renal/GU negative Renal ROS  negative genitourinary   Musculoskeletal   Abdominal   Peds  Hematology negative hematology ROS (+)   Anesthesia Other Findings Past Medical History: No date: Hyperlipidemia   Reproductive/Obstetrics negative OB ROS                             Anesthesia Physical  Anesthesia Plan  ASA: II  Anesthesia Plan: General   Post-op Pain Management:    Induction: Intravenous  PONV Risk Score and Plan: 1 and Propofol infusion and TIVA  Airway Management Planned: Natural Airway and Nasal Cannula  Additional Equipment:   Intra-op Plan:   Post-operative Plan:   Informed Consent: I have reviewed the patients History and Physical, chart, labs and discussed the procedure including the risks, benefits and alternatives for the proposed anesthesia with the patient or authorized representative who has indicated his/her understanding and acceptance.     Dental Advisory Given  Plan Discussed with: Anesthesiologist, CRNA and Surgeon  Anesthesia Plan Comments:         Anesthesia Quick Evaluation

## 2020-01-14 NOTE — H&P (Signed)
Outpatient short stay form Pre-procedure 01/14/2020 8:46 AM Alexander Kline K. Alice Reichert, M.D.  Primary Physician: Jodi Marble, M.D.  Reason for visit:  GERD, Hemoccult positive stool  History of present illness:  Patient has hx of GERD but denies dysphagia, abdominal pain, weight loss. Patient has hemoccult positive stool. Patient denies change in bowel habits, rectal bleeding, weight loss or abdominal pain.      Current Facility-Administered Medications:  .  0.9 %  sodium chloride infusion, , Intravenous, Continuous, Bladen, Benay Pike, MD, Last Rate: 20 mL/hr at 01/14/20 0835, New Bag at 01/14/20 0835  Medications Prior to Admission  Medication Sig Dispense Refill Last Dose  . acetaminophen (TYLENOL) 325 MG tablet Take 2 tablets (650 mg total) by mouth every 6 (six) hours as needed for mild pain (or Fever >/= 101). 15 tablet 0 Past Week at Unknown time  . apixaban (ELIQUIS) 5 MG TABS tablet Take 5 mg by mouth 2 (two) times daily.   01/10/2020 at Unknown time  . atorvastatin (LIPITOR) 80 MG tablet Take 1 tablet (80 mg total) by mouth daily. 30 tablet 1 Past Week at Unknown time  . enalapril (VASOTEC) 2.5 MG tablet Take 2.5 mg by mouth daily.   Past Week at Unknown time  . ferrous sulfate 325 (65 FE) MG tablet Take 1 tablet (325 mg total) by mouth 2 (two) times daily with a meal. 100 tablet 2 Past Week at Unknown time  . metFORMIN (GLUCOPHAGE-XR) 500 MG 24 hr tablet Take 500 mg by mouth daily.   Past Week at Unknown time  . methocarbamol (ROBAXIN) 500 MG tablet Take 500 mg by mouth 2 (two) times daily.  1 Past Week at Unknown time  . omeprazole (PRILOSEC) 20 MG capsule Take 20 mg by mouth daily.   Past Week at Unknown time  . amLODipine (NORVASC) 10 MG tablet Take 1 tablet (10 mg total) by mouth daily. (Patient not taking: Reported on 07/26/2018) 30 tablet 1 Not Taking at Unknown time  . aspirin 81 MG chewable tablet Chew 1 tablet (81 mg total) by mouth daily. (Patient not taking: Reported on  01/14/2020) 30 tablet 1 Not Taking at Unknown time  . clopidogrel (PLAVIX) 75 MG tablet Take 1 tablet (75 mg total) by mouth daily. (Patient not taking: Reported on 01/14/2020) 30 tablet 1 Not Taking at Unknown time  . meloxicam (MOBIC) 15 MG tablet Take 15 mg by mouth daily.  3 Not Taking at Unknown time  . pantoprazole (PROTONIX) 40 MG tablet Take 1 tablet (40 mg total) by mouth daily. (Patient not taking: Reported on 05/30/2018) 30 tablet 1      Allergies  Allergen Reactions  . Sulfa Antibiotics     Per pt, he gets bad headaches with sulfa drugs     Past Medical History:  Diagnosis Date  . Hyperlipidemia   . Hypertension   . Pre-diabetes   . Stroke [facility])     Review of systems:  Otherwise negative.    Physical Exam  Gen: Alert, oriented. Appears stated age.  HEENT: [place]/AT. PERRLA. Lungs: CTA, no wheezes. CV: RR nl S1, S2. Abd: soft, benign, no masses. BS+ Ext: No edema. Pulses 2+    Planned procedures: Proceed with EGD and colonoscopy. The patient understands the nature of the planned procedure, indications, risks, alternatives and potential complications including but not limited to bleeding, infection, perforation, damage to internal organs and possible oversedation/side effects from anesthesia. The patient agrees and gives consent to proceed.  Please refer  to procedure notes for findings, recommendations and patient disposition/instructions.     Alexander Kline K. Norma Fredrickson, M.D. Gastroenterology 01/14/2020  8:46 AM

## 2020-01-14 NOTE — Anesthesia Postprocedure Evaluation (Signed)
Anesthesia Post Note  Patient: Alexander Kline.  Procedure(s) Performed: COLONOSCOPY WITH PROPOFOL (N/A ) ESOPHAGOGASTRODUODENOSCOPY (EGD) WITH PROPOFOL (N/A )  Patient location during evaluation: Endoscopy Anesthesia Type: General Level of consciousness: awake and alert Pain management: pain level controlled Vital Signs Assessment: post-procedure vital signs reviewed and stable Respiratory status: spontaneous breathing, nonlabored ventilation, respiratory function stable and patient connected to nasal cannula oxygen Cardiovascular status: blood pressure returned to baseline and stable Postop Assessment: no apparent nausea or vomiting Anesthetic complications: no     Last Vitals:  Vitals:   01/14/20 0817 01/14/20 0914  BP: (!) 155/78 109/66  Pulse: 89   Resp: 16   Temp: (!) 35.8 C 36.6 C  SpO2: 98%     Last Pain:  Vitals:   01/14/20 0944  TempSrc:   PainSc: 0-No pain                 Lenard Simmer

## 2020-01-14 NOTE — Op Note (Addendum)
[facility] Gastroenterology Patient Name: Alexander Kline Procedure Date: 01/14/2020 8:53 AM MRN: [id number] Account #: [id number] Date of Birth: 1941/02/27 Admit Type: Outpatient Age: 79 Room: [facility] ENDO ROOM 3 Gender: Male Note Status: Supervisor Override Procedure:             Colonoscopy Indications:           Heme positive stool, Iron deficiency anemia Providers:             Boykin Nearing. Norma Fredrickson MD, MD Referring MD:          Silas Flood. Ellsworth Lennox, MD (Referring MD) Medicines:             Propofol per Anesthesia Complications:         No immediate complications. Procedure:             Pre-Anesthesia Assessment:                        - The risks and benefits of the procedure and the                         sedation options and risks were discussed with the                         patient. All questions were answered and informed                         consent was obtained.                        - Patient identification and proposed procedure were                         verified prior to the procedure by the nurse. The                         procedure was verified in the procedure room.                        - ASA Grade Assessment: III - A patient with severe                         systemic disease.                        - After reviewing the risks and benefits, the patient                         was deemed in satisfactory condition to undergo the                         procedure.                        After obtaining informed consent, the colonoscope was                         passed under direct vision. Throughout the procedure,                         the patient's  blood pressure, pulse, and oxygen                         saturations were monitored continuously. The                         Colonoscope was introduced through the anus and                         advanced to the the cecum, identified by appendiceal                         orifice and  ileocecal valve. The colonoscopy was                         performed without difficulty. The patient tolerated                         the procedure well. The quality of the bowel                         preparation was good. The ileocecal valve, appendiceal                         orifice, and rectum were photographed. Findings:      The perianal and digital rectal examinations were normal. Pertinent       negatives include normal sphincter tone and no palpable rectal lesions.      Multiple small and large-mouthed diverticula were found in the sigmoid       colon.      Non-bleeding internal hemorrhoids were found during retroflexion. The       hemorrhoids were Grade II (internal hemorrhoids that prolapse but reduce       spontaneously).      The exam was otherwise without abnormality. Impression:            - Diverticulosis in the sigmoid colon.                        - Non-bleeding internal hemorrhoids.                        - The examination was otherwise normal.                        - No specimens collected. Recommendation:        - Patient has a contact number available for                         emergencies. The signs and symptoms of potential                         delayed complications were discussed with the patient.                         Return to normal activities tomorrow. Written                         discharge instructions were provided to the patient.                        -  Resume previous diet.                        - Continue present medications.                        - No repeat colonoscopy due to current age (24 years                         or older) and the absence of advanced adenomas.                        - You do NOT require further colon cancer screening                         measures (Annual stool testing (i.e. hemoccult, FIT,                         cologuard), sigmoidoscopy, colonoscopy or CT                         colonography). You  should share this recommendation                         with your Primary Care provider.                        - The findings and recommendations were discussed with                         the patient. Procedure Code(s):     --- Professional ---                        [phone], Colonoscopy, flexible; diagnostic, including                         collection of specimen(s) by brushing or washing, when                         performed (separate procedure) Diagnosis Code(s):     --- Professional ---                        K57.30, Diverticulosis of large intestine without                         perforation or abscess without bleeding                        R19.5, Other fecal abnormalities                        K64.1, Second degree hemorrhoids CPT copyright 2019 American Medical Association. All rights reserved. The codes documented in this report are preliminary and upon coder review may  be revised to meet current compliance requirements. Efrain Sella MD, MD 01/14/2020 9:13:50 AM This report has been signed electronically. Number of Addenda: 0 Note Initiated On: 01/14/2020 8:53 AM Scope Withdrawal Time: 0 hours 4 minutes 59 seconds  Total Procedure Duration: 0 hours 7 minutes 21 seconds  Estimated Blood Loss:  Estimated  blood loss: none.      [facility]

## 2020-01-14 NOTE — Op Note (Addendum)
[facility] Gastroenterology Patient Name: Alexander Kline Procedure Date: 01/14/2020 8:53 AM MRN: [id number] Account #: [id number] Date of Birth: 03-14-1941 Admit Type: Outpatient Age: 79 Room: [facility] ENDO ROOM 3 Gender: Male Note Status: Supervisor Override Procedure:             Upper GI endoscopy Indications:           Iron deficiency anemia, Gastro-esophageal reflux                         disease Providers:             Boykin Nearing. Norma Fredrickson MD, MD Referring MD:          Silas Flood. Ellsworth Lennox, MD (Referring MD) Medicines:             Propofol per Anesthesia Complications:         No immediate complications. Procedure:             Pre-Anesthesia Assessment:                        - The risks and benefits of the procedure and the                         sedation options and risks were discussed with the                         patient. All questions were answered and informed                         consent was obtained.                        - Patient identification and proposed procedure were                         verified prior to the procedure by the nurse. The                         procedure was verified in the procedure room.                        - ASA Grade Assessment: III - A patient with severe                         systemic disease.                        - After reviewing the risks and benefits, the patient                         was deemed in satisfactory condition to undergo the                         procedure.                        After obtaining informed consent, the endoscope was                         passed under direct vision. Throughout the procedure,  the patient's blood pressure, pulse, and oxygen                         saturations were monitored continuously. The Endoscope                         was introduced through the mouth, and advanced to the                         third part of duodenum. The upper GI  endoscopy was                         accomplished without difficulty. The patient tolerated                         the procedure well. Findings:      The esophagus was normal.      The stomach was normal.      The examined duodenum was normal. Impression:            - Normal esophagus.                        - Normal stomach.                        - Normal examined duodenum.                        - No specimens collected. Recommendation:        - Proceed with colonoscopy Procedure Code(s):     --- Professional ---                        [phone], Esophagogastroduodenoscopy, flexible,                         transoral; diagnostic, including collection of                         specimen(s) by brushing or washing, when performed                         (separate procedure) Diagnosis Code(s):     --- Professional ---                        K21.9, Gastro-esophageal reflux disease without                         esophagitis CPT copyright 2019 American Medical Association. All rights reserved. The codes documented in this report are preliminary and upon coder review may  be revised to meet current compliance requirements. Efrain Sella MD, MD 01/14/2020 9:02:21 AM This report has been signed electronically. Number of Addenda: 0 Note Initiated On: 01/14/2020 8:53 AM Estimated Blood Loss:  Estimated blood loss: none.      [facility]

## 2020-01-14 NOTE — Transfer of Care (Signed)
Immediate Anesthesia Transfer of Care Note  Patient: Alexander Kline.  Procedure(s) Performed: COLONOSCOPY WITH PROPOFOL (N/A ) ESOPHAGOGASTRODUODENOSCOPY (EGD) WITH PROPOFOL (N/A )  Patient Location: Endoscopy Unit  Anesthesia Type:General  Level of Consciousness: drowsy and responds to stimulation  Airway & Oxygen Therapy: Patient Spontanous Breathing and Patient connected to face mask oxygen  Post-op Assessment: Report given to RN and Post -op Vital signs reviewed and stable  Post vital signs: Reviewed and stable  Last Vitals:  Vitals Value Taken Time  BP 109/66 01/14/20 0914  Temp    Pulse 66 01/14/20 0915  Resp 15 01/14/20 0915  SpO2 100 % 01/14/20 0915  Vitals shown include unvalidated device data.  Last Pain:  Vitals:   01/14/20 0817  PainSc: 0-No pain         Complications: No apparent anesthesia complications

## 2020-01-14 NOTE — Interval H&P Note (Signed)
History and Physical Interval Note:  01/14/2020 8:48 AM  Alexander Kline.  has presented today for surgery, with the diagnosis of POSITIVE HEMOCCULT.  The various methods of treatment have been discussed with the patient and family. After consideration of risks, benefits and other options for treatment, the patient has consented to  Procedure(s): COLONOSCOPY WITH PROPOFOL (N/A) ESOPHAGOGASTRODUODENOSCOPY (EGD) WITH PROPOFOL (N/A) as a surgical intervention.  The patient's history has been reviewed, patient examined, no change in status, stable for surgery.  I have reviewed the patient's chart and labs.  Questions were answered to the patient's satisfaction.     [place], [place]

## 2020-01-15 ENCOUNTER — Encounter: Payer: Self-pay | Admitting: *Deleted

## 2020-05-24 ENCOUNTER — Other Ambulatory Visit: Payer: Self-pay | Admitting: Nephrology

## 2020-05-24 DIAGNOSIS — N1832 Chronic kidney disease, stage 3b: Secondary | ICD-10-CM

## 2020-05-24 DIAGNOSIS — E1122 Type 2 diabetes mellitus with diabetic chronic kidney disease: Secondary | ICD-10-CM

## 2020-06-01 ENCOUNTER — Ambulatory Visit
Admission: RE | Admit: 2020-06-01 | Discharge: 2020-06-01 | Disposition: A | Discharge status: Home / Self Care | Payer: Medicare Other | Source: Ambulatory Visit | Attending: Nephrology | Admitting: Nephrology

## 2020-06-01 ENCOUNTER — Other Ambulatory Visit: Payer: Self-pay

## 2020-06-01 DIAGNOSIS — N182 Chronic kidney disease, stage 2 (mild): Secondary | ICD-10-CM | POA: Diagnosis present

## 2020-06-01 DIAGNOSIS — N1832 Chronic kidney disease, stage 3b: Secondary | ICD-10-CM | POA: Insufficient documentation

## 2020-06-01 DIAGNOSIS — E1122 Type 2 diabetes mellitus with diabetic chronic kidney disease: Secondary | ICD-10-CM | POA: Insufficient documentation

## 2020-06-22 DIAGNOSIS — I493 Ventricular premature depolarization: Secondary | ICD-10-CM | POA: Insufficient documentation

## 2020-06-22 DIAGNOSIS — R002 Palpitations: Secondary | ICD-10-CM | POA: Insufficient documentation

## 2020-06-22 DIAGNOSIS — R001 Bradycardia, unspecified: Secondary | ICD-10-CM | POA: Insufficient documentation

## 2020-06-22 DIAGNOSIS — R42 Dizziness and giddiness: Secondary | ICD-10-CM | POA: Insufficient documentation

## 2020-06-22 NOTE — Progress Notes (Signed)
[facility]  [address] [place], [place] [address] Phone: [phone]  Fax: [phone]   Clinic Day:  06/23/2020  Referring physician: Anthonette Legato, MD  Chief Complaint: Alexander Bearman. is a 79 y.o. male with stage IIIb chronic kidney disease and an abnormal SPEP who is referred in consultation by Dr. Holley Raring for assessment and management.   HPI: The patient was seen in consultation by Dr. Holley Raring on 05/24/2020 for worsening stage IIIb chronic kidney disease.  He was noted to have a history of prediabetes, hypertension, CVA and hyperlipidemia.  Creatinine on 04/01/2020 was 1.54 (CrCl 42 ml/minute).  Etiology was likely felt secondary to age, hypertension and glucose intolerance.  Work-up revealed a hematocrit of 40.2, hemoglobin 13.1, platelets 214,000, WBC 9,600 ([place] 5376). Creatinine was 1.54 (CrCl 42 ml/min). SPEP revealed one or more serum protein fractions outside the normal ranges. There were no abnormal protein bands apparent. UPEP revealed a faint albumin band but no abnormal protein bands (Bence-Jones proteinuria). ANA was negative.  Renal ultrasound on 06/01/2020 revealed unremarkable kidneys and urinary bladder.  He has an enlarged prostate.  The patient has a history of cerebrovascular accident in 2019. EGD on 01/14/2020 was normal.  Colonoscopy on 01/14/2020 revealed sigmoid diverticulosis and non bleeding hemorrhoids.  Symptomatically, he is sleepy and has no energy. He has a runny nose due to allergies. He has urinary retention, though he is not on medication and does not see a urologist. The patient denies fevers, sweats, headaches, infection, changes in vision, runny nose, sore throat, cough, shortness of breath, chest pain, palpitations, leg swelling, nausea, vomiting, diarrhea, reflux, bone or joint symptoms, skin changes, numbness, weakness, balance or coordination problem, and bleeding of any kind.  The patient states that his  blood pressure has always been low and that he is not taking any blood pressure medications at this time. His blood pressure today is 142/78. The patient is going to be having a pacemaker placed because his pulse has been running in the 30s. His cardiologist is Dr. Chancy Milroy.  His mother had gastric cancer at 10.   Past Medical History:  Diagnosis Date  . Hyperlipidemia   . Hypertension   . Pre-diabetes   . Stroke [facility])     Past Surgical History:  Procedure Laterality Date  . COLONOSCOPY WITH PROPOFOL N/A 01/14/2020   Procedure: COLONOSCOPY WITH PROPOFOL;  Surgeon: Toledo, Benay Pike, MD;  Location: [facility] ENDOSCOPY;  Service: Gastroenterology;  Laterality: N/A;  . ESOPHAGOGASTRODUODENOSCOPY (EGD) WITH PROPOFOL N/A 01/14/2020   Procedure: ESOPHAGOGASTRODUODENOSCOPY (EGD) WITH PROPOFOL;  Surgeon: Toledo, Benay Pike, MD;  Location: [facility] ENDOSCOPY;  Service: Gastroenterology;  Laterality: N/A;  . HIP ARTHROPLASTY Right 09/29/2017   Procedure: ARTHROPLASTY BIPOLAR HIP (HEMIARTHROPLASTY);  Surgeon: Earnestine Leys, MD;  Location: [facility] ORS;  Service: Orthopedics;  Laterality: Right;  . JOINT REPLACEMENT    . TEE WITHOUT CARDIOVERSION N/A 07/26/2018   Procedure: TRANSESOPHAGEAL ECHOCARDIOGRAM (TEE);  Surgeon: Dionisio David, MD;  Location: [facility] ORS;  Service: Cardiovascular;  Laterality: N/A;    Family History  Problem Relation Age of Onset  . CAD Father   . CAD Brother     Social History:  reports that he has never smoked. He has never used smokeless tobacco. He reports that he does not drink alcohol and does not use drugs. The patient denies alcohol and tobacco use. Denies exposure to radiation or toxins. The patient worked at [employer] for 30+ years. He now sells cars. The patient  is accompanied by his wife, Alexander Kline, today.  Allergies:  Allergies  Allergen Reactions  . Sulfa Antibiotics     Per pt, he gets bad headaches with sulfa drugs    Current Medications: Current Outpatient  Medications  Medication Sig Dispense Refill  . acetaminophen (TYLENOL) 325 MG tablet Take 2 tablets (650 mg total) by mouth every 6 (six) hours as needed for mild pain (or Fever >/= 101). 15 tablet 0  . apixaban (ELIQUIS) 5 MG TABS tablet Take 5 mg by mouth 2 (two) times daily.    Marland Kitchen Apoaequorin (PREVAGEN PO) Take by mouth.    Marland Kitchen atorvastatin (LIPITOR) 80 MG tablet Take 1 tablet (80 mg total) by mouth daily. 30 tablet 1  . B Complex Vitamins (VITAMIN B COMPLEX PO) Take by mouth.    . celecoxib (CELEBREX) 200 MG capsule celecoxib 200 mg capsule  TAKE ONE CAPSULE BY MOUTH DAILY AS NEEDED    . cetirizine (ZYRTEC) 10 MG tablet cetirizine 10 mg tablet  TAKE 1 TABLET BY MOUTH EVERY DAY IN THE MORNING    . Cholecalciferol 125 MCG (5000 UT) TABS Take by mouth.    . Coconut Oil 1000 MG CAPS Take by mouth.    . Coenzyme Q10 10 MG capsule Take by mouth.    . ferrous sulfate 325 (65 FE) MG tablet Take 1 tablet (325 mg total) by mouth 2 (two) times daily with a meal. 100 tablet 2  . fluticasone (FLONASE) 50 MCG/ACT nasal spray fluticasone propionate 50 mcg/actuation nasal spray,suspension  USE 1 SPRAY IN EACH NOSTRIL EVERY DAY    . meloxicam (MOBIC) 15 MG tablet Take 15 mg by mouth daily.  3  . metFORMIN (GLUCOPHAGE-XR) 500 MG 24 hr tablet Take 500 mg by mouth daily.    . methocarbamol (ROBAXIN) 500 MG tablet Take 500 mg by mouth 2 (two) times daily.  1  . Multiple Vitamin (MULTI-VITAMIN) tablet Take 1 tablet by mouth daily.    Ernestine Conrad 3-6-9 CAPS Take by mouth.    Marland Kitchen omeprazole (PRILOSEC) 20 MG capsule Take 20 mg by mouth daily.    . Sodium Fluoride (CLINPRO 5000) 1.1 % PSTE Clinpro 5000 1.1 % dental paste    . Zoster Vaccine Adjuvanted (SHINGRIX) injection Shingrix (PF) 50 mcg/0.5 mL intramuscular suspension, kit  TO BE ADMINISTERED BY PHARMACIST FOR IMMUNIZATION    . amLODipine (NORVASC) 10 MG tablet Take 1 tablet (10 mg total) by mouth daily. (Patient not taking: Reported on 07/26/2018) 30 tablet 1  .  aspirin 81 MG chewable tablet Chew 1 tablet (81 mg total) by mouth daily. (Patient not taking: Reported on 01/14/2020) 30 tablet 1  . clopidogrel (PLAVIX) 75 MG tablet Take 1 tablet (75 mg total) by mouth daily. (Patient not taking: Reported on 01/14/2020) 30 tablet 1  . enalapril (VASOTEC) 2.5 MG tablet Take 2.5 mg by mouth daily. (Patient not taking: Reported on 06/23/2020)    . pantoprazole (PROTONIX) 40 MG tablet Take 1 tablet (40 mg total) by mouth daily. (Patient not taking: Reported on 05/30/2018) 30 tablet 1   No current facility-administered medications for this visit.    Review of Systems  Constitutional: Positive for malaise/fatigue (no energy). Negative for chills, diaphoresis, fever and weight loss.       Feels "fine".  HENT: Negative for congestion, ear discharge, ear pain, hearing loss, nosebleeds, sinus pain, sore throat and tinnitus.        Runny nose due to allergies.  Eyes: Negative for blurred vision  and double vision.  Respiratory: Negative for cough, hemoptysis, sputum production and shortness of breath.   Cardiovascular: Negative for chest pain, palpitations and leg swelling.       Bradycardia with planned pacemaker placement in the future.  Gastrointestinal: Negative for abdominal pain, blood in stool, constipation, diarrhea, heartburn, melena, nausea and vomiting.  Genitourinary: Negative for dysuria, frequency, hematuria and urgency.  Musculoskeletal: Negative for back pain, joint pain, myalgias and neck pain.  Skin: Negative for itching and rash.  Neurological: Negative for dizziness, tingling, sensory change, weakness and headaches.       History of CVA 2 years ago.  Endo/Heme/Allergies: Bruises/bleeds easily.  Psychiatric/Behavioral: Negative for depression and memory loss. The patient is not nervous/anxious and does not have insomnia.   All other systems reviewed and are negative.  Performance status (ECOG): 0  Vitals Blood pressure (!) 142/78, pulse (!) 53,  temperature 98 F (36.7 C), temperature source Tympanic, resp. rate 18, weight 189 lb 9.5 oz (86 kg), SpO2 99 %.   Physical Exam Vitals and nursing note reviewed.  Constitutional:      General: He is not in acute distress.    Appearance: He is not diaphoretic.  HENT:     Head: Normocephalic and atraumatic.     Comments: White hair.    Mouth/Throat:     Mouth: Mucous membranes are moist.     Pharynx: Oropharynx is clear.  Eyes:     General: No scleral icterus.    Extraocular Movements: Extraocular movements intact.     Conjunctiva/sclera: Conjunctivae normal.     Pupils: Pupils are equal, round, and reactive to light.     Comments: Glasses.  Blue eyes.  Cardiovascular:     Rate and Rhythm: Bradycardia present. Rhythm irregular.     Heart sounds: Normal heart sounds. No murmur heard.   Pulmonary:     Effort: Pulmonary effort is normal. No respiratory distress.     Breath sounds: Normal breath sounds. No wheezing or rales.  Chest:     Chest wall: No tenderness.  Abdominal:     General: Bowel sounds are normal. There is no distension.     Palpations: Abdomen is soft. There is no mass.     Tenderness: There is no abdominal tenderness. There is no guarding or rebound.  Musculoskeletal:        General: No swelling or tenderness. Normal range of motion.     Cervical back: Normal range of motion and neck supple.  Lymphadenopathy:     Head:     Right side of head: No preauricular, posterior auricular or occipital adenopathy.     Left side of head: No preauricular, posterior auricular or occipital adenopathy.     Cervical: No cervical adenopathy.     Upper Body:     Right upper body: No supraclavicular or axillary adenopathy.     Left upper body: No supraclavicular or axillary adenopathy.     Lower Body: No right inguinal adenopathy. No left inguinal adenopathy.  Skin:    General: Skin is warm and dry.  Neurological:     Mental Status: He is alert and oriented to person, place,  and time.  Psychiatric:        Behavior: Behavior normal.        Thought Content: Thought content normal.        Judgment: Judgment normal.    Appointment on 06/23/2020  Component Date Value Ref Range Status  . WBC 06/23/2020 9.1  4.0 -  10.5 K/uL Final  . RBC 06/23/2020 3.91* 4.22 - 5.81 MIL/uL Final  . Hemoglobin 06/23/2020 12.1* 13.0 - 17.0 g/dL Final  . HCT 06/23/2020 35.4* 39 - 52 % Final  . MCV 06/23/2020 90.5  80.0 - 100.0 fL Final  . MCH 06/23/2020 30.9  26.0 - 34.0 pg Final  . MCHC 06/23/2020 34.2  30.0 - 36.0 g/dL Final  . RDW 06/23/2020 15.1  11.5 - 15.5 % Final  . Platelets 06/23/2020 205  150 - 400 K/uL Final  . nRBC 06/23/2020 0.0  0.0 - 0.2 % Final  . Neutrophils Relative % 06/23/2020 53  % Final  . Neutro Abs 06/23/2020 4.9  1.7 - 7.7 K/uL Final  . Lymphocytes Relative 06/23/2020 33  % Final  . Lymphs Abs 06/23/2020 3.0  0.7 - 4.0 K/uL Final  . Monocytes Relative 06/23/2020 7  % Final  . Monocytes Absolute 06/23/2020 0.6  0 - 1 K/uL Final  . Eosinophils Relative 06/23/2020 6  % Final  . Eosinophils Absolute 06/23/2020 0.6* 0 - 0 K/uL Final  . Basophils Relative 06/23/2020 1  % Final  . Basophils Absolute 06/23/2020 0.1  0 - 0 K/uL Final  . Immature Granulocytes 06/23/2020 0  % Final  . Abs Immature Granulocytes 06/23/2020 0.03  0.00 - 0.07 K/uL Final   Performed at [facility], [address], [place], [place] [address]    Assessment:  Alexander Bourke. is a 79 y.o. male with stage IIIB chronic kidney disease and an abnormal SPEP.  Labs on 05/24/2020 revealed a hematocrit of 40.2, hemoglobin 13.1, platelets 214,000, WBC 9,600 ([place] 5376). Creatinine was 1.54 (CrCl 42 ml/min). SPEP revealed one or more serum protein fractions outside the normal ranges. There were no abnormal protein bands apparent. UPEP revealed a faint albumin band but no abnormal protein bands (Bence-Jones proteinuria).   The patient received the [place] COVID-19 vaccine in 12/2019  and is planning on getting the booster shot.  Symptomatically, he feels fine.  Exam reveals no adenopathy or hepatosplenomegaly.  Plan: 1.   Labs today:  CBC with diff, CMP, myeloma panel, FLCA. 2.   24 hour urine for UPEP and free light chains. 3.   Abnormal SPEP  Etiology unclear.  Discuss monoclonal gammopathy of unknown significance (MGUS) and lymphoplasmacytic disorders.  Discuss work-up. 4.   RTC in 2 weeks for MD assessment, review of work-up and discussion regarding direction of therapy.  I discussed the assessment and treatment plan with the patient.  The patient was provided an opportunity to ask questions and all were answered.  The patient agreed with the plan and demonstrated an understanding of the instructions.  The patient was advised to call back if the symptoms worsen or if the condition fails to improve as anticipated.    Alexander Gohlke C. Mike Gip, MD, PhD    06/23/2020, 12:01 PM  I, Alexander Kline, am acting as Education administrator for [employer]. Mike Gip, MD, PhD.  I, Alexander Schwimmer C. Mike Gip, MD, have reviewed the above documentation for accuracy and completeness, and I agree with the above.

## 2020-06-23 ENCOUNTER — Encounter: Payer: Self-pay | Admitting: Hematology and Oncology

## 2020-06-23 ENCOUNTER — Other Ambulatory Visit: Payer: Self-pay

## 2020-06-23 ENCOUNTER — Inpatient Hospital Stay: Payer: Medicare Other | Attending: Hematology and Oncology | Admitting: Hematology and Oncology

## 2020-06-23 ENCOUNTER — Inpatient Hospital Stay: Payer: Medicare Other

## 2020-06-23 VITALS — BP 142/78 | HR 53 | Temp 98.0°F | Resp 18 | Wt 189.6 lb

## 2020-06-23 DIAGNOSIS — E785 Hyperlipidemia, unspecified: Secondary | ICD-10-CM | POA: Insufficient documentation

## 2020-06-23 DIAGNOSIS — R803 Bence Jones proteinuria: Secondary | ICD-10-CM | POA: Diagnosis not present

## 2020-06-23 DIAGNOSIS — N1832 Chronic kidney disease, stage 3b: Secondary | ICD-10-CM | POA: Diagnosis present

## 2020-06-23 DIAGNOSIS — I10 Essential (primary) hypertension: Secondary | ICD-10-CM | POA: Insufficient documentation

## 2020-06-23 DIAGNOSIS — R7303 Prediabetes: Secondary | ICD-10-CM | POA: Diagnosis not present

## 2020-06-23 DIAGNOSIS — Z8673 Personal history of transient ischemic attack (TIA), and cerebral infarction without residual deficits: Secondary | ICD-10-CM | POA: Insufficient documentation

## 2020-06-23 DIAGNOSIS — I129 Hypertensive chronic kidney disease with stage 1 through stage 4 chronic kidney disease, or unspecified chronic kidney disease: Secondary | ICD-10-CM | POA: Insufficient documentation

## 2020-06-23 DIAGNOSIS — R778 Other specified abnormalities of plasma proteins: Secondary | ICD-10-CM

## 2020-06-23 DIAGNOSIS — N401 Enlarged prostate with lower urinary tract symptoms: Secondary | ICD-10-CM | POA: Insufficient documentation

## 2020-06-23 LAB — COMPREHENSIVE METABOLIC PANEL
ALT: 29 U/L (ref 0–44)
AST: 29 U/L (ref 15–41)
Albumin: 3.5 g/dL (ref 3.5–5.0)
Alkaline Phosphatase: 88 U/L (ref 38–126)
Anion gap: 8 (ref 5–15)
BUN: 20 mg/dL (ref 8–23)
CO2: 27 mmol/L (ref 22–32)
Calcium: 9.1 mg/dL (ref 8.9–10.3)
Chloride: 108 mmol/L (ref 98–111)
Creatinine, Ser: 1.39 mg/dL — ABNORMAL HIGH (ref 0.61–1.24)
GFR calc Af Amer: 55 mL/min — ABNORMAL LOW (ref >60)
GFR calc non Af Amer: 48 mL/min — ABNORMAL LOW (ref >60)
Glucose, Bld: 98 mg/dL (ref 70–99)
Potassium: 3.9 mmol/L (ref 3.5–5.1)
Sodium: 143 mmol/L (ref 135–145)
Total Bilirubin: 0.6 mg/dL (ref 0.3–1.2)
Total Protein: 6.8 g/dL (ref 6.5–8.1)

## 2020-06-23 LAB — CBC WITH DIFFERENTIAL/PLATELET
Abs Immature Granulocytes: 0.03 10*3/uL (ref 0.00–0.07)
Basophils Absolute: 0.1 10*3/uL (ref 0.0–0.1)
Basophils Relative: 1 %
Eosinophils Absolute: 0.6 10*3/uL — ABNORMAL HIGH (ref 0.0–0.5)
Eosinophils Relative: 6 %
HCT: 35.4 % — ABNORMAL LOW (ref 39.0–52.0)
Hemoglobin: 12.1 g/dL — ABNORMAL LOW (ref 13.0–17.0)
Immature Granulocytes: 0 %
Lymphocytes Relative: 33 %
Lymphs Abs: 3 10*3/uL (ref 0.7–4.0)
MCH: 30.9 pg (ref 26.0–34.0)
MCHC: 34.2 g/dL (ref 30.0–36.0)
MCV: 90.5 fL (ref 80.0–100.0)
Monocytes Absolute: 0.6 10*3/uL (ref 0.1–1.0)
Monocytes Relative: 7 %
Neutro Abs: 4.9 10*3/uL (ref 1.7–7.7)
Neutrophils Relative %: 53 %
Platelets: 205 10*3/uL (ref 150–400)
RBC: 3.91 MIL/uL — ABNORMAL LOW (ref 4.22–5.81)
RDW: 15.1 % (ref 11.5–15.5)
WBC: 9.1 10*3/uL (ref 4.0–10.5)
nRBC: 0 % (ref 0.0–0.2)

## 2020-06-23 NOTE — Progress Notes (Signed)
Shortness of breath, needs pacemaker, per pt. Kidney function 42%.

## 2020-06-24 LAB — KAPPA/LAMBDA LIGHT CHAINS
Kappa free light chain: 41.3 mg/L — ABNORMAL HIGH (ref 3.3–19.4)
Kappa, lambda light chain ratio: 1.51 (ref 0.26–1.65)
Lambda free light chains: 27.4 mg/L — ABNORMAL HIGH (ref 5.7–26.3)

## 2020-06-25 ENCOUNTER — Other Ambulatory Visit
Admission: RE | Admit: 2020-06-25 | Discharge: 2020-06-25 | Disposition: A | Discharge status: Home / Self Care | Payer: Medicare Other | Attending: Hematology and Oncology | Admitting: Hematology and Oncology

## 2020-06-25 ENCOUNTER — Other Ambulatory Visit: Payer: Self-pay

## 2020-06-25 DIAGNOSIS — R778 Other specified abnormalities of plasma proteins: Secondary | ICD-10-CM | POA: Insufficient documentation

## 2020-06-28 LAB — UPEP/TP, 24-HR URINE
Albumin, U: 27.7 %
Alpha 1, Urine: 0.9 %
Alpha 2, Urine: 10.7 %
Beta, Urine: 31.7 %
Gamma Globulin, Urine: 28.9 %
Total Protein, Urine-Ur/day: 71 mg/24 hr (ref 30–150)
Total Protein, Urine: 5.7 mg/dL
Total Volume: 1250

## 2020-06-28 LAB — FREE K+L LT CHAINS,QN,UR
Free Kappa Lt Chains,Ur: 57.31 mg/L (ref 0.63–113.79)
Free Kappa/Lambda Ratio: 8.57 (ref 1.03–31.76)
Free Lambda Lt Chains,Ur: 6.69 mg/L (ref 0.47–11.77)
Total Volume: 1250

## 2020-07-08 NOTE — Progress Notes (Signed)
[facility]  [address], [address] [place], [place] [address] Phone: [phone]  Fax: [phone]   Clinic Day:  07/12/2020  Referring physician: Jodi Marble, MD  Chief Complaint: Alexander Kline. is a 79 y.o. male with stage IIIb chronic kidney disease and an abnormal SPEP who is seen for review of work-up and discussion regarding direction of therapy.  HPI: The patient was last seen in the hematology clinic on 06/23/2020 for new patient assessment. At that time, he felt fine.  Exam revealed no adenopathy or hepatosplenomegaly.   Work-up revealed a hematocrit of 35.4, hemoglobin 12.1, MCV 90.5, platelets 205,000, WBC 9,100. Creatinine was 1.39 (CrCl 48 ml/min). No M spike was observed. Kappa free light chain was 41.3, lambda free light chain 27.4, ratio 1.51. 24 hour UPEP was normal. Urine kappa free light chains were 57.31, lambda free light chains 6.69, and ratio 8.57 (normal).  During the interim, he has been "pretty good." He states that his creatinine clearance has recently increased from 45 to 52 ml/min. He cut out diet Coke from his diet and now only drinks water and milk. He is sluggish and tired all day. His other symptoms are unchanged. He takes oral iron once daily. Denies blood in the stool and black stools.  The patient states that his pulse is usually in the 30-40s in the mornings. He was told that he needed a pacemaker but the insurance company would not cover it. He wore a Holter monitor and is waiting for the results to see if the insurance will cover it then.  The patient agrees to following up prn going forward.   Past Medical History:  Diagnosis Date  . Hyperlipidemia   . Hypertension   . Pre-diabetes   . Stroke [facility])     Past Surgical History:  Procedure Laterality Date  . COLONOSCOPY WITH PROPOFOL N/A 01/14/2020   Procedure: COLONOSCOPY WITH PROPOFOL;  Surgeon: Toledo, Benay Pike, MD;  Location: [facility] ENDOSCOPY;  Service:  Gastroenterology;  Laterality: N/A;  . ESOPHAGOGASTRODUODENOSCOPY (EGD) WITH PROPOFOL N/A 01/14/2020   Procedure: ESOPHAGOGASTRODUODENOSCOPY (EGD) WITH PROPOFOL;  Surgeon: Toledo, Benay Pike, MD;  Location: [facility] ENDOSCOPY;  Service: Gastroenterology;  Laterality: N/A;  . HIP ARTHROPLASTY Right 09/29/2017   Procedure: ARTHROPLASTY BIPOLAR HIP (HEMIARTHROPLASTY);  Surgeon: Earnestine Leys, MD;  Location: [facility] ORS;  Service: Orthopedics;  Laterality: Right;  . JOINT REPLACEMENT    . TEE WITHOUT CARDIOVERSION N/A 07/26/2018   Procedure: TRANSESOPHAGEAL ECHOCARDIOGRAM (TEE);  Surgeon: Dionisio David, MD;  Location: [facility] ORS;  Service: Cardiovascular;  Laterality: N/A;    Family History  Problem Relation Age of Onset  . CAD Father   . CAD Brother     Social History:  reports that he has never smoked. He has never used smokeless tobacco. He reports that he does not drink alcohol and does not use drugs. The patient denies alcohol and tobacco use. Denies exposure to radiation or toxins. The patient worked at [employer] for 30+ years. He now sells cars. The patient is alone today.  Allergies:  Allergies  Allergen Reactions  . Sulfa Antibiotics     Per pt, he gets bad headaches with sulfa drugs    Current Medications: Current Outpatient Medications  Medication Sig Dispense Refill  . apixaban (ELIQUIS) 5 MG TABS tablet Take 5 mg by mouth 2 (two) times daily.     Marland Kitchen Apoaequorin (PREVAGEN PO) Take by mouth.    Marland Kitchen atorvastatin (LIPITOR) 80 MG tablet Take  1 tablet (80 mg total) by mouth daily. 30 tablet 1  . B Complex Vitamins (VITAMIN B COMPLEX PO) Take by mouth.    . Cholecalciferol 125 MCG (5000 UT) TABS Take by mouth.    . Coconut Oil 1000 MG CAPS Take by mouth.    . Coenzyme Q10 10 MG capsule Take by mouth.    . metFORMIN (GLUCOPHAGE-XR) 500 MG 24 hr tablet Take 500 mg by mouth daily.    Ernestine Conrad 3-6-9 CAPS Take by mouth.    Marland Kitchen omeprazole (PRILOSEC) 20 MG capsule Take 20 mg by mouth daily.    Marland Kitchen  acetaminophen (TYLENOL) 325 MG tablet Take 2 tablets (650 mg total) by mouth every 6 (six) hours as needed for mild pain (or Fever >/= 101). (Patient not taking: Reported on 07/12/2020) 15 tablet 0  . amLODipine (NORVASC) 10 MG tablet Take 1 tablet (10 mg total) by mouth daily. (Patient not taking: Reported on 07/26/2018) 30 tablet 1  . aspirin 81 MG chewable tablet Chew 1 tablet (81 mg total) by mouth daily. (Patient not taking: Reported on 01/14/2020) 30 tablet 1  . celecoxib (CELEBREX) 200 MG capsule celecoxib 200 mg capsule  TAKE ONE CAPSULE BY MOUTH DAILY AS NEEDED (Patient not taking: Reported on 07/12/2020)    . cetirizine (ZYRTEC) 10 MG tablet cetirizine 10 mg tablet  TAKE 1 TABLET BY MOUTH EVERY DAY IN THE MORNING (Patient not taking: Reported on 07/12/2020)    . clopidogrel (PLAVIX) 75 MG tablet Take 1 tablet (75 mg total) by mouth daily. (Patient not taking: Reported on 01/14/2020) 30 tablet 1  . enalapril (VASOTEC) 2.5 MG tablet Take 2.5 mg by mouth daily. (Patient not taking: Reported on 06/23/2020)    . ferrous sulfate 325 (65 FE) MG tablet Take 1 tablet (325 mg total) by mouth 2 (two) times daily with a meal. (Patient not taking: Reported on 07/12/2020) 100 tablet 2  . fluticasone (FLONASE) 50 MCG/ACT nasal spray fluticasone propionate 50 mcg/actuation nasal spray,suspension  USE 1 SPRAY IN EACH NOSTRIL EVERY DAY (Patient not taking: Reported on 07/12/2020)    . meloxicam (MOBIC) 15 MG tablet Take 15 mg by mouth daily. (Patient not taking: Reported on 07/12/2020)  3  . methocarbamol (ROBAXIN) 500 MG tablet Take 500 mg by mouth 2 (two) times daily. (Patient not taking: Reported on 07/12/2020)  1  . Multiple Vitamin (MULTI-VITAMIN) tablet Take 1 tablet by mouth daily. (Patient not taking: Reported on 07/12/2020)    . pantoprazole (PROTONIX) 40 MG tablet Take 1 tablet (40 mg total) by mouth daily. (Patient not taking: Reported on 05/30/2018) 30 tablet 1  . Sodium Fluoride (CLINPRO 5000) 1.1 % PSTE  Clinpro 5000 1.1 % dental paste (Patient not taking: Reported on 07/12/2020)    . Zoster Vaccine Adjuvanted [facility]) injection Shingrix (PF) 50 mcg/0.5 mL intramuscular suspension, kit  TO BE ADMINISTERED BY PHARMACIST FOR IMMUNIZATION (Patient not taking: Reported on 07/12/2020)     No current facility-administered medications for this visit.    Review of Systems  Constitutional: Positive for malaise/fatigue (no energy). Negative for chills, diaphoresis, fever and weight loss (stable).       Feels "pretty good."  HENT: Negative for congestion, ear discharge, ear pain, hearing loss, nosebleeds, sinus pain, sore throat and tinnitus.   Eyes: Negative for blurred vision and double vision.  Respiratory: Negative for cough, hemoptysis, sputum production and shortness of breath.   Cardiovascular: Negative for chest pain, palpitations and leg swelling.  Bradycardia.  Gastrointestinal: Negative for abdominal pain, blood in stool, constipation, diarrhea, heartburn, melena, nausea and vomiting.  Genitourinary: Negative for dysuria, frequency, hematuria and urgency.  Musculoskeletal: Negative for back pain, joint pain, myalgias and neck pain.  Skin: Negative for itching and rash.  Neurological: Negative for dizziness, tingling, sensory change, weakness and headaches.       History of CVA 2 years ago.  Endo/Heme/Allergies: Positive for environmental allergies (runny nose). Bruises/bleeds easily.  Psychiatric/Behavioral: Negative for depression and memory loss. The patient is not nervous/anxious and does not have insomnia.   All other systems reviewed and are negative.  Performance status (ECOG): 0-1  Vitals Blood pressure 140/81, pulse 61, temperature 98 F (36.7 C), temperature source Tympanic, resp. rate 18, height _0  (1.803 m), weight 189 lb 2.5 oz (85.8 kg), SpO2 97 %.   Physical Exam Vitals and nursing note reviewed.  Constitutional:      General: He is not in acute distress.     Appearance: He is not diaphoretic.  HENT:     Head:     Comments: White hair. Eyes:     General: No scleral icterus.    Conjunctiva/sclera: Conjunctivae normal.     Comments: Glasses.  Blue eyes.  Neurological:     Mental Status: He is alert and oriented to person, place, and time.  Psychiatric:        Behavior: Behavior normal.        Thought Content: Thought content normal.        Judgment: Judgment normal.    No visits with results within 3 Day(s) from this visit.  Latest known visit with results is:  Hospital Outpatient Visit on 06/25/2020  Component Date Value Ref Range Status  . Free Kappa Lt Chains,Ur 06/25/2020 57.31  0.63 - 113.79 mg/L Final  . Free Lambda Lt Chains,Ur 06/25/2020 6.69  0.47 - 11.77 mg/L Final  . Free Kappa/Lambda Ratio 06/25/2020 8.57  1.03 - 31.76 Final   Comment: (NOTE) Performed At: [facility] [address] [place], [place] [id number] Rush Farmer MD OZ:[id number]   . Total Volume 06/25/2020 1,250   Final   Performed at [facility], [address] [place], [place] [address]  . Total Protein, Urine 06/25/2020 5.7  Not Estab. mg/dL Final  . Total Protein, Urine-Ur/day 06/25/2020 71  30 - 150 mg/24 hr Final  . Albumin, U 06/25/2020 27.7  % Final  . Alpha 1, Urine 06/25/2020 0.9  % Final  . Alpha 2, Urine 06/25/2020 10.7  % Final  . Beta, Urine 06/25/2020 31.7  % Final  . Gamma Globulin, Urine 06/25/2020 28.9  % Final  . M-spike, % 06/25/2020 Not Observed  Not Observed % Final  . Please Note: 06/25/2020 Comment   Final   Comment: (NOTE) Protein electrophoresis scan will follow via computer, mail, or courier delivery. Performed At: [facility] [place], [place] [id number] Rush Farmer MD UU:[id number]   . Total Volume 06/25/2020 1,250   Final   Performed at [facility], [address], [place], [place] [address]    Assessment:  Alexander Kline. is a 79 y.o. male with stage  IIIB chronic kidney disease and an abnormal SPEP.  Labs on 05/24/2020 revealed a hematocrit of 40.2, hemoglobin 13.1, platelets 214,000, WBC 9,600 (Cameron 5376). Creatinine was 1.54 (CrCl 42 ml/min). SPEP revealed one or more serum protein fractions outside the normal ranges. There were no abnormal protein bands apparent. UPEP  revealed a faint albumin band but no abnormal protein bands (Bence-Jones proteinuria).   Work-up on 06/23/2020 revealed a hematocrit of 35.4, hemoglobin 12.1, MCV 90.5, platelets 205,000, WBC 9,100. Creatinine was 1.39 (CrCl 48 ml/min). Kappa free light chain was 41.3, lambda free light chain 27.4, ratio 1.51. 24 hour UPEP was normal. Urine kappa free light chains were 57.31, lambda free light chains 6.69, and ratio 8.57 (normal). SPEP is pending.  The patient received the [place] COVID-19 vaccine in 12/2019 and is planning on getting the booster shot.  Symptomatically, he feels "pretty good."  He is sluggish and tired all day. He takes oral iron once daily. He states that his pulse is usually in the 30-40s in the mornings.  Plan: 1.   Review work-up 2.   Abnormal SPEP  Work-up reveals no monoclonal gammopathy.  Reassurance provided. 3.   Normocytic anemia  He denies any bleeding.  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 4.   RTC prn.  I discussed the assessment and treatment plan with the patient.  The patient was provided an opportunity to ask questions and all were answered.  The patient agreed with the plan and demonstrated an understanding of the instructions.  The patient was advised to call back if the symptoms worsen or if the condition fails to improve as anticipated.  I provided 13 minutes of face-to-face time during this this encounter and > 50% was spent counseling as documented under my assessment and plan.   Kristiana Jacko C. Mike Gip, MD, PhD    07/12/2020, 2:09 PM  I, Mirian Mo Tufford, am acting as Education administrator for [employer]. Mike Gip, MD,  PhD.  I, Ashawnti Tangen C. Mike Gip, MD, have reviewed the above documentation for accuracy and completeness, and I agree with the above.

## 2020-07-09 ENCOUNTER — Other Ambulatory Visit: Admission: RE | Admit: 2020-07-09 | Payer: Medicare Other | Source: Ambulatory Visit

## 2020-07-12 ENCOUNTER — Encounter: Payer: Self-pay | Admitting: Hematology and Oncology

## 2020-07-12 ENCOUNTER — Inpatient Hospital Stay: Payer: Medicare Other | Attending: Hematology and Oncology | Admitting: Hematology and Oncology

## 2020-07-12 ENCOUNTER — Other Ambulatory Visit: Payer: Self-pay

## 2020-07-12 VITALS — BP 140/81 | HR 61 | Temp 98.0°F | Resp 18 | Ht 71.0 in | Wt 189.2 lb

## 2020-07-12 DIAGNOSIS — Z7901 Long term (current) use of anticoagulants: Secondary | ICD-10-CM | POA: Insufficient documentation

## 2020-07-12 DIAGNOSIS — Z791 Long term (current) use of non-steroidal anti-inflammatories (NSAID): Secondary | ICD-10-CM | POA: Diagnosis not present

## 2020-07-12 DIAGNOSIS — Z7982 Long term (current) use of aspirin: Secondary | ICD-10-CM | POA: Diagnosis not present

## 2020-07-12 DIAGNOSIS — E785 Hyperlipidemia, unspecified: Secondary | ICD-10-CM | POA: Insufficient documentation

## 2020-07-12 DIAGNOSIS — I10 Essential (primary) hypertension: Secondary | ICD-10-CM | POA: Diagnosis not present

## 2020-07-12 DIAGNOSIS — R778 Other specified abnormalities of plasma proteins: Secondary | ICD-10-CM | POA: Diagnosis not present

## 2020-07-12 DIAGNOSIS — Z8673 Personal history of transient ischemic attack (TIA), and cerebral infarction without residual deficits: Secondary | ICD-10-CM | POA: Diagnosis not present

## 2020-07-12 DIAGNOSIS — Z79899 Other long term (current) drug therapy: Secondary | ICD-10-CM | POA: Diagnosis not present

## 2020-07-12 DIAGNOSIS — N1832 Chronic kidney disease, stage 3b: Secondary | ICD-10-CM | POA: Diagnosis not present

## 2020-07-12 DIAGNOSIS — Z7984 Long term (current) use of oral hypoglycemic drugs: Secondary | ICD-10-CM | POA: Diagnosis not present

## 2020-07-12 DIAGNOSIS — D649 Anemia, unspecified: Secondary | ICD-10-CM | POA: Diagnosis not present

## 2020-07-12 DIAGNOSIS — Z8249 Family history of ischemic heart disease and other diseases of the circulatory system: Secondary | ICD-10-CM | POA: Insufficient documentation

## 2020-07-12 NOTE — Progress Notes (Signed)
No new changes noted today 

## 2020-07-13 ENCOUNTER — Ambulatory Visit: Admit: 2020-07-13 | Payer: Medicare Other | Admitting: Cardiology

## 2020-07-13 SURGERY — INSERTION, CARDIAC PACEMAKER
Anesthesia: Choice | Laterality: Left

## 2020-07-16 ENCOUNTER — Other Ambulatory Visit: Payer: Self-pay

## 2020-07-16 ENCOUNTER — Other Ambulatory Visit
Admission: RE | Admit: 2020-07-16 | Discharge: 2020-07-16 | Disposition: A | Discharge status: Home / Self Care | Payer: Medicare Other | Source: Ambulatory Visit | Attending: Cardiology | Admitting: Cardiology

## 2020-07-16 DIAGNOSIS — Z01812 Encounter for preprocedural laboratory examination: Secondary | ICD-10-CM | POA: Insufficient documentation

## 2020-07-16 DIAGNOSIS — Z20822 Contact with and (suspected) exposure to covid-19: Secondary | ICD-10-CM | POA: Diagnosis not present

## 2020-07-17 LAB — SARS CORONAVIRUS 2 (TAT 6-24 HRS): SARS Coronavirus 2: NEGATIVE

## 2020-07-20 ENCOUNTER — Inpatient Hospital Stay: Admission: RE | Admit: 2020-07-20 | Payer: Medicare Other | Source: Home / Self Care | Admitting: Cardiology

## 2020-07-20 ENCOUNTER — Encounter: Admission: RE | Payer: Self-pay | Source: Home / Self Care

## 2020-07-20 SURGERY — INSERTION, CARDIAC PACEMAKER
Anesthesia: Choice | Laterality: Left

## 2020-09-17 ENCOUNTER — Observation Stay: Payer: Medicare Other

## 2020-09-17 ENCOUNTER — Other Ambulatory Visit: Payer: Self-pay

## 2020-09-17 ENCOUNTER — Observation Stay
Admission: EM | Admit: 2020-09-17 | Discharge: 2020-09-18 | Disposition: A | Discharge status: Home / Self Care | Payer: Medicare Other | Attending: Internal Medicine | Admitting: Internal Medicine

## 2020-09-17 ENCOUNTER — Emergency Department: Payer: Medicare Other

## 2020-09-17 DIAGNOSIS — Z20822 Contact with and (suspected) exposure to covid-19: Secondary | ICD-10-CM | POA: Insufficient documentation

## 2020-09-17 DIAGNOSIS — G459 Transient cerebral ischemic attack, unspecified: Secondary | ICD-10-CM

## 2020-09-17 DIAGNOSIS — Z96641 Presence of right artificial hip joint: Secondary | ICD-10-CM | POA: Insufficient documentation

## 2020-09-17 DIAGNOSIS — E785 Hyperlipidemia, unspecified: Secondary | ICD-10-CM

## 2020-09-17 DIAGNOSIS — E876 Hypokalemia: Secondary | ICD-10-CM | POA: Diagnosis not present

## 2020-09-17 DIAGNOSIS — Z79899 Other long term (current) drug therapy: Secondary | ICD-10-CM | POA: Diagnosis not present

## 2020-09-17 DIAGNOSIS — E119 Type 2 diabetes mellitus without complications: Secondary | ICD-10-CM | POA: Diagnosis not present

## 2020-09-17 DIAGNOSIS — Z7984 Long term (current) use of oral hypoglycemic drugs: Secondary | ICD-10-CM | POA: Diagnosis not present

## 2020-09-17 DIAGNOSIS — I10 Essential (primary) hypertension: Secondary | ICD-10-CM | POA: Diagnosis not present

## 2020-09-17 DIAGNOSIS — R4182 Altered mental status, unspecified: Secondary | ICD-10-CM | POA: Diagnosis present

## 2020-09-17 DIAGNOSIS — R410 Disorientation, unspecified: Secondary | ICD-10-CM | POA: Diagnosis not present

## 2020-09-17 LAB — BASIC METABOLIC PANEL
Anion gap: 9 (ref 5–15)
BUN: 18 mg/dL (ref 8–23)
CO2: 31 mmol/L (ref 22–32)
Calcium: 9.5 mg/dL (ref 8.9–10.3)
Chloride: 102 mmol/L (ref 98–111)
Creatinine, Ser: 1.33 mg/dL — ABNORMAL HIGH (ref 0.61–1.24)
GFR, Estimated: 54 mL/min — ABNORMAL LOW (ref >60)
Glucose, Bld: 159 mg/dL — ABNORMAL HIGH (ref 70–99)
Potassium: 3.2 mmol/L — ABNORMAL LOW (ref 3.5–5.1)
Sodium: 142 mmol/L (ref 135–145)

## 2020-09-17 LAB — DIFFERENTIAL
Abs Immature Granulocytes: 0.02 10*3/uL (ref 0.00–0.07)
Basophils Absolute: 0.1 10*3/uL (ref 0.0–0.1)
Basophils Relative: 1 %
Eosinophils Absolute: 0.6 10*3/uL — ABNORMAL HIGH (ref 0.0–0.5)
Eosinophils Relative: 7 %
Immature Granulocytes: 0 %
Lymphocytes Relative: 32 %
Lymphs Abs: 2.7 10*3/uL (ref 0.7–4.0)
Monocytes Absolute: 0.5 10*3/uL (ref 0.1–1.0)
Monocytes Relative: 6 %
Neutro Abs: 4.4 10*3/uL (ref 1.7–7.7)
Neutrophils Relative %: 54 %

## 2020-09-17 LAB — URINALYSIS, COMPLETE (UACMP) WITH MICROSCOPIC
Bacteria, UA: NONE SEEN
Bilirubin Urine: NEGATIVE
Glucose, UA: NEGATIVE mg/dL
Hgb urine dipstick: NEGATIVE
Ketones, ur: NEGATIVE mg/dL
Leukocytes,Ua: NEGATIVE
Nitrite: NEGATIVE
Protein, ur: NEGATIVE mg/dL
Specific Gravity, Urine: 1.017 (ref 1.005–1.030)
Squamous Epithelial / HPF: NONE SEEN (ref 0–5)
pH: 5 (ref 5.0–8.0)

## 2020-09-17 LAB — CBC
HCT: 38.8 % — ABNORMAL LOW (ref 39.0–52.0)
Hemoglobin: 13.2 g/dL (ref 13.0–17.0)
MCH: 31 pg (ref 26.0–34.0)
MCHC: 34 g/dL (ref 30.0–36.0)
MCV: 91.1 fL (ref 80.0–100.0)
Platelets: 209 10*3/uL (ref 150–400)
RBC: 4.26 MIL/uL (ref 4.22–5.81)
RDW: 14 % (ref 11.5–15.5)
WBC: 8.2 10*3/uL (ref 4.0–10.5)
nRBC: 0 % (ref 0.0–0.2)

## 2020-09-17 LAB — TROPONIN I (HIGH SENSITIVITY)
Troponin I (High Sensitivity): 12 ng/L (ref <18)
Troponin I (High Sensitivity): 14 ng/L (ref <18)

## 2020-09-17 LAB — HEPATIC FUNCTION PANEL
ALT: 22 U/L (ref 0–44)
AST: 28 U/L (ref 15–41)
Albumin: 3.4 g/dL — ABNORMAL LOW (ref 3.5–5.0)
Alkaline Phosphatase: 116 U/L (ref 38–126)
Bilirubin, Direct: 0.1 mg/dL (ref 0.0–0.2)
Indirect Bilirubin: 0.6 mg/dL (ref 0.3–0.9)
Total Bilirubin: 0.7 mg/dL (ref 0.3–1.2)
Total Protein: 6.6 g/dL (ref 6.5–8.1)

## 2020-09-17 LAB — LACTIC ACID, PLASMA: Lactic Acid, Venous: 1.2 mmol/L (ref 0.5–1.9)

## 2020-09-17 LAB — PROCALCITONIN: Procalcitonin: <0.1 ng/mL

## 2020-09-17 LAB — RESP PANEL BY RT-PCR (FLU A&B, COVID) ARPGX2
Influenza A by PCR: NEGATIVE
Influenza B by PCR: NEGATIVE
SARS Coronavirus 2 by RT PCR: NEGATIVE

## 2020-09-17 LAB — BRAIN NATRIURETIC PEPTIDE: B Natriuretic Peptide: 88.7 pg/mL (ref 0.0–100.0)

## 2020-09-17 MED ORDER — SODIUM CHLORIDE 0.9 % IV SOLN: INTRAVENOUS | Status: DC

## 2020-09-17 MED ORDER — FLUTICASONE PROPIONATE 50 MCG/ACT NA SUSP
1.0000 | Freq: Every day | NASAL | Status: DC | PRN
  Filled 2020-09-17: qty 16

## 2020-09-17 MED ORDER — ENALAPRIL MALEATE 2.5 MG PO TABS
2.5000 mg | ORAL_TABLET | Freq: Every day | ORAL | Status: DC
  Administered 2020-09-18: 12:00:00 2.5 mg via ORAL
  Filled 2020-09-17: qty 1

## 2020-09-17 MED ORDER — OMEGA-3-ACID ETHYL ESTERS 1 G PO CAPS
1.0000 g | ORAL_CAPSULE | Freq: Every evening | ORAL | Status: DC
  Administered 2020-09-18: 02:00:00 1 g via ORAL
  Filled 2020-09-17: qty 1

## 2020-09-17 MED ORDER — COQ-10 100 MG PO CAPS: ORAL_CAPSULE | Freq: Every evening | ORAL | Status: DC

## 2020-09-17 MED ORDER — STROKE: EARLY STAGES OF RECOVERY BOOK: Freq: Once | Status: AC

## 2020-09-17 MED ORDER — D-MANNOSE 500 MG PO CAPS: 600.0000 mg | ORAL_CAPSULE | Freq: Every day | ORAL | Status: DC

## 2020-09-17 MED ORDER — APIXABAN 5 MG PO TABS
5.0000 mg | ORAL_TABLET | Freq: Two times a day (BID) | ORAL | Status: DC
  Administered 2020-09-18 (×2): 5 mg via ORAL
  Filled 2020-09-17 (×2): qty 1

## 2020-09-17 MED ORDER — ANTIOXIDANT FORMULA PO TABS: 1.0000 | ORAL_TABLET | Freq: Every evening | ORAL | Status: DC

## 2020-09-17 MED ORDER — FERROUS SULFATE 325 (65 FE) MG PO TABS
325.0000 mg | ORAL_TABLET | Freq: Every day | ORAL | Status: DC
  Administered 2020-09-18: 12:00:00 325 mg via ORAL
  Filled 2020-09-17: qty 1

## 2020-09-17 MED ORDER — PANTOPRAZOLE SODIUM 40 MG PO TBEC
40.0000 mg | DELAYED_RELEASE_TABLET | Freq: Every day | ORAL | Status: DC
  Administered 2020-09-18: 12:00:00 40 mg via ORAL
  Filled 2020-09-17: qty 1

## 2020-09-17 MED ORDER — APOAEQUORIN 10 MG PO CAPS: ORAL_CAPSULE | Freq: Every day | ORAL | Status: DC

## 2020-09-17 MED ORDER — RENA-VITE PO TABS
1.0000 | ORAL_TABLET | Freq: Every day | ORAL | Status: DC
  Administered 2020-09-18: 1 via ORAL
  Filled 2020-09-17: qty 1

## 2020-09-17 MED ORDER — VITAMIN D 25 MCG (1000 UNIT) PO TABS
5000.0000 [IU] | ORAL_TABLET | Freq: Every day | ORAL | Status: DC
  Administered 2020-09-18: 12:00:00 5000 [IU] via ORAL
  Filled 2020-09-17: qty 5

## 2020-09-17 MED ORDER — ACETAMINOPHEN 325 MG PO TABS: 650.0000 mg | ORAL_TABLET | ORAL | Status: DC | PRN

## 2020-09-17 MED ORDER — ACETAMINOPHEN 160 MG/5ML PO SOLN
650.0000 mg | ORAL | Status: DC | PRN
  Filled 2020-09-17: qty 20.3

## 2020-09-17 MED ORDER — ATORVASTATIN CALCIUM 20 MG PO TABS
80.0000 mg | ORAL_TABLET | Freq: Every day | ORAL | Status: DC
  Administered 2020-09-18: 12:00:00 80 mg via ORAL
  Filled 2020-09-17: qty 4

## 2020-09-17 MED ORDER — SENNOSIDES-DOCUSATE SODIUM 8.6-50 MG PO TABS: 1.0000 | ORAL_TABLET | Freq: Every evening | ORAL | Status: DC | PRN

## 2020-09-17 MED ORDER — ASPIRIN EC 81 MG PO TBEC
81.0000 mg | DELAYED_RELEASE_TABLET | Freq: Every day | ORAL | Status: DC
  Administered 2020-09-18: 12:00:00 81 mg via ORAL
  Filled 2020-09-17: qty 1

## 2020-09-17 MED ORDER — ACETAMINOPHEN 650 MG RE SUPP: 650.0000 mg | RECTAL | Status: DC | PRN

## 2020-09-17 MED ORDER — ADULT MULTIVITAMIN W/MINERALS CH: 1.0000 | ORAL_TABLET | Freq: Every evening | ORAL | Status: DC

## 2020-09-17 MED ORDER — COCONUT OIL 1000 MG PO CAPS: 1000.0000 mg | ORAL_CAPSULE | Freq: Every evening | ORAL | Status: DC

## 2020-09-17 NOTE — H&P (Addendum)
PATIENT NAME: Alexander Kline    MR#:  [id number]  DATE OF BIRTH:  Mar 03, 1941  DATE OF ADMISSION:  09/17/2020  PRIMARY CARE PHYSICIAN: Sherron Monday, MD   REQUESTING/REFERRING PHYSICIAN: Dorothea Glassman, MD  CHIEF COMPLAINT:  Confusion  HISTORY OF PRESENT ILLNESS:  Alexander Kline  is a 79 y.o. Caucasian male with a known history of hypertension, dyslipidemia prediabetes and CVA, who presented to the emergency room with the onset of confusion.  The patient was at a restaurant and went to the bathroom.  He urinated in a corner rather than in the urinal.  He then walked out and fell.  He then went outside looking for his car.  He has been having slightly worsening confusion over the last couple days and intermittent confusion over the last 3 weeks.  His wife stated that he has been having difficulty with using TV control 1 day and would be able to do it the next day.  His gait has been more shuffled.  He was overall acting like 2 years ago when he had a TIA.  No fever or chills.  No cough or wheezing or chest pain.  No paresthesias or focal muscle weakness.  No dysphagia or dysarthria.Marland Kitchen  Upon presentation to the emergency room, blood pressure was 145/72 with a pulse of 43 with otherwise normal vital signs.  BP later on was 170/100 with a pulse of 79.  Labs revealed mild hypokalemia of 3.2 and hypoalbuminemia of 3.4 with a blood glucose of 159 and creatinine 1.33 compared to similar values previously.  BNP was 88.7 and high-sensitivity troponin was 12.  Lactic acid was 1.2 and CBC was unremarkable.  UA was negative.  Head and C-spine CT scan showed no acute intracranial normalities and no fracture or traumatic malalignment in the cervical spine.  It showed significant multilevel degenerative disc disease.  Portable chest ray showed no acute cardiopulmonary disease.EKG showed sinus rhythm rate of 60 with premature supraventricular complexes poor R wave progression inferior Q  waves  The patient will be admitted to an observation medical monitored bed for further evaluation and management. PAST MEDICAL HISTORY:   Past Medical History:  Diagnosis Date  . Hyperlipidemia   . Hypertension   . Pre-diabetes   . Stroke [facility])     PAST SURGICAL HISTORY:   Past Surgical History:  Procedure Laterality Date  . COLONOSCOPY WITH PROPOFOL N/A 01/14/2020   Procedure: COLONOSCOPY WITH PROPOFOL;  Surgeon: Toledo, Boykin Nearing, MD;  Location: [facility] ENDOSCOPY;  Service: Gastroenterology;  Laterality: N/A;  . ESOPHAGOGASTRODUODENOSCOPY (EGD) WITH PROPOFOL N/A 01/14/2020   Procedure: ESOPHAGOGASTRODUODENOSCOPY (EGD) WITH PROPOFOL;  Surgeon: Toledo, Boykin Nearing, MD;  Location: [facility] ENDOSCOPY;  Service: Gastroenterology;  Laterality: N/A;  . HIP ARTHROPLASTY Right 09/29/2017   Procedure: ARTHROPLASTY BIPOLAR HIP (HEMIARTHROPLASTY);  Surgeon: Deeann Saint, MD;  Location: [facility] ORS;  Service: Orthopedics;  Laterality: Right;  . JOINT REPLACEMENT    . TEE WITHOUT CARDIOVERSION N/A 07/26/2018   Procedure: TRANSESOPHAGEAL ECHOCARDIOGRAM (TEE);  Surgeon: Laurier Nancy, MD;  Location: [facility] ORS;  Service: Cardiovascular;  Laterality: N/A;    SOCIAL HISTORY:   Social History   Tobacco Use  . Smoking status: Never Smoker  . Smokeless tobacco: Never Used  Substance Use Topics  . Alcohol use: No    Alcohol/week: 0.0 standard drinks    FAMILY HISTORY:   Family History  Problem Relation Age of Onset  . CAD Father   . CAD  Brother     DRUG ALLERGIES:   Allergies  Allergen Reactions  . Sulfa Antibiotics     Per pt, he gets bad headaches with sulfa drugs    REVIEW OF SYSTEMS:   ROS As per history of present illness. All pertinent systems were reviewed above. Constitutional, HEENT, cardiovascular, respiratory, GI, GU, musculoskeletal, neuro, psychiatric, endocrine, integumentary and hematologic systems were reviewed and are otherwise negative/unremarkable except for positive  findings mentioned above in the HPI.   MEDICATIONS AT HOME:   Prior to Admission medications   Medication Sig Start Date End Date Taking? Authorizing Provider  apixaban (ELIQUIS) 5 MG TABS tablet Take 5 mg by mouth 2 (two) times daily.     [provider]  Apoaequorin (PREVAGEN PO) Take 1 tablet by mouth daily.     [provider]  atorvastatin (LIPITOR) 80 MG tablet Take 1 tablet (80 mg total) by mouth daily. 06/04/18   Shaune Pollack, MD  B Complex Vitamins (VITAMIN B COMPLEX PO) Take 1 tablet by mouth daily.     [provider]  Cholecalciferol 125 MCG (5000 UT) TABS Take 5,000 Units by mouth daily.     [provider]  Coconut Oil 1000 MG CAPS Take 1,000 mg by mouth every evening.     [provider]  Coenzyme Q10 (COQ-10) 100 MG CAPS Take by mouth every evening.    [provider]  D-MANNOSE PO Take 600 mg by mouth in the morning and at bedtime.    [provider]  enalapril (VASOTEC) 2.5 MG tablet Take 2.5 mg by mouth daily.  04/01/18   [provider]  Ferrous Sulfate (IRON PO) Take 1 tablet by mouth daily.    [provider]  fluticasone (FLONASE) 50 MCG/ACT nasal spray Place 1-2 sprays into both nostrils daily as needed (allergies.).  08/18/19   [provider]  metFORMIN (GLUCOPHAGE-XR) 500 MG 24 hr tablet Take 500 mg by mouth daily. 04/08/18   [provider]  Multiple Vitamin (MULTIVITAMIN WITH MINERALS) TABS tablet Take 1 tablet by mouth every evening. Senior Multivitamin    [provider]  Multiple Vitamins-Minerals (ANTIOXIDANT FORMULA) TABS Take 1 tablet by mouth every evening. Super Antioxidant    [provider]  Omega 3-6-9 Fatty Acids (OMEGA-3-6-9 PO) Take 1 capsule by mouth every evening.    [provider]  omeprazole (PRILOSEC) 20 MG capsule Take 20 mg by mouth every evening.     [provider]  OVER THE COUNTER MEDICATION Take 1 capsule by  mouth daily. Lion's mane Supplement    [provider]  Sodium Fluoride (CLINPRO 5000) 1.1 % PSTE Place 1 application onto teeth in the morning and at bedtime.     [provider]      VITAL SIGNS:  Blood pressure (!) 145/72, pulse (!) 43, temperature 97.7 F (36.5 C), temperature source Oral, resp. rate 18, height 5\' 11"  (1.803 m), weight 81.6 kg, SpO2 99 %.  PHYSICAL EXAMINATION:  Physical Exam  GENERAL:  79 y.o.-year-old Caucasian male patient lying in the bed with no acute distress.  He was somnolent but arousable and cooperative with exam. EYES: Pupils equal, round, reactive to light and accommodation. No scleral icterus. Extraocular muscles intact.  HEENT: Head atraumatic, normocephalic. Oropharynx and nasopharynx clear.  NECK:  Supple, no jugular venous distention. No thyroid enlargement, no tenderness.  LUNGS: Normal breath sounds bilaterally, no wheezing, rales,rhonchi or crepitation. No use of accessory muscles of respiration.  CARDIOVASCULAR:  Regular rate and rhythm, S1, S2 normal. No murmurs, rubs, or gallops.  ABDOMEN: Soft, nondistended, nontender. Bowel sounds present. No organomegaly or mass.  EXTREMITIES: No pedal edema, cyanosis, or clubbing.  NEUROLOGIC: Cranial nerves II through XII are intact. Muscle strength 5/5 in all extremities. Sensation intact. Gait not checked.  PSYCHIATRIC: The patient is alert and oriented x 3.  Normal affect and good eye contact. SKIN: No obvious rash, lesion, or ulcer.   LABORATORY PANEL:   CBC Recent Labs  Lab 09/17/20 1734  WBC 8.2  HGB 13.2  HCT 38.8*  PLT 209   ------------------------------------------------------------------------------------------------------------------  Chemistries  Recent Labs  Lab 09/17/20 1734 09/17/20 2015  NA 142  --   K 3.2*  --   CL 102  --   CO2 31  --   GLUCOSE 159*  --   BUN 18  --   CREATININE 1.33*  --   CALCIUM 9.5  --   AST  --  28  ALT  --  22  ALKPHOS  --   116  BILITOT  --  0.7   ------------------------------------------------------------------------------------------------------------------  Cardiac Enzymes No results for input(s): TROPONINI in the last 168 hours. ------------------------------------------------------------------------------------------------------------------  RADIOLOGY:  CT Head Wo Contrast  Result Date: 09/17/2020 CLINICAL DATA:  Neck trauma.  Syncope. EXAM: CT HEAD WITHOUT CONTRAST CT CERVICAL SPINE WITHOUT CONTRAST TECHNIQUE: Multidetector CT imaging of the head and cervical spine was performed following the standard protocol without intravenous contrast. Multiplanar CT image reconstructions of the cervical spine were also generated. COMPARISON:  Head CT May 31, 2018 FINDINGS: CT HEAD FINDINGS Brain: No subdural, epidural, or subarachnoid hemorrhage. Cerebellum, brainstem, and basal cisterns are normal. Ventricles and sulci are prominent but stable. Mild white matter changes are stable. No acute cortical ischemia or infarct. No mass effect or midline shift. Vascular: No hyperdense vessel or unexpected calcification. Skull: Normal. Negative for fracture or focal lesion. Sinuses/Orbits: No acute finding. Other: None. CT CERVICAL SPINE FINDINGS Alignment: Normal. Skull base and vertebrae: No acute fracture. No primary bone lesion or focal pathologic process. Soft tissues and spinal canal: No prevertebral fluid or swelling. No visible canal hematoma. Disc levels:  Multilevel degenerative changes. Upper chest: Negative. Other: No other abnormalities IMPRESSION: 1. No acute intracranial abnormalities. 2. No fracture or traumatic malalignment in the cervical spine. Significant multilevel degenerative disc disease. Electronically Signed   By: Gerome Samavid  Williams III M.D   On: 09/17/2020 20:49   CT Cervical Spine Wo Contrast  Result Date: 09/17/2020 CLINICAL DATA:  Neck trauma.  Syncope. EXAM: CT HEAD WITHOUT CONTRAST CT CERVICAL SPINE  WITHOUT CONTRAST TECHNIQUE: Multidetector CT imaging of the head and cervical spine was performed following the standard protocol without intravenous contrast. Multiplanar CT image reconstructions of the cervical spine were also generated. COMPARISON:  Head CT May 31, 2018 FINDINGS: CT HEAD FINDINGS Brain: No subdural, epidural, or subarachnoid hemorrhage. Cerebellum, brainstem, and basal cisterns are normal. Ventricles and sulci are prominent but stable. Mild white matter changes are stable. No acute cortical ischemia or infarct. No mass effect or midline shift. Vascular: No hyperdense vessel or unexpected calcification. Skull: Normal. Negative for fracture or focal lesion. Sinuses/Orbits: No acute finding. Other: None. CT CERVICAL SPINE FINDINGS Alignment: Normal. Skull base and vertebrae: No acute fracture. No primary bone lesion or focal pathologic process. Soft tissues and spinal canal: No prevertebral fluid or swelling. No visible canal hematoma. Disc levels:  Multilevel degenerative changes. Upper chest: Negative. Other: No other abnormalities IMPRESSION: 1. No acute  intracranial abnormalities. 2. No fracture or traumatic malalignment in the cervical spine. Significant multilevel degenerative disc disease. Electronically Signed   By: Gerome Sam III M.D   On: 09/17/2020 20:49   DG Chest Portable 1 View  Result Date: 09/17/2020 CLINICAL DATA:  79 year old male with syncope. EXAM: PORTABLE CHEST 1 VIEW COMPARISON:  Chest radiograph dated 05/30/2018. FINDINGS: Left lung base streaky densities, likely atelectasis. No focal consolidation, pleural effusion, pneumothorax. The cardiac silhouette is within limits. No acute osseous pathology. Left scapular fixation hardware. IMPRESSION: No active disease. Electronically Signed   By: Elgie Collard M.D.   On: 09/17/2020 20:56      IMPRESSION AND PLAN:   1.  Transient ischemic attack with associated confusion with previous history of CVA. -The  patient will be admitted to a medically monitored bed. -We will follow neuro checks every 4 hours for 24 hours. -We will place on enteric-coated baby aspirin and continue Eliquis. -We will continue statin therapy and check fasting lipids. -PT/ST and OT consults obtained. -We will obtain neurology consult. -I notified Dr. Iver Nestle about the patient.  2.  Hypokalemia. -Potassium will be replaced and magnesium level will be checked.  3.  Type 2 diabetes mellitus. -The patient will be placed on supplement coverage with NovoLog and will hold off his Metformin given recent CT angiography.  4.  Dyslipidemia. -We will continue statin therapy and check fasting lipids..  5.  Hypertension. -We will hold off enalapril and hydrate with IV normal saline while monitoring renal functions.  6.  DVT prophylaxis. -We will continue Eliquis.   All the records are reviewed and case discussed with ED provider. The plan of care was discussed in details with the patient (and family). I answered all questions. The patient agreed to proceed with the above mentioned plan. Further management will depend upon hospital course.   CODE STATUS: Full code  Status is: Observation  The patient remains OBS appropriate and will d/c before 2 midnights.  Dispo: The patient is from: Home              Anticipated d/c is to: Home              Anticipated d/c date is: 1 day              Patient currently is not medically stable to d/c.  TOTAL TIME TAKING CARE OF THIS PATIENT: 55 minutes.    Hannah Beat M.D on 09/17/2020 at 10:19 PM  [facility]   From 7 PM-7 AM, contact night-coverage [email or web address]  CC: Primary care physician; Sherron Monday, MD

## 2020-09-17 NOTE — ED Provider Notes (Signed)
[facility] Emergency Department Provider Note   ____________________________________________   First MD Initiated Contact with Patient 09/17/20 1951     (approximate)  I have reviewed the triage vital signs and the nursing notes.   HISTORY  Chief Complaint No chief complaint on file.  Chief complaint is fall possible syncope  HPI Alexander Kline. is a 79 y.o. male who comes in after fall at [employer] today.  Patient's daughter gives any part of the history.  Reportedly he came out of [employer] and misstepped and fell landed on his hands and knees did not appear to hit his head.  Patient remembers the fall but not landing.  He could not remember if he passed out or hit his head or not.  Patient's daughter spoke with a man who witnessed the incident.  Patient was walking around in the parking lot looking for his car.  The man reports the patient had also earlier gone into the bathroom and instead of urinating in the urinal urinated in the corner.  Patient himself does not remember this and is very shocked by it.  He has never done anything like this.  Patient's daughter reports he is been a little more confused in the last month than usual.  He has had trouble working the TV remote exam for example.  She says he is acting today as he acted when he had his TIA several years ago.     Past Medical History:  Diagnosis Date  . Hyperlipidemia   . Hypertension   . Pre-diabetes   . Stroke [facility])     Patient Active Problem List   Diagnosis Date Noted  . Abnormal SPEP 06/23/2020  . Essential hypertension 06/23/2020  . Sinus bradycardia 06/22/2020  . Premature ventricular contractions 06/22/2020  . Heart palpitations 06/22/2020  . Dizziness 06/22/2020  . 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 CVA (cerebral vascular accident) (HCC) 05/30/2018  . Muscle strain of thigh 05/15/2018  . Trochanteric bursitis of right hip  02/14/2018  . Hip fracture, unspecified laterality, closed, initial encounter (HCC) 09/28/2017  . Hip fracture (HCC) 09/28/2017    Past Surgical History:  Procedure Laterality Date  . COLONOSCOPY WITH PROPOFOL N/A 01/14/2020   Procedure: COLONOSCOPY WITH PROPOFOL;  Surgeon: Toledo, Boykin Nearing, MD;  Location: [facility] ENDOSCOPY;  Service: Gastroenterology;  Laterality: N/A;  . ESOPHAGOGASTRODUODENOSCOPY (EGD) WITH PROPOFOL N/A 01/14/2020   Procedure: ESOPHAGOGASTRODUODENOSCOPY (EGD) WITH PROPOFOL;  Surgeon: Toledo, Boykin Nearing, MD;  Location: [facility] ENDOSCOPY;  Service: Gastroenterology;  Laterality: N/A;  . HIP ARTHROPLASTY Right 09/29/2017   Procedure: ARTHROPLASTY BIPOLAR HIP (HEMIARTHROPLASTY);  Surgeon: Deeann Saint, MD;  Location: [facility] ORS;  Service: Orthopedics;  Laterality: Right;  . JOINT REPLACEMENT    . TEE WITHOUT CARDIOVERSION N/A 07/26/2018   Procedure: TRANSESOPHAGEAL ECHOCARDIOGRAM (TEE);  Surgeon: Laurier Nancy, MD;  Location: [facility] ORS;  Service: Cardiovascular;  Laterality: N/A;    Prior to Admission medications   Medication Sig Start Date End Date Taking? Authorizing Provider  apixaban (ELIQUIS) 5 MG TABS tablet Take 5 mg by mouth 2 (two) times daily.     [provider]  Apoaequorin (PREVAGEN PO) Take 1 tablet by mouth daily.     [provider]  atorvastatin (LIPITOR) 80 MG tablet Take 1 tablet (80 mg total) by mouth daily. 06/04/18   Shaune Pollack, MD  B Complex Vitamins (VITAMIN B COMPLEX PO) Take 1 tablet by mouth daily.  [provider]  Cholecalciferol 125 MCG (5000 UT) TABS Take 5,000 Units by mouth daily.     [provider]  Coconut Oil 1000 MG CAPS Take 1,000 mg by mouth every evening.     [provider]  Coenzyme Q10 (COQ-10) 100 MG CAPS Take by mouth every evening.    [provider]  D-MANNOSE PO Take 600 mg by mouth in the morning and at bedtime.    [provider]  enalapril (VASOTEC) 2.5 MG tablet Take  2.5 mg by mouth daily.  04/01/18   [provider]  Ferrous Sulfate (IRON PO) Take 1 tablet by mouth daily.    [provider]  fluticasone (FLONASE) 50 MCG/ACT nasal spray Place 1-2 sprays into both nostrils daily as needed (allergies.).  08/18/19   [provider]  metFORMIN (GLUCOPHAGE-XR) 500 MG 24 hr tablet Take 500 mg by mouth daily. 04/08/18   [provider]  Multiple Vitamin (MULTIVITAMIN WITH MINERALS) TABS tablet Take 1 tablet by mouth every evening. Senior Multivitamin    [provider]  Multiple Vitamins-Minerals (ANTIOXIDANT FORMULA) TABS Take 1 tablet by mouth every evening. Super Antioxidant    [provider]  Omega 3-6-9 Fatty Acids (OMEGA-3-6-9 PO) Take 1 capsule by mouth every evening.    [provider]  omeprazole (PRILOSEC) 20 MG capsule Take 20 mg by mouth every evening.     [provider]  OVER THE COUNTER MEDICATION Take 1 capsule by mouth daily. Lion's mane Supplement    [provider]  Sodium Fluoride (CLINPRO 5000) 1.1 % PSTE Place 1 application onto teeth in the morning and at bedtime.     [provider]    Allergies Sulfa antibiotics  Family History  Problem Relation Age of Onset  . CAD Father   . CAD Brother     Social History Social History   Tobacco Use  . Smoking status: Never Smoker  . Smokeless tobacco: Never Used  Vaping Use  . Vaping Use: Never used  Substance Use Topics  . Alcohol use: No    Alcohol/week: 0.0 standard drinks  . Drug use: No    Review of Systems  Constitutional: No fever/chills Eyes: No visual changes. ENT: No sore throat. Cardiovascular: Denies chest pain. Respiratory: Denies shortness of breath. Gastrointestinal: No abdominal pain.  No nausea, no vomiting.  No diarrhea.  No constipation. Genitourinary: Negative for dysuria. Musculoskeletal: Negative for back pain. Skin: Negative for rash. Neurological: Negative for  headaches, focal weakness    ____________________________________________   PHYSICAL EXAM:  VITAL SIGNS: ED Triage Vitals  Enc Vitals Group     BP 09/17/20 1717 (!) 145/72     Pulse Rate 09/17/20 1717 (!) 43     Resp 09/17/20 1717 18     Temp 09/17/20 1717 97.7 F (36.5 C)     Temp Source 09/17/20 1717 Oral     SpO2 09/17/20 1717 99 %     Weight 09/17/20 1726 180 lb (81.6 kg)     Height 09/17/20 1726  (1.803 m)     Head Circumference --      Peak Flow --      Pain Score 09/17/20 1726 0     Pain Loc --      Pain Edu? --      Excl. in GC? --     Constitutional: Alert and oriented. Well appearing and in no acute distress. Eyes: Conjunctivae are normal. PERRL. EOMI. Head: Atraumatic.  Nose: No congestion/rhinnorhea. Mouth/Throat: Mucous membranes are moist.  Oropharynx non-erythematous. Neck: No stridor.  No cervical spine tenderness to palpation. Cardiovascular: Normal rate, regular rhythm. Grossly normal heart sounds.  Good peripheral circulation. Respiratory: Normal respiratory effort.  No retractions. Lungs CTAB. Gastrointestinal: Soft and nontender. No distention. No abdominal bruits.  Musculoskeletal: No lower extremity tenderness nor edema.   Neurologic:  Normal speech and language. No gross focal neurologic deficits are appreciated.  Cranial nerves II through XII are intact although visual fields were not checked cerebellar finger-nose is normal motor strength is 5/5 throughout patient does not report any numbness Skin:  Skin is warm, dry and intact. No rash noted.   ____________________________________________   LABS (all labs ordered are listed, but only abnormal results are displayed)  Labs Reviewed  BASIC METABOLIC PANEL - Abnormal; Notable for the following components:      Result Value   Potassium 3.2 (*)    Glucose, Bld 159 (*)    Creatinine, Ser 1.33 (*)    GFR, Estimated 54 (*)    All other components within normal limits  CBC - Abnormal;  Notable for the following components:   HCT 38.8 (*)    All other components within normal limits  HEPATIC FUNCTION PANEL - Abnormal; Notable for the following components:   Albumin 3.4 (*)    All other components within normal limits  DIFFERENTIAL - Abnormal; Notable for the following components:   Eosinophils Absolute 0.6 (*)    All other components within normal limits  BRAIN NATRIURETIC PEPTIDE  LACTIC ACID, PLASMA  URINALYSIS, COMPLETE (UACMP) WITH MICROSCOPIC  LACTIC ACID, PLASMA  PROCALCITONIN  CBG MONITORING, ED  TROPONIN I (HIGH SENSITIVITY)  TROPONIN I (HIGH SENSITIVITY)   ____________________________________________  EKG  EKG read interpreted by me appears to be atrial flutter with intermittent conduction.  Left axis poor R wave progression and decreased amplitude in the chest leads no obvious acute ST segment elevation. ____________________________________________  RADIOLOGY Jill Poling, personally viewed and evaluated these images (plain radiographs) as part of my medical decision making, as well as reviewing the written report by the radiologist.  ED MD interpretation:    Official radiology report(s): CT Head Wo Contrast  Result Date: 09/17/2020 CLINICAL DATA:  Neck trauma.  Syncope. EXAM: CT HEAD WITHOUT CONTRAST CT CERVICAL SPINE WITHOUT CONTRAST TECHNIQUE: Multidetector CT imaging of the head and cervical spine was performed following the standard protocol without intravenous contrast. Multiplanar CT image reconstructions of the cervical spine were also generated. COMPARISON:  Head CT May 31, 2018 FINDINGS: CT HEAD FINDINGS Brain: No subdural, epidural, or subarachnoid hemorrhage. Cerebellum, brainstem, and basal cisterns are normal. Ventricles and sulci are prominent but stable. Mild white matter changes are stable. No acute cortical ischemia or infarct. No mass effect or midline shift. Vascular: No hyperdense vessel or unexpected calcification. Skull:  Normal. Negative for fracture or focal lesion. Sinuses/Orbits: No acute finding. Other: None. CT CERVICAL SPINE FINDINGS Alignment: Normal. Skull base and vertebrae: No acute fracture. No primary bone lesion or focal pathologic process. Soft tissues and spinal canal: No prevertebral fluid or swelling. No visible canal hematoma. Disc levels:  Multilevel degenerative changes. Upper chest: Negative. Other: No other abnormalities IMPRESSION: 1. No acute intracranial abnormalities. 2. No fracture or traumatic malalignment in the cervical spine. Significant multilevel degenerative disc disease. Electronically Signed   By: Gerome Sam III M.D   On: 09/17/2020 20:49   CT Cervical Spine Wo Contrast  Result Date: 09/17/2020 CLINICAL  DATA:  Neck trauma.  Syncope. EXAM: CT HEAD WITHOUT CONTRAST CT CERVICAL SPINE WITHOUT CONTRAST TECHNIQUE: Multidetector CT imaging of the head and cervical spine was performed following the standard protocol without intravenous contrast. Multiplanar CT image reconstructions of the cervical spine were also generated. COMPARISON:  Head CT May 31, 2018 FINDINGS: CT HEAD FINDINGS Brain: No subdural, epidural, or subarachnoid hemorrhage. Cerebellum, brainstem, and basal cisterns are normal. Ventricles and sulci are prominent but stable. Mild white matter changes are stable. No acute cortical ischemia or infarct. No mass effect or midline shift. Vascular: No hyperdense vessel or unexpected calcification. Skull: Normal. Negative for fracture or focal lesion. Sinuses/Orbits: No acute finding. Other: None. CT CERVICAL SPINE FINDINGS Alignment: Normal. Skull base and vertebrae: No acute fracture. No primary bone lesion or focal pathologic process. Soft tissues and spinal canal: No prevertebral fluid or swelling. No visible canal hematoma. Disc levels:  Multilevel degenerative changes. Upper chest: Negative. Other: No other abnormalities IMPRESSION: 1. No acute intracranial abnormalities. 2. No  fracture or traumatic malalignment in the cervical spine. Significant multilevel degenerative disc disease. Electronically Signed   By: Gerome Sam III M.D   On: 09/17/2020 20:49   DG Chest Portable 1 View  Result Date: 09/17/2020 CLINICAL DATA:  79 year old male with syncope. EXAM: PORTABLE CHEST 1 VIEW COMPARISON:  Chest radiograph dated 05/30/2018. FINDINGS: Left lung base streaky densities, likely atelectasis. No focal consolidation, pleural effusion, pneumothorax. The cardiac silhouette is within limits. No acute osseous pathology. Left scapular fixation hardware. IMPRESSION: No active disease. Electronically Signed   By: Elgie Collard M.D.   On: 09/17/2020 20:56    ____________________________________________   PROCEDURES  Procedure(s) performed (including Critical Care):  Procedures   ____________________________________________   INITIAL IMPRESSION / ASSESSMENT AND PLAN / ED COURSE  Patient with an episode of acute confusion where he urinated in the corner of the bathroom instead of in to one of the receptacles.  He then fell.  Currently he is not altered.  He is bradycardic but this is a known problem of his.  He is not febrile or coughing or hypoxic.  He does have some streaky findings in the base of the lung on chest x-ray.  I have added a procalcitonin to check on this I do not think he is having a lung infection.  Urinalysis has been ordered but he has not been able to supply [place] with urine.  I think in view of his age and the fact that the confusion happened prior to the fall we should watch him at least overnight and further evaluate him.  Possible causes could be seizure or TIA or infection like UTI or pneumonia.  Again I doubt pneumonia but it is possible.  The procalcitonin will help discover the etiology for this.  Additionally he could become more bradycardic but this would have been more likely to result in a fall in the bathroom while he was urinating.               ____________________________________________   FINAL CLINICAL IMPRESSION(S) / ED DIAGNOSES  Final diagnoses:  Altered mental status, unspecified altered mental status type     ED Discharge Orders    None      *Please note:  Alegandro Macnaughton. was evaluated in Emergency Department on 09/17/2020 for the symptoms described in the history of present illness. He was evaluated in the context of the global COVID-19 pandemic, which necessitated consideration that the patient might be at risk for  infection with the SARS-CoV-2 virus that causes COVID-19. Institutional protocols and algorithms that pertain to the evaluation of patients at risk for COVID-19 are in a state of rapid change based on information released by regulatory bodies including the CDC and federal and state organizations. These policies and algorithms were followed during the patient's care in the ED.  Some ED evaluations and interventions may be delayed as a result of limited staffing during and the pandemic.*   Note:  This document was prepared using Dragon voice recognition software and may include unintentional dictation errors.    Arnaldo NatalMalinda, Vyron Fronczak F, MD 09/17/20 2159

## 2020-09-17 NOTE — ED Triage Notes (Signed)
PT to ED via POV. PT had syncopal episode at [employer] today, did not hit head. Denies PC orshob at time of syncope. Duaghter states pt has had decline in last month with confusion andchange of gait. Had CT of head on Thursday that was neg. HX of TIA

## 2020-09-18 ENCOUNTER — Observation Stay
Admit: 2020-09-18 | Discharge: 2020-09-18 | Disposition: A | Discharge status: Home / Self Care | Payer: Medicare Other | Attending: Family Medicine | Admitting: Family Medicine

## 2020-09-18 DIAGNOSIS — R4182 Altered mental status, unspecified: Principal | ICD-10-CM

## 2020-09-18 DIAGNOSIS — E785 Hyperlipidemia, unspecified: Secondary | ICD-10-CM

## 2020-09-18 DIAGNOSIS — I10 Essential (primary) hypertension: Secondary | ICD-10-CM | POA: Diagnosis not present

## 2020-09-18 DIAGNOSIS — G459 Transient cerebral ischemic attack, unspecified: Secondary | ICD-10-CM | POA: Diagnosis not present

## 2020-09-18 LAB — URINE DRUG SCREEN, QUALITATIVE (ARMC ONLY)
Amphetamines, Ur Screen: NOT DETECTED
Barbiturates, Ur Screen: NOT DETECTED
Benzodiazepine, Ur Scrn: NOT DETECTED
Cannabinoid 50 Ng, Ur ~~LOC~~: NOT DETECTED
Cocaine Metabolite,Ur ~~LOC~~: NOT DETECTED
MDMA (Ecstasy)Ur Screen: NOT DETECTED
Methadone Scn, Ur: NOT DETECTED
Opiate, Ur Screen: NOT DETECTED
Phencyclidine (PCP) Ur S: NOT DETECTED
Tricyclic, Ur Screen: NOT DETECTED

## 2020-09-18 LAB — LIPID PANEL
Cholesterol: 120 mg/dL (ref 0–200)
HDL: 30 mg/dL — ABNORMAL LOW (ref >40)
LDL Cholesterol: 77 mg/dL (ref 0–99)
Total CHOL/HDL Ratio: 4 RATIO
Triglycerides: 63 mg/dL (ref <150)
VLDL: 13 mg/dL (ref 0–40)

## 2020-09-18 LAB — TSH: TSH: 1.523 u[IU]/mL (ref 0.350–4.500)

## 2020-09-18 LAB — CBC
HCT: 32.3 % — ABNORMAL LOW (ref 39.0–52.0)
Hemoglobin: 11.1 g/dL — ABNORMAL LOW (ref 13.0–17.0)
MCH: 31.1 pg (ref 26.0–34.0)
MCHC: 34.4 g/dL (ref 30.0–36.0)
MCV: 90.5 fL (ref 80.0–100.0)
Platelets: 167 10*3/uL (ref 150–400)
RBC: 3.57 MIL/uL — ABNORMAL LOW (ref 4.22–5.81)
RDW: 13.7 % (ref 11.5–15.5)
WBC: 8.1 10*3/uL (ref 4.0–10.5)
nRBC: 0 % (ref 0.0–0.2)

## 2020-09-18 LAB — GLUCOSE, CAPILLARY
Glucose-Capillary: 107 mg/dL — ABNORMAL HIGH (ref 70–99)
Glucose-Capillary: 87 mg/dL (ref 70–99)

## 2020-09-18 LAB — RAPID HIV SCREEN (HIV 1/2 AB+AG)
HIV 1/2 Antibodies: NONREACTIVE
HIV-1 P24 Antigen - HIV24: NONREACTIVE

## 2020-09-18 LAB — VITAMIN B12: Vitamin B-12: 680 pg/mL (ref 180–914)

## 2020-09-18 LAB — HEMOGLOBIN A1C
Hgb A1c MFr Bld: 5.7 % — ABNORMAL HIGH (ref 4.8–5.6)
Mean Plasma Glucose: 116.89 mg/dL

## 2020-09-18 MED ORDER — INSULIN ASPART 100 UNIT/ML ~~LOC~~ SOLN: 0.0000 [IU] | Freq: Three times a day (TID) | SUBCUTANEOUS | Status: DC

## 2020-09-18 MED ORDER — INSULIN ASPART 100 UNIT/ML ~~LOC~~ SOLN: 0.0000 [IU] | Freq: Every day | SUBCUTANEOUS | Status: DC

## 2020-09-18 NOTE — ED Notes (Signed)
Patient has given verbal permission for his daughter, Tywon Niday [phone] to receive updates on patient's condition

## 2020-09-18 NOTE — Progress Notes (Signed)
PHARMACIST - PHYSICIAN ORDER COMMUNICATION  CONCERNING: P&T Medication Policy on Herbal Medications  DESCRIPTION:  This patient's order for:  Antioxidant tablets, Apoaequorin, Coconut Oil, Co Q-10 and D-Mannose  has been noted.  This product(s) is classified as an "herbal" or natural product. Due to a lack of definitive safety studies or FDA approval, nonstandard manufacturing practices, plus the potential risk of unknown drug-drug interactions while on inpatient medications, the Pharmacy and Therapeutics Committee does not permit the use of "herbal" or natural products of this type within [facility].   ACTION TAKEN: The pharmacy department is unable to verify this order at this time and your patient has been informed of this safety policy. Please reevaluate patient's clinical condition at discharge and address if the herbal or natural product(s) should be resumed at that time.

## 2020-09-18 NOTE — Discharge Summary (Signed)
[facility] [place] [facility]   PATIENT NAME: Alexander Kline    MR#:  [id number]  DATE OF BIRTH:  02/24/1941  DATE OF ADMISSION:  09/17/2020 ADMITTING PHYSICIAN: Hannah BeatJan A Mansy, MD  DATE OF DISCHARGE: 09/18/20  PRIMARY CARE PHYSICIAN: Sherron Mondayejan-Sie, S Ahmed, MD    ADMISSION DIAGNOSIS:  TIA (transient ischemic attack) [G45.9] Altered mental status, unspecified altered mental status type [R41.82]  DISCHARGE DIAGNOSIS:  TIA with episodes of confusion history of sinus bradycardia SECONDARY DIAGNOSIS:   Past Medical History:  Diagnosis Date  . Hyperlipidemia   . Hypertension   . Pre-diabetes   . Stroke [facility](HCC)     HOSPITAL COURSE:  Alexander NipRaymond Simmer  is a 79 y.o. Caucasian male with a known history of hypertension, dyslipidemia prediabetes and CVA, who presented to the emergency room with the onset of confusion.  The patient was at a restaurant and went to the bathroom.  He urinated in a corner rather than in the urinal.  He then walked out and fell. 1.  Transient ischemic attack with associated confusion with previous history of CVA. -Patient is continued on eliquis. He takes it for chronic anticoagulation due to a fib. -No neural deficit . -Seen by neurology recommends reversible cause of dementia and labs B-1 B12 TSH RPR and HIV. -Unable to obtain EEG over the weekend. Dr. Sherryll BurgerShah will be informed foreclose outpatient EEG monitoring -seen by physical therapy no PT recommendations -MRI brain no acute CVA -these episodes could be related to his symptoms of bradycardia however patient was evaluated by cardiology as outpatient and his insurance company denied pacemaker placement coverage. Patient needs to follow-up with cardiology closely.  2.  Hypokalemia. -Received potassium  3.  Type 2 diabetes mellitus. -Resume home meds  4.  Dyslipidemia. -Continue statins  5.  Hypertension. -Able resume home meds  6.  DVT prophylaxis. - continue Eliquis.  Status is:  Observation  The patient remains OBS appropriate and will d/c before 2 midnights.  Dispo: The patient is from: Home              Anticipated d/c is to: Home              Anticipated d/c date is: 1 day              Patient currently is medically stable to d/c.  Spoke at length with patient and patient's wife. neurology also discussed with patient regarding follow-up outpatient with Dr. Sherryll BurgerShah for EEG. Patient [facility] for his bradycardia to ensure symptoms not really occurring during his episodes of bradycardia and possible hypotension. Holter monitor was done recently as outpatient.  Patient and wife voiced understanding. Will discharge patient to home.     CONSULTS OBTAINED:  Treatment Team:  Gordy CouncilmanBhagat, Srishti L, MD  DRUG ALLERGIES:   Allergies  Allergen Reactions  . Sulfa Antibiotics     Per pt, he gets bad headaches with sulfa drugs    DISCHARGE MEDICATIONS:   Allergies as of 09/18/2020      Reactions   Sulfa Antibiotics    Per pt, he gets bad headaches with sulfa drugs      Medication List    TAKE these medications   Antioxidant Formula Tabs Take 1 tablet by mouth every evening. Super Antioxidant   atorvastatin 80 MG tablet Commonly known as: LIPITOR Take 1 tablet (80 mg total) by mouth daily.   Cholecalciferol 125 MCG (5000 UT) Tabs Take 5,000 Units by  mouth daily.   Clinpro 5000 1.1 % Pste Generic drug: Sodium Fluoride Place 1 application onto teeth in the morning and at bedtime.   Coconut Oil 1000 MG Caps Take 1,000 mg by mouth every evening.   CoQ-10 100 MG Caps Take 1 capsule by mouth every evening.   D-MANNOSE PO Take 600 mg by mouth in the morning and at bedtime.   Eliquis 5 MG Tabs tablet Generic drug: apixaban Take 5 mg by mouth 2 (two) times daily.   enalapril 2.5 MG tablet Commonly known as: VASOTEC Take 2.5 mg by mouth daily.   fluticasone 50 MCG/ACT nasal spray Commonly known as: FLONASE Place 1-2  sprays into both nostrils daily as needed (allergies.).   IRON PO Take 1 tablet by mouth daily.   metFORMIN 500 MG 24 hr tablet Commonly known as: GLUCOPHAGE-XR Take 500 mg by mouth daily.   multivitamin with minerals Tabs tablet Take 1 tablet by mouth every evening. Senior Multivitamin   OMEGA-3-6-9 PO Take 1 capsule by mouth every evening.   omeprazole 20 MG capsule Commonly known as: PRILOSEC Take 20 mg by mouth every evening.   OVER THE COUNTER MEDICATION Take 1 capsule by mouth daily. Lion's mane Supplement   PREVAGEN PO Take 1 tablet by mouth daily.   VITAMIN B COMPLEX PO Take 1 tablet by mouth daily.       If you experience worsening of your admission symptoms, develop shortness of breath, life threatening emergency, suicidal or homicidal thoughts you must seek medical attention immediately by calling 911 or calling your MD immediately  if symptoms less severe.  You Must read complete instructions/literature along with all the possible adverse reactions/side effects for all the Medicines you take and that have been prescribed to you. Take any new Medicines after you have completely understood and accept all the possible adverse reactions/side effects.   Please note  You were cared for by a hospitalist during your hospital stay. If you have any questions about your discharge medications or the care you received while you were in the hospital after you are discharged, you can call the unit and asked to speak with the hospitalist on call if the hospitalist that took care of you is not available. Once you are discharged, your primary care physician will handle any further medical issues. Please note that NO REFILLS for any discharge medications will be authorized once you are discharged, as it is imperative that you return to your primary care physician (or establish a relationship with a primary care physician if you do not have one) for your aftercare needs so that they can  reassess your need for medications and monitor your lab values. Today   SUBJECTIVE   I am ready to go home wife at bedside. No focal neural deficit. VITAL SIGNS:  Blood pressure (!) 170/65, pulse (!) 54, temperature 98.6 F (37 C), resp. rate 18, height 5\' 11"  (1.803 m), weight 95.3 kg, SpO2 98 %.  I/O:    Intake/Output Summary (Last 24 hours) at 09/18/2020 1453 Last data filed at 09/18/2020 1135 Gross per 24 hour  Intake 958.88 ml  Output 100 ml  Net 858.88 ml    PHYSICAL EXAMINATION:  GENERAL:  79 y.o.-year-old patient lying in the bed with no acute distress.  HEENT: Head atraumatic, normocephalic. Oropharynx and nasopharynx clear.  NECK:  Supple, no jugular venous distention. No thyroid enlargement, no tenderness.  LUNGS: Normal breath sounds bilaterally, no wheezing, rales,rhonchi or crepitation. No use of accessory  muscles of respiration.  CARDIOVASCULAR: S1, S2 normal. No murmurs, rubs, or gallops.  ABDOMEN: Soft, non-tender, non-distended. Bowel sounds present. No organomegaly or mass.  EXTREMITIES: No pedal edema, cyanosis, or clubbing.  NEUROLOGIC: Cranial nerves II through XII are intact. Muscle strength 5/5 in all extremities. Sensation intact. Gait not checked.  PSYCHIATRIC: The patient is alert and oriented x 2 SKIN: No obvious rash, lesion, or ulcer.   DATA REVIEW:   CBC  Recent Labs  Lab 09/18/20 0429  WBC 8.1  HGB 11.1*  HCT 32.3*  PLT 167    Chemistries  Recent Labs  Lab 09/17/20 1734 09/17/20 2015  NA 142  --   K 3.2*  --   CL 102  --   CO2 31  --   GLUCOSE 159*  --   BUN 18  --   CREATININE 1.33*  --   CALCIUM 9.5  --   AST  --  28  ALT  --  22  ALKPHOS  --  116  BILITOT  --  0.7    Microbiology Results   Recent Results (from the past 240 hour(s))  Resp Panel by RT-PCR (Flu A&B, Covid) Nasopharyngeal Swab     Status: None   Collection Time: 09/17/20 10:43 PM   Specimen: Nasopharyngeal Swab; Nasopharyngeal(NP) swabs in vial  transport medium  Result Value Ref Range Status   SARS Coronavirus 2 by RT PCR NEGATIVE NEGATIVE Final    Comment: (NOTE) SARS-CoV-2 target nucleic acids are NOT DETECTED.  The SARS-CoV-2 RNA is generally detectable in upper respiratory specimens during the acute phase of infection. The lowest concentration of SARS-CoV-2 viral copies this assay can detect is 138 copies/mL. A negative result does not preclude SARS-Cov-2 infection and should not be used as the sole basis for treatment or other patient management decisions. A negative result may occur with  improper specimen collection/handling, submission of specimen other than nasopharyngeal swab, presence of viral mutation(s) within the areas targeted by this assay, and inadequate number of viral copies(<138 copies/mL). A negative result must be combined with clinical observations, patient history, and epidemiological information. The expected result is Negative.  Fact Sheet for Patients:  BloggerCourse.com  Fact Sheet for Healthcare Providers:  SeriousBroker.it  This test is no t yet approved or cleared by the [place] FDA and  has been authorized for detection and/or diagnosis of SARS-CoV-2 by FDA under an Emergency Use Authorization (EUA). This EUA will remain  in effect (meaning this test can be used) for the duration of the COVID-19 declaration under Section 564(b)(1) of the Act, 21 U.S.C.section 360bbb-3(b)(1), unless the authorization is terminated  or revoked sooner.       Influenza A by PCR NEGATIVE NEGATIVE Final   Influenza B by PCR NEGATIVE NEGATIVE Final    Comment: (NOTE) The Xpert Xpress SARS-CoV-2/FLU/RSV plus assay is intended as an aid in the diagnosis of influenza from Nasopharyngeal swab specimens and should not be used as a sole basis for treatment. Nasal washings and aspirates are unacceptable for Xpert Xpress SARS-CoV-2/FLU/RSV testing.  Fact  Sheet for Patients: BloggerCourse.com  Fact Sheet for Healthcare Providers: SeriousBroker.it  This test is not yet approved or cleared by the [place] FDA and has been authorized for detection and/or diagnosis of SARS-CoV-2 by FDA under an Emergency Use Authorization (EUA). This EUA will remain in effect (meaning this test can be used) for the duration of the COVID-19 declaration under Section 564(b)(1) of the Act, 21 U.S.C. section 360bbb-3(b)(1), unless  the authorization is terminated or revoked.  Performed at [facility], [address], [place], [place] [address]     RADIOLOGY:  CT Head Wo Contrast  Result Date: 09/17/2020 CLINICAL DATA:  Neck trauma.  Syncope. EXAM: CT HEAD WITHOUT CONTRAST CT CERVICAL SPINE WITHOUT CONTRAST TECHNIQUE: Multidetector CT imaging of the head and cervical spine was performed following the standard protocol without intravenous contrast. Multiplanar CT image reconstructions of the cervical spine were also generated. COMPARISON:  Head CT May 31, 2018 FINDINGS: CT HEAD FINDINGS Brain: No subdural, epidural, or subarachnoid hemorrhage. Cerebellum, brainstem, and basal cisterns are normal. Ventricles and sulci are prominent but stable. Mild white matter changes are stable. No acute cortical ischemia or infarct. No mass effect or midline shift. Vascular: No hyperdense vessel or unexpected calcification. Skull: Normal. Negative for fracture or focal lesion. Sinuses/Orbits: No acute finding. Other: None. CT CERVICAL SPINE FINDINGS Alignment: Normal. Skull base and vertebrae: No acute fracture. No primary bone lesion or focal pathologic process. Soft tissues and spinal canal: No prevertebral fluid or swelling. No visible canal hematoma. Disc levels:  Multilevel degenerative changes. Upper chest: Negative. Other: No other abnormalities IMPRESSION: 1. No acute intracranial abnormalities. 2. No fracture or  traumatic malalignment in the cervical spine. Significant multilevel degenerative disc disease. Electronically Signed   By: Gerome Sam III M.D   On: 09/17/2020 20:49   CT Cervical Spine Wo Contrast  Result Date: 09/17/2020 CLINICAL DATA:  Neck trauma.  Syncope. EXAM: CT HEAD WITHOUT CONTRAST CT CERVICAL SPINE WITHOUT CONTRAST TECHNIQUE: Multidetector CT imaging of the head and cervical spine was performed following the standard protocol without intravenous contrast. Multiplanar CT image reconstructions of the cervical spine were also generated. COMPARISON:  Head CT May 31, 2018 FINDINGS: CT HEAD FINDINGS Brain: No subdural, epidural, or subarachnoid hemorrhage. Cerebellum, brainstem, and basal cisterns are normal. Ventricles and sulci are prominent but stable. Mild white matter changes are stable. No acute cortical ischemia or infarct. No mass effect or midline shift. Vascular: No hyperdense vessel or unexpected calcification. Skull: Normal. Negative for fracture or focal lesion. Sinuses/Orbits: No acute finding. Other: None. CT CERVICAL SPINE FINDINGS Alignment: Normal. Skull base and vertebrae: No acute fracture. No primary bone lesion or focal pathologic process. Soft tissues and spinal canal: No prevertebral fluid or swelling. No visible canal hematoma. Disc levels:  Multilevel degenerative changes. Upper chest: Negative. Other: No other abnormalities IMPRESSION: 1. No acute intracranial abnormalities. 2. No fracture or traumatic malalignment in the cervical spine. Significant multilevel degenerative disc disease. Electronically Signed   By: Gerome Sam III M.D   On: 09/17/2020 20:49   MR BRAIN WO CONTRAST  Result Date: 09/18/2020 CLINICAL DATA:  Initial evaluation for acute TIA, confusion. EXAM: MRI HEAD WITHOUT CONTRAST TECHNIQUE: Multiplanar, multiecho pulse sequences of the brain and surrounding structures were obtained without intravenous contrast. COMPARISON:  Prior head CT from  09/17/2020 as well as previous brain MRI from 05/18/2019 FINDINGS: Brain: Generalized age appropriate cerebral atrophy. Mild chronic microvascular ischemic disease present within the periventricular and deep white matter both cerebral hemispheres. No abnormal foci of restricted diffusion to suggest acute or subacute ischemia. Punctate focus of diffusion signal noted at the left periatrial white matter favored to be artifactual (series 5, image 22). Gray-white matter differentiation maintained. No encephalomalacia to suggest chronic cortical infarction. No acute intracranial hemorrhage. Single chronic microhemorrhage noted at the left frontal corona radiata, likely small vessel related. No mass lesion, midline shift or mass effect. No hydrocephalus or  extra-axial fluid collection. Pituitary gland suprasellar region normal. Midline structures intact. Vascular: Major intracranial vascular flow voids are maintained. Skull and upper cervical spine: Craniocervical junction normal. Bone marrow signal intensity within normal limits. No scalp soft tissue abnormality. Sinuses/Orbits: Patient status post bilateral ocular lens replacement. Globes and orbital soft tissues demonstrate no acute finding. Paranasal sinuses are clear. No mastoid effusion. Inner ear structures grossly normal. Other: None. IMPRESSION: 1. No acute intracranial abnormality. 2. Mild chronic microvascular ischemic disease for age. Electronically Signed   By: Rise Mu M.D.   On: 09/18/2020 00:35   US Carotid Bilateral (at [facility] and [facility] only)  Result Date: 09/18/2020 CLINICAL DATA:  TIA symptoms, hypertension, hyperlipidemia and diabetes EXAM: BILATERAL CAROTID DUPLEX ULTRASOUND TECHNIQUE: Wallace Cullens scale imaging, color Doppler and duplex ultrasound were performed of bilateral carotid and vertebral arteries in the neck. COMPARISON:  05/31/2018 FINDINGS: Criteria: Quantification of carotid stenosis is based on velocity parameters that correlate the  residual internal carotid diameter with NASCET-based stenosis levels, using the diameter of the distal internal carotid lumen as the denominator for stenosis measurement. The following velocity measurements were obtained: RIGHT ICA: 63/14 cm/sec CCA: 33/5 cm/sec SYSTOLIC ICA/CCA RATIO:  1.9 ECA: 73 cm/sec LEFT ICA: 79/24 cm/sec CCA: 52/17 cm/sec SYSTOLIC ICA/CCA RATIO:  1.5 ECA: 90 cm/sec RIGHT CAROTID ARTERY: Minor intimal thickening and plaque formation. No hemodynamically significant right ICA stenosis, velocity elevation, or turbulent flow. Degree of narrowing less than 50%. RIGHT VERTEBRAL ARTERY:  Normal antegrade flow LEFT CAROTID ARTERY: Similar scattered minor intimal thickening and plaque formation. No hemodynamically significant left ICA stenosis, velocity elevation, or turbulent flow. LEFT VERTEBRAL ARTERY:  Normal antegrade IMPRESSION: Minor carotid atherosclerosis. No hemodynamically significant ICA stenosis. Degree of narrowing less than 50% bilaterally by ultrasound criteria. Patent antegrade vertebral flow bilaterally Electronically Signed   By: Judie Petit.  Shick M.D.   On: 09/18/2020 09:05   DG Chest Portable 1 View  Result Date: 09/17/2020 CLINICAL DATA:  79 year old male with syncope. EXAM: PORTABLE CHEST 1 VIEW COMPARISON:  Chest radiograph dated 05/30/2018. FINDINGS: Left lung base streaky densities, likely atelectasis. No focal consolidation, pleural effusion, pneumothorax. The cardiac silhouette is within limits. No acute osseous pathology. Left scapular fixation hardware. IMPRESSION: No active disease. Electronically Signed   By: Elgie Collard M.D.   On: 09/17/2020 20:56     CODE STATUS:     Code Status Orders  (From admission, onward)         Start     Ordered   09/17/20 2213  Full code  Continuous        09/17/20 2218        Code Status History    Date Active Date Inactive Code Status Order ID Comments User Context   05/30/2018 1718 06/04/2018 1421 Full Code [id number]   Bertrum Sol, MD Inpatient   09/28/2017 1656 10/02/2017 1650 Full Code [id number]  Altamese Dilling, MD Inpatient   Advance Care Planning Activity       TOTAL TIME TAKING CARE OF THIS PATIENT: *35* minutes.    Enedina Finner M.D  [facility]    CC: Primary care physician; Sherron Monday, MD

## 2020-09-18 NOTE — Consult Note (Addendum)
Neurology Consultation Reason for Consult: Transient confusion  Requesting Physician: Enedina Finner  CC: Confusion, now resolved   History is obtained from: Patient, wife and chart review   HPI: Alexander Kline. is a 79 y.o. male w/ PMHx of atrial fibrillation on apixaban, sinus bradycardia, hypertension, hyperlipidemia, pre-diabetes, and longstanding lightheadedness with exertion and generalized fatigue.  He had a recent 72-hour Holter monitor revealing "predominant sinus bradycardia, mean heart rate 60, range 41-143 with frequent premature atrial contractions and premature ventricular contractions." However insurance denied permanent pacemaker implantation due to negative. (Outpatient cardiologist Marcina Millard, MD PhD [facility], [facility])  Patient provides differing history and chart review, suggestive of minimizing his symptoms.  In brief, he had an episode yesterday where he was leaving a restaurant and had one foot on the sidewalk one foot and undertook and stumbled.  His wife believes that this was a mechanical type of fall due to uneven surface.  However he was confused subsequent to this, peeing in a corner of the restroom instead of the urinal and walking past their car and continuing to look for it, not recognizing it.  He tried to insist on driving but his wife drove instead and brought him to the ED for further evaluation.  Initially he denied remembering the event to the ED provider, but he tells me he clearly remembers walking past the urinal and peeing in the corner and that he denied it mostly because he was embarrassed. He does not understand why he did not pee in the urinal.  Additionally his wife has felt that he has difficulty operating the remote control at times.  He states that they have 2 different controls and that the menus are complicated and sometimes he has trouble going through the menus and has to restart due to the order of button presses that is needed.  She reports  that that some days he has no trouble operating the remotes and other days he has significant trouble.  They also describe an episode where he went into a [employer] and ordered a cake for a friend's 89th birthday.  On return to the [employer], he was slightly confused about which ways to turn and the [employer] did not help the cake when they returned to pick it up 3 days later.  They are not sure whether the [employer] lost the order or if (they think more likely) he might of placed the order at a different [employer] and could not remember which [employer] the order was placed at.  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  He was found to have paroxysmal atrial fibrillation on monitor and started on apixaban presumably at that time.  His mental status changes were felt to be secondary to delirium at that time.  Notably his more recent 3-day heart monitor did not reveal any atrial fibrillation though did have an irregular heart rate due to premature atrial contractions and ventricular contractions  He was last seen by Dr. Cristopher Peru 05/07/2019, at which time gabapentin was started for concern for neuropathy (which patient reports he stopped taking), B12, vitamin D, TSH were ordered though I do not see results,  and an MRI was ordered (generalized brain atrophy with chronic microvascular changes and resolution of previously seen punctate infarct)   Review of systems is notable otherwise for fatigue and exercise intolerance, but he denies headaches, change in vision, change in hearing, issues with speech or swallow, focal weakness or numbness, changes in his bowel or bladder patterns, recent medication changes  LKW: 11/19 ~ 5  PM tPA given?: No, MRI negative and back to baseline  ROS: A 14 point ROS was performed and is negative except as noted in the HPI.   Past Medical History:  Diagnosis Date  . Hyperlipidemia   . Hypertension   . Pre-diabetes   . Stroke [facility])    Past Surgical History:  Procedure Laterality Date  . COLONOSCOPY WITH PROPOFOL N/A 01/14/2020   Procedure: COLONOSCOPY WITH PROPOFOL;  Surgeon: Toledo, Boykin Nearing, MD;  Location: [facility] ENDOSCOPY;  Service: Gastroenterology;  Laterality: N/A;  . ESOPHAGOGASTRODUODENOSCOPY (EGD) WITH PROPOFOL N/A 01/14/2020   Procedure: ESOPHAGOGASTRODUODENOSCOPY (EGD) WITH PROPOFOL;  Surgeon: Toledo, Boykin Nearing, MD;  Location: [facility] ENDOSCOPY;  Service: Gastroenterology;  Laterality: N/A;  . HIP ARTHROPLASTY Right 09/29/2017   Procedure: ARTHROPLASTY BIPOLAR HIP (HEMIARTHROPLASTY);  Surgeon: Deeann Saint, MD;  Location: [facility] ORS;  Service: Orthopedics;  Laterality: Right;  . JOINT REPLACEMENT    . TEE WITHOUT CARDIOVERSION N/A 07/26/2018   Procedure: TRANSESOPHAGEAL ECHOCARDIOGRAM (TEE);  Surgeon: Laurier Nancy, MD;  Location: [facility] ORS;  Service: Cardiovascular;  Laterality: N/A;   Current Meds  Medication Sig  . apixaban (ELIQUIS) 5 MG TABS tablet Take 5 mg by mouth 2 (two) times daily.   Marland Kitchen Apoaequorin (PREVAGEN PO) Take 1 tablet by mouth daily.   Marland Kitchen atorvastatin (LIPITOR) 80 MG tablet Take 1 tablet (80 mg total) by mouth daily.  . B Complex Vitamins (VITAMIN B COMPLEX PO) Take 1 tablet by mouth daily.   . Cholecalciferol 125 MCG (5000 UT) TABS Take 5,000 Units by mouth daily.   . Coconut Oil 1000 MG CAPS Take 1,000 mg by mouth every evening.   . Coenzyme Q10 (COQ-10) 100 MG CAPS Take 1 capsule by mouth every evening.   . D-MANNOSE PO Take 600 mg by mouth in the morning and at bedtime.  . enalapril (VASOTEC) 2.5 MG tablet Take 2.5 mg by mouth daily.   . Ferrous Sulfate (IRON PO) Take 1 tablet by mouth daily.  . fluticasone (FLONASE) 50 MCG/ACT nasal spray Place  1-2 sprays into both nostrils daily as needed (allergies.).   Marland Kitchen metFORMIN (GLUCOPHAGE-XR) 500 MG 24 hr tablet Take 500 mg by mouth daily.  . Multiple Vitamin (MULTIVITAMIN WITH MINERALS) TABS tablet Take 1 tablet by mouth every evening. Senior Multivitamin  . Multiple Vitamins-Minerals (ANTIOXIDANT FORMULA) TABS Take 1 tablet by mouth every evening. Super Antioxidant  . Omega 3-6-9 Fatty Acids (OMEGA-3-6-9 PO) Take 1 capsule by mouth every evening.  Marland Kitchen omeprazole (PRILOSEC) 20 MG capsule Take 20 mg by mouth every evening.   Marland Kitchen OVER THE COUNTER MEDICATION Take 1 capsule by mouth daily. Lion's mane Supplement  . Sodium Fluoride (CLINPRO 5000) 1.1 % PSTE Place 1 application onto teeth in the morning and at bedtime.      Family History  Problem Relation Age of Onset  . CAD Father   . CAD Brother   . Myocardial Infarction (Heart attack) Maternal Grandfather  . Myocardial Infarction (Heart attack) Paternal Grandmother   . Cancer Mother  . Dementia Father  Social History:  reports that he has never smoked. He  has never used smokeless tobacco. He reports that he does not drink alcohol and does not use drugs.   Exam: Current vital signs: BP (!) 154/73 (BP Location: Right Arm)   Pulse (!) 56   Temp 98.4 F (36.9 C)   Resp 17   Ht 5\' 11"  (1.803 m)   Wt 95.3 kg   SpO2 100%   BMI 29.29 kg/m  Vital signs in last 24 hours: Temp:  [97.7 F (36.5 C)-98.6 F (37 C)] 98.4 F (36.9 C) (11/20 0723) Pulse Rate:  [43-92] 56 (11/20 0723) Resp:  [16-18] 17 (11/20 0723) BP: (126-171)/(72-88) 154/73 (11/20 0723) SpO2:  [97 %-100 %] 100 % (11/20 0723) Weight:  [81.6 kg-95.3 kg] 95.3 kg (11/20 0039)  Physical Exam  Constitutional: Appears well-developed and well-nourished, though somewhat pale Psych: Affect appropriate to situation Eyes: No scleral injection HENT: No OP obstruction, fair dentition MSK: no joint deformities.  Cardiovascular: Irregularly irregular rhythm, relatively slow  rate Respiratory: Effort normal, non-labored breathing GI: Soft.  No distension. There is no tenderness.  Skin: WDI  Neuro: Mental Status: Patient is awake, alert, oriented to person, place, month, year, and situation. Patient is able to give a clear and coherent history. No signs of aphasia or neglect Cranial Nerves: II: Visual Fields are full. Pupils are equal, round, and reactive to light.   III,IV, VI: EOMI without ptosis or diploplia. Smooth pursuits  V: Facial sensation is symmetric to temperature VII: Facial movement is symmetric.  VIII: hearing is intact to voice X: Uvula elevates symmetrically XI: Shoulder shrug is symmetric. XII: tongue is midline without atrophy or fasciculations.  Motor: Tone is normal. Bulk is normal. 5/5 strength was present in all four extremities. No decrementing finger or toe taps Sensory: Sensation is symmetric to light touch and temperature in the arms and legs. Deep Tendon Reflexes: 1+ and symmetric in the biceps, could not elicit patellae.  Cerebellar: FNF and HKS are intact bilaterally Gait: Able to rise on heels and toes.  Unable to tandem.  Casual gait is slightly wide-based and he reports some stable baseline pain with ambulation.  Posture is mildly stooped   I have reviewed labs in epic and the results pertinent to this consultation are: UA negative for infection, negative ketanes Mild hypokalemia to 3.2 on admission, Cr 1.33 (stable from August 2021) Lab Results  Component Value Date   HGBA1C 5.7 (H) 09/18/2020    Lab Results  Component Value Date   TSH 1.420 05/30/2018   No results found for: VITAMINB12  Lab Results  Component Value Date   CHOL 120 09/18/2020   HDL 30 (L) 09/18/2020   LDLCALC 77 09/18/2020   TRIG 63 09/18/2020   CHOLHDL 4.0 09/18/2020   CMP     Component Value Date/Time   NA 142 09/17/2020 1734   K 3.2 (L) 09/17/2020 1734   CL 102 09/17/2020 1734   CO2 31 09/17/2020 1734   GLUCOSE 159 (H) 09/17/2020  1734   BUN 18 09/17/2020 1734   CREATININE 1.33 (H) 09/17/2020 1734   CALCIUM 9.5 09/17/2020 1734   PROT 6.6 09/17/2020 2015   ALBUMIN 3.4 (L) 09/17/2020 2015   AST 28 09/17/2020 2015   ALT 22 09/17/2020 2015   ALKPHOS 116 09/17/2020 2015   BILITOT 0.7 09/17/2020 2015   GFRNONAA 54 (L) 09/17/2020 1734   GFRAA 55 (L) 06/23/2020 1131   CBC    Component Value Date/Time   WBC 8.1 09/18/2020 0429   RBC 3.57 (L)  09/18/2020 0429   HGB 11.1 (L) 09/18/2020 0429   HCT 32.3 (L) 09/18/2020 0429   PLT 167 09/18/2020 0429   MCV 90.5 09/18/2020 0429   MCH 31.1 09/18/2020 0429   MCHC 34.4 09/18/2020 0429   RDW 13.7 09/18/2020 0429   LYMPHSABS 2.7 09/17/2020 2015   MONOABS 0.5 09/17/2020 2015   EOSABS 0.6 (H) 09/17/2020 2015   BASOSABS 0.1 09/17/2020 2015     I have reviewed the images obtained  MRI brain 09/17/2020 - Agree with radiology, single microhemorhage, chronic microvacular disease, no acute process  MRI brain 05/30/2018:    ECHO read pending 09/18/2020  ECHO 06/2018: Study Conclusions  - Left ventricle: Systolic function was vigorous. The estimated  ejection fraction was 70%. Wall motion was normal; there were no  regional wall motion abnormalities.  - Aortic valve: There was trivial regurgitation.  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 Advise neurological recomendation with possible closure  percutaneusly.   Impression: This is an 79 year old gentleman with a small VSD, bradycardia, possible atrial fibrillation, presenting with a confusional episode.  At this time it is unclear to me whether his other episodes of confusion are significant or not given his minimization of the symptoms but his wife's concern about them.  Unlikely to be a stroke/TIA given he does not appear to have  significant atherosclerosis that would cause transient global hypoperfusion for example due to basilar artery stenosis (though intracranial vessel imaging was not obtained during this admission, do not feel this would be high yield currently). His remote punctate cerebellar infarct does not explain his confusional episode at that time.  Additionally his stroke work-up here is reassuring (ECHO read still pending but patient already on [facility]).  Possibly seizure but again his ability to recall all the details of the event make this relatively less likely.  I question whether there could be a cardiac etiology given his bradycardia and generalized fatigue.  I will also obtain some metabolic labs to rule out reversible causes of confusion, which can be followed up on an outpatient basis.  Given that he is returned to baseline and he is medically optimized from a stroke perspective and EEG is not readily available inpatient over the weekend, he is most appropriate for close outpatient follow-up.  I will reach out to his outpatient neurologist to request an expedited outpatient EEG  Recommendations: - Reversible causes of dementia labs (B1, B12, TSH, RRP, HIV); holding on ammonia given CMP and CBC reassuring  - Urine tox screen added onto admission urine - Given LDL is over goal of 70 with maximal dose of statin, recommend consideration of PSK 9 inhibitor which would need to be done on an outpatient basis due to prior authorizations required - Unable to obtain EEG over the weekend here at [facility]; will reach out to Dr. Sherryll Burger for close outpatient EEG - If the routine EEG is negative and frequent spells continue, consider EMU stay at [facility] for spell characterization - Patient and wife instructed to check blood pressure and heart rate during these episodes to see if there is potential cerebral hypoperfusion due to cardiac etiology contributing - Patient advised to report this event to the [facility] and  abstain from driving until the episodes have resolved, given risk of accident in case  of confusional episode  Brooke Dare MD-PhD [facility] [phone]

## 2020-09-18 NOTE — Progress Notes (Addendum)
SLP Cancellation Note  Patient Details Name: Alexander Kline. MRN: [id number] DOB: 06/18/1941   Cancelled treatment:       Reason Eval/Treat Not Completed:  (order recieved, chart reviewed) Pt admitted last evening s/p syncopal episode when eating at restaurant, followed by Fall to knees, confusion afterwards. Daughter stated pt has had a decline in status in the last month w/ confusion and change of gait at home. Had CT of Head last Thursday that was Neg; h/o TIA/stroke per notes. Head and C-spine CT scan showed no acute intracranial normalities and no fracture or traumatic malalignment in the cervical spine though showed significant multilevel degenerative disc disease. Chest ray showed no acute cardiopulmonary disease.  MRI revealed "No acute intracranial abnormality; Mild chronic microvascular ischemic disease for age". Met w/ pt and Wife in room. Pt was able to answer basic questions re: self; knew his room number when calling for his breakfast meal. Pt was min vague in his description of details about a TIA/Stroke event he had ~2 years ago. OT noted some reduced orientation to event and holiday next week and noted pt required min cues for follow through w/ instruction. Pt's speech is clear and appropriate.  Wife stated pt has an Appointment w/ his Neurology at [facility] on Dec. 9th. Recommend formal assessment then of Cognitive status w/ recommendation for f/u w/ skilled ST services Outpatient if needs are indicated. Pt/Wife agreed. NSG updated. Pt is on a Regular diet currently post passing the [employer] screening; both pt and Wife denied any difficulty swallowing.      Orinda Kenner, MS, CCC-SLP Speech Language Pathologist Rehab Services [phone] [facility] 09/18/2020, 8:10 AM

## 2020-09-18 NOTE — Discharge Instructions (Signed)
Patient advised not to drive to he gets evaluated by Dr. Sherryll Burger neurology as outpatient

## 2020-09-18 NOTE — Evaluation (Signed)
Occupational Therapy Evaluation Patient Details Name: Alexander Kline. MRN: [id number] DOB: [birth year] Today's Date: 09/18/2020    History of Present Illness 79 y.o. Caucasian male with a known history of hypertension, dyslipidemia prediabetes and CVA, who presented to the emergency room with the onset of confusion.  The patient was at a restaurant and went to the bathroom.  He urinated in a corner rather than in the urinal.  He then walked out and fell.  He then went outside looking for his car.  He has been having slightly worsening confusion over the last couple days and intermittent confusion over the last 3 weeks.  His wife stated that he has been having difficulty with using TV control 1 day and would be able to do it the next day.  His gait has been more shuffled.  He was overall acting like 2 years ago when he had a TIA.  No fever or chills.  No cough or wheezing or chest pain.  No paresthesias or focal muscle weakness.  No dysphagia or dysarthria..   Clinical Impression   Pt performing self care tasks and functional mobility at mod I level - supervision for increased time and safety. Pt did not need AD for mobility. Pt reports being independent and living at home with wife PTA. Pt does live in split level home with steps upstair to bedroom and also down into basement for laundry. Pt is cooperative and agreeable to therapeutic intervention. Pt does need increased time and min cuing for orientation and memory questions this session which caregiver verbalized as being his biggest deficit at this time. Caregiver also agrees that it appears pt is at baseline for physical abilities after seeing him perform tasks with therapist. Pt with no further need for acute OT intervention with no recommendation for follow up at discharge. OT will SIGN OFF. Thank you for referral.     Follow Up Recommendations  No OT follow up;Supervision - Intermittent    Equipment Recommendations  None recommended by OT        Precautions / Restrictions Precautions Precautions: None      Mobility Bed Mobility Overal bed mobility: Independent       Transfers Overall transfer level: Modified independent Equipment used: None         Balance Overall balance assessment: Modified Independent             ADL either performed or assessed with clinical judgement   ADL Overall ADL's : At baseline             Vision Baseline Vision/History: Wears glasses Wears Glasses: At all times Patient Visual Report: No change from baseline           Hand Dominance Right   Extremity/Trunk Assessment Upper Extremity Assessment Upper Extremity Assessment: Overall WFL for tasks assessed   Lower Extremity Assessment Lower Extremity Assessment: Overall WFL for tasks assessed       Communication Communication Communication: No difficulties   Cognition Arousal/Alertness: Awake/alert Behavior During Therapy: WFL for tasks assessed/performed Overall Cognitive Status: Impaired/Different from baseline Area of Impairment: Orientation;Memory       Orientation Level: Disoriented to;Time      General Comments: Pt with difficulty verbalizing correct date and upcoming holiday. Pt needing increased time for processing              Home Living Family/patient expects to be discharged to:: Private residence Living Arrangements: Spouse/significant other Available Help at Discharge: Family;Available 24 hours/day Type of Home:  House Home Access: Stairs to enter [employer] of Steps: 4 STE from garage   Home Layout: Two level;Other (Comment) (split level home) Alternate Level Stairs-Number of Steps: Pt has 8-12 steps upstairs to bedrooms, middle level is kitchen and living area and then 8-12 steps downstairs for laundry in basement Alternate Level Stairs-Rails: Right;Left Bathroom Shower/Tub: Chief Strategy Officer: Standard     Home Equipment: None          Prior  Functioning/Environment Level of Independence: Independent        Comments: Pt reports independent without use of AD. Does report he has been seeing outpt PT for strengthening.                 OT Goals(Current goals can be found in the care plan section) Acute Rehab OT Goals Patient Stated Goal: to go home OT Goal Formulation: With patient/family  OT Frequency:     Barriers to D/C:    none known at this time          AM-PAC OT "6 Clicks" Daily Activity     Outcome Measure Help from another person eating meals?: None Help from another person taking care of personal grooming?: None Help from another person toileting, which includes using toliet, bedpan, or urinal?: None Help from another person bathing (including washing, rinsing, drying)?: None Help from another person to put on and taking off regular upper body clothing?: None Help from another person to put on and taking off regular lower body clothing?: None 6 Click Score: 24   End of Session Nurse Communication: Mobility status  Activity Tolerance: Patient tolerated treatment well Patient left: in bed;with call [place]/phone within reach;with family/visitor present                   Time: 2751-7001 OT Time Calculation (min): 26 min Charges:  OT General Charges $OT Visit: 1 Visit OT Evaluation $OT Eval Low Complexity: 1 Low OT Treatments $Self Care/Home Management : 8-22 mins  Jackquline Denmark, MS, OTR/L , CBIS ascom [phone]  09/18/20, 10:20 AM

## 2020-09-18 NOTE — Evaluation (Signed)
Physical Therapy Evaluation Patient Details Name: Alexander Kline. MRN: [id number] DOB: 1941/06/14 Today's Date: 09/18/2020   History of Present Illness  79 y.o. Caucasian male with a known history of hypertension, dyslipidemia prediabetes and CVA, who presented to the emergency room with the onset of confusion.  The patient was at a restaurant and went to the bathroom.  He urinated in a corner rather than in the urinal.  He then walked out and fell.  He then went outside looking for his car.  He has been having slightly worsening confusion over the last couple days and intermittent confusion over the last 3 weeks.  His wife stated that he has been having difficulty with using TV control 1 day and would be able to do it the next day.  His gait has been more shuffled.  He was overall acting like 2 years ago when he had a TIA.  No fever or chills.  No cough or wheezing or chest pain.  No paresthesias or focal muscle weakness.  No dysphagia or dysarthria..  Clinical Impression  Pt is pleasant & agreeable to tx. PTA pt was independent without AD and recently completed OPPT for R groin pain ~3 weeks ago but states pain worsened after completing PT & plans to return to OPPT. Pt currently ambulates increased distances without AD & supervision with decreased weight shift R & pt with increased pain with mobility. Pt declines using RW or other AD at this time and wife states "I don't think he needs one right now". Pt does not have any current acute PT needs but encourages pt to return to OPPT as he has already planned.     Follow Up Recommendations Outpatient PT (No acute PT needs, pt plans to return to OPPT for R groin pain management & this PT feels this is appropriate)    Equipment Recommendations  None recommended by PT    Recommendations for Other Services   No further acute PT needs at this time.     Precautions / Restrictions Precautions Precautions: None Restrictions Weight Bearing  Restrictions: No      Mobility  Bed Mobility Overal bed mobility: Independent               Patient Response: Impulsive  Transfers Overall transfer level: Modified independent Equipment used: None                Ambulation/Gait Ambulation/Gait assistance: Supervision Gait Distance (Feet): 500 Feet Assistive device: None       General Gait Details: decreased weight shift R - pt's wife reports this is pt's normal gait pattern  Stairs            Wheelchair Mobility    Modified Rankin (Stroke Patients Only)       Balance Overall balance assessment: Needs assistance;Mild deficits observed, not formally tested Sitting-balance support: Feet supported Sitting balance-Leahy Scale: Good       Standing balance-Leahy Scale: Fair Standing balance comment: gait without AD without LOB                             Pertinent Vitals/Pain Pain Assessment: 0-10 Pain Score: 3  Pain Location: R groin with mobility Pain Descriptors / Indicators: Sore Pain Intervention(s): Monitored during session (pt declines asking nurse for pain  medication)    Home Living Family/patient expects to be discharged to:: Private residence Living Arrangements: Spouse/significant other Available Help at Discharge: Family;Available 24 hours/day  Type of Home: House Home Access: Stairs to enter Entrance Stairs-Rails: Left Entrance Stairs-Number of Steps: 4 STE from garage Home Layout: Two level;Other (Comment) (split level home) Home Equipment: Cane - single point      Prior Function Level of Independence: Independent         Comments: Pt reports independent without use of AD. Reports finishing OPPT ~3 weeks ago but plans to return 2/2 R groin pain returning.     Hand Dominance   Dominant Hand: Right    Extremity/Trunk Assessment   Upper Extremity Assessment Upper Extremity Assessment: Overall WFL for tasks assessed    Lower Extremity Assessment Lower  Extremity Assessment: Overall WFL for tasks assessed (Pt performs BLE heel to shin, BLE equal but with decreased ROM)       Communication   Communication: No difficulties  Cognition Arousal/Alertness: Awake/alert Behavior During Therapy: WFL for tasks assessed/performed Overall Cognitive Status: Impaired/Different from baseline (Pt AxO x 4 but wife reports fluctuating confusion. Pt able to report current month & year) Area of Impairment: Orientation;Memory                                    General Comments      Exercises     Assessment/Plan    PT Assessment  (No current PT needs; pt plans to return to OPPT for R groin pain management which is appropriate)  PT Problem List Pain;Decreased balance       PT Treatment Interventions      PT Goals (Current goals can be found in the Care Plan section)  Acute Rehab PT Goals Patient Stated Goal: to go home PT Goal Formulation: With patient Time For Goal Achievement: 10/02/20 Potential to Achieve Goals: Good    Frequency     Barriers to discharge        Co-evaluation               AM-PAC PT "6 Clicks" Mobility  Outcome Measure Help needed turning from your back to your side while in a flat bed without using bedrails?: None Help needed moving from lying on your back to sitting on the side of a flat bed without using bedrails?: None Help needed moving to and from a bed to a chair (including a wheelchair)?: None Help needed standing up from a chair using your arms (e.g., wheelchair or bedside chair)?: None Help needed to walk in hospital room?: None Help needed climbing 3-5 steps with a railing? : A Little 6 Click Score: 23    End of Session Equipment Utilized During Treatment: Gait belt Activity Tolerance: Patient tolerated treatment well Patient left: in bed;with bed alarm set;with call [place]/phone within reach;with family/visitor present Nurse Communication: Mobility status PT Visit Diagnosis:  Pain;Other abnormalities of gait and mobility (R26.89) Pain - Right/Left: Right Pain - part of body: Leg    Time: 1205-1223 PT Time Calculation (min) (ACUTE ONLY): 18 min   Charges:   PT Evaluation $PT Eval Low Complexity: 1 Low          Aleda Grana, PT, DPT 09/18/20, 12:45 PM   Sandi Mariscal 09/18/2020, 12:44 PM

## 2020-09-18 NOTE — Progress Notes (Signed)
Patient is being discharged home.  Discharge papers given and explained to patient and spouse, both verbalized understanding.  Meds and f/u appointments reviewed.  No Rx at this time.

## 2020-09-18 NOTE — Progress Notes (Signed)
*  PRELIMINARY RESULTS* Echocardiogram 2D Echocardiogram has been performed.  Alexander Kline 09/18/2020, 10:43 AM

## 2020-09-19 LAB — RPR: RPR Ser Ql: NONREACTIVE

## 2020-09-20 LAB — ECHOCARDIOGRAM COMPLETE
AR max vel: 1.57 cm2
AV Area VTI: 1.63 cm2
AV Area mean vel: 1.54 cm2
AV Mean grad: 5.7 mmHg
AV Peak grad: 10.4 mmHg
Ao pk vel: 1.61 m/s
Area-P 1/2: 2.87 cm2
Height: 71 in
S' Lateral: 3.18 cm
Weight: 3360 oz

## 2020-09-24 ENCOUNTER — Inpatient Hospital Stay
Admission: EM | Admit: 2020-09-24 | Discharge: 2020-09-29 | DRG: 243 | Disposition: A | Discharge status: Home / Self Care | Payer: Medicare Other | Attending: Internal Medicine | Admitting: Internal Medicine

## 2020-09-24 ENCOUNTER — Other Ambulatory Visit: Payer: Self-pay

## 2020-09-24 ENCOUNTER — Emergency Department: Payer: Medicare Other

## 2020-09-24 DIAGNOSIS — I130 Hypertensive heart and chronic kidney disease with heart failure and stage 1 through stage 4 chronic kidney disease, or unspecified chronic kidney disease: Secondary | ICD-10-CM | POA: Diagnosis present

## 2020-09-24 DIAGNOSIS — Z7984 Long term (current) use of oral hypoglycemic drugs: Secondary | ICD-10-CM

## 2020-09-24 DIAGNOSIS — R7303 Prediabetes: Secondary | ICD-10-CM | POA: Diagnosis present

## 2020-09-24 DIAGNOSIS — R54 Age-related physical debility: Secondary | ICD-10-CM | POA: Diagnosis present

## 2020-09-24 DIAGNOSIS — Z8249 Family history of ischemic heart disease and other diseases of the circulatory system: Secondary | ICD-10-CM

## 2020-09-24 DIAGNOSIS — I441 Atrioventricular block, second degree: Secondary | ICD-10-CM | POA: Diagnosis not present

## 2020-09-24 DIAGNOSIS — R509 Fever, unspecified: Secondary | ICD-10-CM | POA: Insufficient documentation

## 2020-09-24 DIAGNOSIS — Z20822 Contact with and (suspected) exposure to covid-19: Secondary | ICD-10-CM | POA: Diagnosis present

## 2020-09-24 DIAGNOSIS — N1831 Chronic kidney disease, stage 3a: Secondary | ICD-10-CM | POA: Diagnosis present

## 2020-09-24 DIAGNOSIS — E1122 Type 2 diabetes mellitus with diabetic chronic kidney disease: Secondary | ICD-10-CM | POA: Diagnosis present

## 2020-09-24 DIAGNOSIS — Z8673 Personal history of transient ischemic attack (TIA), and cerebral infarction without residual deficits: Secondary | ICD-10-CM

## 2020-09-24 DIAGNOSIS — I5032 Chronic diastolic (congestive) heart failure: Secondary | ICD-10-CM | POA: Diagnosis present

## 2020-09-24 DIAGNOSIS — E785 Hyperlipidemia, unspecified: Secondary | ICD-10-CM | POA: Diagnosis present

## 2020-09-24 DIAGNOSIS — R001 Bradycardia, unspecified: Secondary | ICD-10-CM | POA: Diagnosis present

## 2020-09-24 DIAGNOSIS — E441 Mild protein-calorie malnutrition: Secondary | ICD-10-CM | POA: Diagnosis present

## 2020-09-24 DIAGNOSIS — I10 Essential (primary) hypertension: Secondary | ICD-10-CM | POA: Diagnosis present

## 2020-09-24 DIAGNOSIS — R531 Weakness: Secondary | ICD-10-CM

## 2020-09-24 DIAGNOSIS — I480 Paroxysmal atrial fibrillation: Secondary | ICD-10-CM | POA: Diagnosis present

## 2020-09-24 DIAGNOSIS — Z79899 Other long term (current) drug therapy: Secondary | ICD-10-CM

## 2020-09-24 DIAGNOSIS — Z6833 Body mass index (BMI) 33.0-33.9, adult: Secondary | ICD-10-CM

## 2020-09-24 DIAGNOSIS — Z882 Allergy status to sulfonamides status: Secondary | ICD-10-CM

## 2020-09-24 DIAGNOSIS — Z950 Presence of cardiac pacemaker: Secondary | ICD-10-CM

## 2020-09-24 DIAGNOSIS — Z7901 Long term (current) use of anticoagulants: Secondary | ICD-10-CM

## 2020-09-24 DIAGNOSIS — N179 Acute kidney failure, unspecified: Secondary | ICD-10-CM | POA: Diagnosis present

## 2020-09-24 DIAGNOSIS — I5189 Other ill-defined heart diseases: Secondary | ICD-10-CM

## 2020-09-24 DIAGNOSIS — E876 Hypokalemia: Secondary | ICD-10-CM | POA: Diagnosis present

## 2020-09-24 HISTORY — DX: Other ill-defined heart diseases: I51.89

## 2020-09-24 LAB — CBC WITH DIFFERENTIAL/PLATELET
Abs Immature Granulocytes: 0.02 10*3/uL (ref 0.00–0.07)
Basophils Absolute: 0 10*3/uL (ref 0.0–0.1)
Basophils Relative: 1 %
Eosinophils Absolute: 0.2 10*3/uL (ref 0.0–0.5)
Eosinophils Relative: 3 %
HCT: 38.6 % — ABNORMAL LOW (ref 39.0–52.0)
Hemoglobin: 13.1 g/dL (ref 13.0–17.0)
Immature Granulocytes: 0 %
Lymphocytes Relative: 11 %
Lymphs Abs: 1 10*3/uL (ref 0.7–4.0)
MCH: 30.8 pg (ref 26.0–34.0)
MCHC: 33.9 g/dL (ref 30.0–36.0)
MCV: 90.6 fL (ref 80.0–100.0)
Monocytes Absolute: 0.4 10*3/uL (ref 0.1–1.0)
Monocytes Relative: 5 %
Neutro Abs: 7 10*3/uL (ref 1.7–7.7)
Neutrophils Relative %: 80 %
Platelets: 206 10*3/uL (ref 150–400)
RBC: 4.26 MIL/uL (ref 4.22–5.81)
RDW: 13.9 % (ref 11.5–15.5)
WBC: 8.6 10*3/uL (ref 4.0–10.5)
nRBC: 0 % (ref 0.0–0.2)

## 2020-09-24 LAB — COMPREHENSIVE METABOLIC PANEL
ALT: 24 U/L (ref 0–44)
AST: 31 U/L (ref 15–41)
Albumin: 3.4 g/dL — ABNORMAL LOW (ref 3.5–5.0)
Alkaline Phosphatase: 118 U/L (ref 38–126)
Anion gap: 10 (ref 5–15)
BUN: 15 mg/dL (ref 8–23)
CO2: 24 mmol/L (ref 22–32)
Calcium: 9 mg/dL (ref 8.9–10.3)
Chloride: 102 mmol/L (ref 98–111)
Creatinine, Ser: 1.4 mg/dL — ABNORMAL HIGH (ref 0.61–1.24)
GFR, Estimated: 51 mL/min — ABNORMAL LOW (ref >60)
Glucose, Bld: 207 mg/dL — ABNORMAL HIGH (ref 70–99)
Potassium: 3.4 mmol/L — ABNORMAL LOW (ref 3.5–5.1)
Sodium: 136 mmol/L (ref 135–145)
Total Bilirubin: 0.9 mg/dL (ref 0.3–1.2)
Total Protein: 6.7 g/dL (ref 6.5–8.1)

## 2020-09-24 LAB — PROTIME-INR
INR: 1.3 — ABNORMAL HIGH (ref 0.8–1.2)
Prothrombin Time: 15.2 seconds (ref 11.4–15.2)

## 2020-09-24 LAB — URINALYSIS, COMPLETE (UACMP) WITH MICROSCOPIC
Bilirubin Urine: NEGATIVE
Glucose, UA: NEGATIVE mg/dL
Hgb urine dipstick: NEGATIVE
Ketones, ur: NEGATIVE mg/dL
Leukocytes,Ua: NEGATIVE
Nitrite: NEGATIVE
Protein, ur: NEGATIVE mg/dL
Specific Gravity, Urine: 1.015 (ref 1.005–1.030)
pH: 5 (ref 5.0–8.0)

## 2020-09-24 LAB — RESP PANEL BY RT-PCR (FLU A&B, COVID) ARPGX2
Influenza A by PCR: NEGATIVE
Influenza B by PCR: NEGATIVE
SARS Coronavirus 2 by RT PCR: NEGATIVE

## 2020-09-24 LAB — VITAMIN B1: Vitamin B1 (Thiamine): 220.9 nmol/L — ABNORMAL HIGH (ref 66.5–200.0)

## 2020-09-24 LAB — LACTIC ACID, PLASMA
Lactic Acid, Venous: 2.7 mmol/L (ref 0.5–1.9)
Lactic Acid, Venous: 1.4 mmol/L (ref 0.5–1.9)

## 2020-09-24 LAB — APTT: aPTT: 35 seconds (ref 24–36)

## 2020-09-24 LAB — PHOSPHORUS: Phosphorus: 2 mg/dL — ABNORMAL LOW (ref 2.5–4.6)

## 2020-09-24 LAB — MAGNESIUM: Magnesium: 1.8 mg/dL (ref 1.7–2.4)

## 2020-09-24 LAB — CBG MONITORING, ED: Glucose-Capillary: 82 mg/dL (ref 70–99)

## 2020-09-24 LAB — PROCALCITONIN: Procalcitonin: <0.1 ng/mL

## 2020-09-24 MED ORDER — VANCOMYCIN HCL IN DEXTROSE 1-5 GM/200ML-% IV SOLN: 1000.0000 mg | Freq: Once | INTRAVENOUS | Status: DC

## 2020-09-24 MED ORDER — APIXABAN 5 MG PO TABS
5.0000 mg | ORAL_TABLET | Freq: Two times a day (BID) | ORAL | Status: DC
  Administered 2020-09-24 – 2020-09-26 (×4): 5 mg via ORAL
  Filled 2020-09-24 (×4): qty 1

## 2020-09-24 MED ORDER — ACETAMINOPHEN 325 MG PO TABS
650.0000 mg | ORAL_TABLET | Freq: Four times a day (QID) | ORAL | Status: DC | PRN
  Administered 2020-09-25 – 2020-09-29 (×3): 650 mg via ORAL
  Filled 2020-09-24 (×2): qty 2

## 2020-09-24 MED ORDER — SODIUM CHLORIDE 0.9 % IV SOLN
2.0000 g | Freq: Two times a day (BID) | INTRAVENOUS | Status: DC
  Administered 2020-09-25: 2 g via INTRAVENOUS
  Filled 2020-09-24 (×4): qty 2

## 2020-09-24 MED ORDER — ONDANSETRON HCL 4 MG/2ML IJ SOLN: 4.0000 mg | Freq: Four times a day (QID) | INTRAMUSCULAR | Status: DC | PRN

## 2020-09-24 MED ORDER — METFORMIN HCL ER 500 MG PO TB24: 500.0000 mg | ORAL_TABLET | Freq: Every day | ORAL | Status: DC

## 2020-09-24 MED ORDER — ENALAPRIL MALEATE 2.5 MG PO TABS: 2.5000 mg | ORAL_TABLET | Freq: Every day | ORAL | Status: DC

## 2020-09-24 MED ORDER — SODIUM CHLORIDE 0.9 % IV SOLN
2.0000 g | Freq: Once | INTRAVENOUS | Status: AC
  Administered 2020-09-24: 2 g via INTRAVENOUS
  Filled 2020-09-24: qty 2

## 2020-09-24 MED ORDER — ENALAPRIL MALEATE 2.5 MG PO TABS
2.5000 mg | ORAL_TABLET | Freq: Every day | ORAL | Status: DC
  Administered 2020-09-25 – 2020-09-29 (×4): 2.5 mg via ORAL
  Filled 2020-09-24 (×5): qty 1

## 2020-09-24 MED ORDER — APIXABAN 5 MG PO TABS: 5.0000 mg | ORAL_TABLET | Freq: Two times a day (BID) | ORAL | Status: DC

## 2020-09-24 MED ORDER — METRONIDAZOLE 500 MG PO TABS
500.0000 mg | ORAL_TABLET | Freq: Three times a day (TID) | ORAL | Status: DC
  Administered 2020-09-24 – 2020-09-26 (×6): 500 mg via ORAL
  Filled 2020-09-24 (×8): qty 1

## 2020-09-24 MED ORDER — SODIUM FLUORIDE 1.1 % DT PSTE: 1.0000 "application " | PASTE | Freq: Two times a day (BID) | DENTAL | Status: DC

## 2020-09-24 MED ORDER — METFORMIN HCL ER 500 MG PO TB24
500.0000 mg | ORAL_TABLET | Freq: Every day | ORAL | Status: DC
  Filled 2020-09-24: qty 1

## 2020-09-24 MED ORDER — POTASSIUM CHLORIDE IN NACL 20-0.9 MEQ/L-% IV SOLN
INTRAVENOUS | Status: DC
  Filled 2020-09-24 (×3): qty 1000

## 2020-09-24 MED ORDER — INSULIN ASPART 100 UNIT/ML ~~LOC~~ SOLN: 0.0000 [IU] | Freq: Three times a day (TID) | SUBCUTANEOUS | Status: DC

## 2020-09-24 MED ORDER — SODIUM CHLORIDE 0.9 % IV BOLUS (SEPSIS)
1000.0000 mL | Freq: Once | INTRAVENOUS | Status: AC
  Administered 2020-09-24: 1000 mL via INTRAVENOUS

## 2020-09-24 MED ORDER — ENOXAPARIN SODIUM 40 MG/0.4ML ~~LOC~~ SOLN: 40.0000 mg | SUBCUTANEOUS | Status: DC

## 2020-09-24 MED ORDER — VANCOMYCIN HCL IN DEXTROSE 1-5 GM/200ML-% IV SOLN
1000.0000 mg | Freq: Once | INTRAVENOUS | Status: AC
  Administered 2020-09-24: 1000 mg via INTRAVENOUS
  Filled 2020-09-24: qty 200

## 2020-09-24 MED ORDER — VANCOMYCIN HCL 1500 MG/300ML IV SOLN
1500.0000 mg | Freq: Once | INTRAVENOUS | Status: AC
  Administered 2020-09-24: 1500 mg via INTRAVENOUS
  Filled 2020-09-24: qty 300

## 2020-09-24 MED ORDER — VITAMIN D3 25 MCG (1000 UNIT) PO TABS
5000.0000 [IU] | ORAL_TABLET | Freq: Every day | ORAL | Status: DC
  Administered 2020-09-25 – 2020-09-29 (×4): 5000 [IU] via ORAL
  Filled 2020-09-24 (×9): qty 5

## 2020-09-24 MED ORDER — PANTOPRAZOLE SODIUM 40 MG PO TBEC
40.0000 mg | DELAYED_RELEASE_TABLET | Freq: Every day | ORAL | Status: DC
  Administered 2020-09-24 – 2020-09-29 (×5): 40 mg via ORAL
  Filled 2020-09-24 (×5): qty 1

## 2020-09-24 MED ORDER — ATORVASTATIN CALCIUM 20 MG PO TABS
80.0000 mg | ORAL_TABLET | Freq: Every day | ORAL | Status: DC
  Administered 2020-09-24 – 2020-09-29 (×5): 80 mg via ORAL
  Filled 2020-09-24 (×5): qty 4

## 2020-09-24 MED ORDER — COQ-10 100 MG PO CAPS: 1.0000 | ORAL_CAPSULE | Freq: Every evening | ORAL | Status: DC

## 2020-09-24 MED ORDER — ONDANSETRON HCL 4 MG PO TABS: 4.0000 mg | ORAL_TABLET | Freq: Four times a day (QID) | ORAL | Status: DC | PRN

## 2020-09-24 MED ORDER — COCONUT OIL 1000 MG PO CAPS: 1000.0000 mg | ORAL_CAPSULE | Freq: Every evening | ORAL | Status: DC

## 2020-09-24 MED ORDER — D-MANNOSE 500 MG PO CAPS: 600.0000 mg | ORAL_CAPSULE | Freq: Every day | ORAL | Status: DC

## 2020-09-24 MED ORDER — ACETAMINOPHEN 650 MG RE SUPP: 650.0000 mg | Freq: Four times a day (QID) | RECTAL | Status: DC | PRN

## 2020-09-24 NOTE — Progress Notes (Signed)
CODE SEPSIS - PHARMACY COMMUNICATION  **Broad Spectrum Antibiotics should be administered within 1 hour of Sepsis diagnosis**  Time Code Sepsis Called/Page Received: 1138  Antibiotics Ordered: Cefepime and Vancomycin  Time of 1st antibiotic administration: 1209  Otelia Sergeant, PharmD, MBA 09/24/2020 12:12 PM

## 2020-09-24 NOTE — ED Notes (Signed)
Attempted to call report x2, unable. Informed pt will be coming up

## 2020-09-24 NOTE — ED Notes (Signed)
Pt assisted with urinal

## 2020-09-24 NOTE — ED Triage Notes (Signed)
Pt to ED via ems for chief complaint of weakness x2 days and ams per family. Family reports recent fall, pt takes blood thinners.  Pt oriented x4, denies pain. NAD noted

## 2020-09-24 NOTE — ED Notes (Signed)
Pharmacy contacted to verify meds

## 2020-09-24 NOTE — Progress Notes (Signed)
Pharmacy Antibiotic Note  Alexander Kline. is a 79 y.o. male admitted on 09/24/2020 with sepsis from unknown source.  Pharmacy has been consulted for Cefepime dosing.  Pt given Cefepime 2 gm once at 1209 today. SCr 1.4, CrCl 52.9, BCx & UCx Pending  Plan: Ordered Cefepime 2mg  q12hr starting 11/27 @ 0000.  Pharmacy will continue to follow SCr and monitor for culture resutls.  Height: 5\' 11"  (180.3 cm) Weight: 109 kg (240 lb 4.8 oz) IBW/kg (Calculated) : 75.3  Temp (24hrs), Avg:99.7 F (37.6 C), Min:99 F (37.2 C), Max:100.4 F (38 C)  Recent Labs  Lab 09/17/20 1734 09/17/20 2015 09/18/20 0429 09/24/20 1134 09/24/20 1259  WBC 8.2  --  8.1 8.6  --   CREATININE 1.33*  --   --  1.40*  --   LATICACIDVEN  --  1.2  --  2.7* 1.4    Estimated Creatinine Clearance: 52.9 mL/min (A) (by C-G formula based on SCr of 1.4 mg/dL (H)).    Allergies  Allergen Reactions  . Sulfa Antibiotics     Per pt, he gets bad headaches with sulfa drugs    Antimicrobials this admission: 11/26 Vancomycin x 1 11/26 Cefepime >>   Microbiology results: 11/26 BCx: Pending 11/26 UCx: Pending   Thank you for allowing pharmacy to be a part of this patient's care.  12/26, PharmD, [facility] 09/24/2020 2:43 PM

## 2020-09-24 NOTE — ED Notes (Signed)
Attempted to call floor with report, RN to call back

## 2020-09-24 NOTE — Progress Notes (Signed)
PHARMACY -  BRIEF ANTIBIOTIC NOTE   Pharmacy has received consult(s) for Cefepime and Vancomycin from an ED provider for code sepsis (unknown source).  The patient's profile has been reviewed for ht/wt/allergies/indication/available labs.  One time order(s) placed for Cefepime 2 gm and total loading dose of Vancomycin of 2500 mg (TBW 109kg).  Further antibiotics/pharmacy consults should be ordered by admitting physician if indicated.                       Thank you, Otelia Sergeant, PharmD, [facility] 09/24/2020 11:52 AM

## 2020-09-24 NOTE — ED Notes (Signed)
Pt to CT

## 2020-09-24 NOTE — Progress Notes (Signed)
PHARMACIST - PHYSICIAN ORDER COMMUNICATION  CONCERNING: P&T Medication Policy on Herbal Medications  DESCRIPTION:  This patient's order for:  Coconut oil, CoQ-10, D-mannose has been noted.  This product(s) is classified as an "herbal" or natural product. Due to a lack of definitive safety studies or FDA approval, nonstandard manufacturing practices, plus the potential risk of unknown drug-drug interactions while on inpatient medications, the Pharmacy and Therapeutics Committee does not permit the use of "herbal" or natural products of this type within [facility].   ACTION TAKEN: The pharmacy department is unable to verify this order at this time and your patient has been informed of this safety policy. Please reevaluate patient's clinical condition at discharge and address if the herbal or natural product(s) should be resumed at that time.  Tressie Ellis  09/24/20

## 2020-09-24 NOTE — ED Notes (Signed)
Pt provided orange juice and crackers

## 2020-09-24 NOTE — Progress Notes (Signed)
[facility] admitting physician addendum:  I discussed the case with Dr. Welton Flakes who will evaluate the patient due to his symptomatic bradycardia while in the hospital.  Sanda Klein, MD

## 2020-09-24 NOTE — ED Provider Notes (Signed)
[facility] Emergency Department Provider Note  Time seen: 12:42 PM  I have reviewed the triage vital signs and the nursing notes.   HISTORY  Chief Complaint Weakness   HPI Alexander Kline. is a 79 y.o. male with a past medical history of hypertension, hyperlipidemia, previous CVA, presents to the emergency department for generalized fatigue and weakness.  According to the daughter for the past week or so the patient has been very weak now to the point where he is having trouble walking due to generalized fatigue/weakness.  Daughter states approximately 1 week ago he had a fall as well not sure if he hit his head but was worked up in the emergency department following that with a negative work-up.  Currently patient is weak, unable to tell me the year or why he is here.  Daughter states the patient has been much more confused recently as well.  Patient is febrile to 100.4.   Past Medical History:  Diagnosis Date  . Hyperlipidemia   . Hypertension   . Pre-diabetes   . Stroke [facility])     Patient Active Problem List   Diagnosis Date Noted  . Hyperlipidemia   . Altered mental status   . TIA (transient ischemic attack) 09/17/2020  . Abnormal SPEP 06/23/2020  . Primary hypertension 06/23/2020  . Sinus bradycardia 06/22/2020  . Premature ventricular contractions 06/22/2020  . Heart palpitations 06/22/2020  . Dizziness 06/22/2020  . 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 CVA (cerebral vascular accident) (HCC) 05/30/2018  . Muscle strain of thigh 05/15/2018  . Trochanteric bursitis of right hip 02/14/2018  . Hip fracture, unspecified laterality, closed, initial encounter (HCC) 09/28/2017  . Hip fracture (HCC) 09/28/2017    Past Surgical History:  Procedure Laterality Date  . COLONOSCOPY WITH PROPOFOL N/A 01/14/2020   Procedure: COLONOSCOPY WITH PROPOFOL;  Surgeon: Toledo, Boykin Nearing, MD;  Location: [facility] ENDOSCOPY;  Service:  Gastroenterology;  Laterality: N/A;  . ESOPHAGOGASTRODUODENOSCOPY (EGD) WITH PROPOFOL N/A 01/14/2020   Procedure: ESOPHAGOGASTRODUODENOSCOPY (EGD) WITH PROPOFOL;  Surgeon: Toledo, Boykin Nearing, MD;  Location: [facility] ENDOSCOPY;  Service: Gastroenterology;  Laterality: N/A;  . HIP ARTHROPLASTY Right 09/29/2017   Procedure: ARTHROPLASTY BIPOLAR HIP (HEMIARTHROPLASTY);  Surgeon: Deeann Saint, MD;  Location: [facility] ORS;  Service: Orthopedics;  Laterality: Right;  . JOINT REPLACEMENT    . TEE WITHOUT CARDIOVERSION N/A 07/26/2018   Procedure: TRANSESOPHAGEAL ECHOCARDIOGRAM (TEE);  Surgeon: Laurier Nancy, MD;  Location: [facility] ORS;  Service: Cardiovascular;  Laterality: N/A;    Prior to Admission medications   Medication Sig Start Date End Date Taking? Authorizing Provider  apixaban (ELIQUIS) 5 MG TABS tablet Take 5 mg by mouth 2 (two) times daily.     [provider]  Apoaequorin (PREVAGEN PO) Take 1 tablet by mouth daily.     [provider]  atorvastatin (LIPITOR) 80 MG tablet Take 1 tablet (80 mg total) by mouth daily. 06/04/18   Shaune Pollack, MD  B Complex Vitamins (VITAMIN B COMPLEX PO) Take 1 tablet by mouth daily.     [provider]  Cholecalciferol 125 MCG (5000 UT) TABS Take 5,000 Units by mouth daily.     [provider]  Coconut Oil 1000 MG CAPS Take 1,000 mg by mouth every evening.     [provider]  Coenzyme Q10 (COQ-10) 100 MG CAPS Take 1 capsule by mouth every evening.     [provider]  D-MANNOSE PO Take 600 mg by  mouth in the morning and at bedtime.    [provider]  enalapril (VASOTEC) 2.5 MG tablet Take 2.5 mg by mouth daily.  04/01/18   [provider]  Ferrous Sulfate (IRON PO) Take 1 tablet by mouth daily.    [provider]  fluticasone (FLONASE) 50 MCG/ACT nasal spray Place 1-2 sprays into both nostrils daily as needed (allergies.).  08/18/19   [provider]  metFORMIN (GLUCOPHAGE-XR) 500 MG  24 hr tablet Take 500 mg by mouth daily. 04/08/18   [provider]  Multiple Vitamin (MULTIVITAMIN WITH MINERALS) TABS tablet Take 1 tablet by mouth every evening. Senior Multivitamin    [provider]  Multiple Vitamins-Minerals (ANTIOXIDANT FORMULA) TABS Take 1 tablet by mouth every evening. Super Antioxidant    [provider]  Omega 3-6-9 Fatty Acids (OMEGA-3-6-9 PO) Take 1 capsule by mouth every evening.    [provider]  omeprazole (PRILOSEC) 20 MG capsule Take 20 mg by mouth every evening.     [provider]  OVER THE COUNTER MEDICATION Take 1 capsule by mouth daily. Lion's mane Supplement    [provider]  Sodium Fluoride (CLINPRO 5000) 1.1 % PSTE Place 1 application onto teeth in the morning and at bedtime.     [provider]    Allergies  Allergen Reactions  . Sulfa Antibiotics     Per pt, he gets bad headaches with sulfa drugs    Family History  Problem Relation Age of Onset  . CAD Father   . CAD Brother     Social History Social History   Tobacco Use  . Smoking status: Never Smoker  . Smokeless tobacco: Never Used  Vaping Use  . Vaping Use: Never used  Substance Use Topics  . Alcohol use: No    Alcohol/week: 0.0 standard drinks  . Drug use: No    Review of Systems Constitutional: Fever to 100.4, unknown to patient her daughter Cardiovascular: Negative for chest pain. Respiratory: Negative for shortness of breath.  Negative for cough Gastrointestinal: Negative for abdominal pain, vomiting and diarrhea. Genitourinary: Negative for urinary compaints Musculoskeletal: Negative for musculoskeletal complaints Neurological: Negative for headache All other ROS negative, although possibly limited due to altered mental status/confusion.  ____________________________________________   PHYSICAL EXAM:  VITAL SIGNS: ED Triage Vitals  Enc Vitals Group     BP 09/24/20 1132 (!) 142/74     Pulse Rate  09/24/20 1129 60     Resp 09/24/20 1129 19     Temp 09/24/20 1131 (!) 100.4 F (38 C)     Temp Source 09/24/20 1131 Oral     SpO2 09/24/20 1129 96 %     Weight 09/24/20 1130 240 lb 4.8 oz (109 kg)     Height 09/24/20 1130 5\' 11"  (1.803 m)     Head Circumference --      Peak Flow --      Pain Score 09/24/20 1130 0     Pain Loc --      Pain Edu? --      Excl. in GC? --    Constitutional: Alert and oriented. Well appearing and in no distress. Eyes: Normal exam ENT      Head: Normocephalic and atraumatic.      Mouth/Throat: Mucous membranes are moist. Cardiovascular: Normal rate, regular rhythm.  Respiratory: Normal respiratory effort without tachypnea nor retractions. Breath sounds are clear  Gastrointestinal: Soft and nontender. No distention.   Musculoskeletal: Nontender with normal range  of motion in all extremities. Neurologic:  Normal speech and language. No gross focal neurologic deficits Skin:  Skin is warm, dry and intact.  Psychiatric: Mood and affect are normal.   ____________________________________________    EKG  EKG viewed and interpreted by myself shows a normal sinus rhythm at 71 bpm with a narrow QRS, normal axis, normal intervals, nonspecific ST changes.  ____________________________________________    RADIOLOGY  CT scan of the head shows no acute abnormality. Chest x-ray is negative.  ____________________________________________   INITIAL IMPRESSION / ASSESSMENT AND PLAN / ED COURSE  Pertinent labs & imaging results that were available during my care of the patient were reviewed by me and considered in my medical decision making (see chart for details).   Patient presents to the emergency department for generalized fatigue and weakness with difficulty walking today due to weakness.  Patient noted to be febrile to 100.4.  Labs also show elevated lactate of 2.7.  Remainder the lab work is largely nonrevealing.  Urinalysis pending.  We are covering with  broad-spectrum antibiotics given the elevated lactate and temperature 100.4.  We will dose IV fluids and continue to closely monitor while awaiting results.  Urine has resulted large within normal limits as well, Covid test pending.  Lab work is essentially negative, rare bacteria in the urine, urine culture pending.  However given the patient's generalized weakness initial lactate of 2.7 and fever.  Patient will be covered with antibiotics and admitted to the hospital service for further treatment work-up.  Alexander Meadow. was evaluated in Emergency Department on 09/24/2020 for the symptoms described in the history of present illness. He was evaluated in the context of the global COVID-19 pandemic, which necessitated consideration that the patient might be at risk for infection with the SARS-CoV-2 virus that causes COVID-19. Institutional protocols and algorithms that pertain to the evaluation of patients at risk for COVID-19 are in a state of rapid change based on information released by regulatory bodies including the CDC and federal and state organizations. These policies and algorithms were followed during the patient's care in the ED.  ____________________________________________   FINAL CLINICAL IMPRESSION(S) / ED DIAGNOSES  Fever Weakness   Minna Antis, MD 09/24/20 1356

## 2020-09-24 NOTE — H&P (Signed)
History and Physical    Alexander Kline. BHA:[id number] DOB: 13-Feb-1941 DOA: 09/24/2020  PCP: Sherron Monday, MD   Patient coming from: Home.  I have personally briefly reviewed patient's old medical records in [facility]  Chief Complaint: Weakness.  HPI: Alexander Kline. is a 79 y.o. male with medical history significant of hyperlipidemia, hypertension, prediabetes, history of other nonhemorrhagic CVA, grade 1 diastolic dysfunction who was admitted overnight last week due to what was thought to be a TIA after imaging was negative for CVA versus symptomatic bradycardia causing brain hypoperfusion who today is coming to the emergency department due to weakness for the past 2 days associated with decreased mentation.  His daughter also states that he had a recent fall, but there was no LOC.  He also posterior headache with some neck pain 2 to 3 days ago.  He had his Covid vaccine booster on 09/16/2020 and she believes that these may have produced some of the symptomatology that the patient has experienced.  He does not add much to the HPI, but if he is able to answer simple questions.  He denies headache, dyspnea, chest, back or abdominal pain at this time.  No diarrhea, constipation, melena or hematochezia.  No dysuria, frequency or hematuria.  He gets occasional lower extremity edema, but nothing significant recently.  His appetite seems to be somewhat decreased, but no significant issues sleeping.  ED Course: Initial vital signs were temperature 100.4 F, pulse 60, respiration 19, BP 142/74 mmHg O2 sat 96% on room air.  The patient was given a 1000 mL NS bolus, cefepime and vancomycin.  Labs: Urinalysis was amber in color and cloudy in appearance with rare bacteria on microscopic examination.  The rest of the urinalysis values were unremarkable.  CBC shows a white count of 8.6 with 80% neutrophils, 11% lymphocytes and 5% monocytes.  Hemoglobin 13.1 g/dL and platelets 973.  PT 15.2, INR  1.3 and PTT 35.  Lactic acid was 2.7 then 1.4 mmol/L.  CMP shows a potassium of 3.4 mmol/L, the rest of the electrolytes are within expected range.  Creatinine was 1.40 and glucose 207 mg/dL.  The rest of the CMP values are within expected range.  Imaging: One-view portable chest radiograph did not show any active cardiopulmonary disease.  CT head shows cerebral atrophy and chronic small vessel ischemic disease, but no acute intracranial normality.  Please see images and full radiology report for further detail.  Review of Systems: As per HPI otherwise all other systems reviewed and are negative.  Past Medical History:  Diagnosis Date  . Hyperlipidemia   . Hypertension   . Pre-diabetes   . Stroke [facility])    Past Surgical History:  Procedure Laterality Date  . COLONOSCOPY WITH PROPOFOL N/A 01/14/2020   Procedure: COLONOSCOPY WITH PROPOFOL;  Surgeon: Toledo, Boykin Nearing, MD;  Location: [facility] ENDOSCOPY;  Service: Gastroenterology;  Laterality: N/A;  . ESOPHAGOGASTRODUODENOSCOPY (EGD) WITH PROPOFOL N/A 01/14/2020   Procedure: ESOPHAGOGASTRODUODENOSCOPY (EGD) WITH PROPOFOL;  Surgeon: Toledo, Boykin Nearing, MD;  Location: [facility] ENDOSCOPY;  Service: Gastroenterology;  Laterality: N/A;  . HIP ARTHROPLASTY Right 09/29/2017   Procedure: ARTHROPLASTY BIPOLAR HIP (HEMIARTHROPLASTY);  Surgeon: Deeann Saint, MD;  Location: [facility] ORS;  Service: Orthopedics;  Laterality: Right;  . JOINT REPLACEMENT    . TEE WITHOUT CARDIOVERSION N/A 07/26/2018   Procedure: TRANSESOPHAGEAL ECHOCARDIOGRAM (TEE);  Surgeon: Laurier Nancy, MD;  Location: [facility] ORS;  Service: Cardiovascular;  Laterality: N/A;   Social History  reports  that he has never smoked. He has never used smokeless tobacco. He reports that he does not drink alcohol and does not use drugs.  Allergies  Allergen Reactions  . Sulfa Antibiotics     Per pt, he gets bad headaches with sulfa drugs   Family History  Problem Relation Age of Onset  . CAD Father   . CAD  Brother    Prior to Admission medications   Medication Sig Start Date End Date Taking? Authorizing Provider  apixaban (ELIQUIS) 5 MG TABS tablet Take 5 mg by mouth 2 (two) times daily.     [provider]  Apoaequorin (PREVAGEN PO) Take 1 tablet by mouth daily.     [provider]  atorvastatin (LIPITOR) 80 MG tablet Take 1 tablet (80 mg total) by mouth daily. 06/04/18   Shaune Pollack, MD  B Complex Vitamins (VITAMIN B COMPLEX PO) Take 1 tablet by mouth daily.     [provider]  Cholecalciferol 125 MCG (5000 UT) TABS Take 5,000 Units by mouth daily.     [provider]  Coconut Oil 1000 MG CAPS Take 1,000 mg by mouth every evening.     [provider]  Coenzyme Q10 (COQ-10) 100 MG CAPS Take 1 capsule by mouth every evening.     [provider]  D-MANNOSE PO Take 600 mg by mouth in the morning and at bedtime.    [provider]  enalapril (VASOTEC) 2.5 MG tablet Take 2.5 mg by mouth daily.  04/01/18   [provider]  Ferrous Sulfate (IRON PO) Take 1 tablet by mouth daily.    [provider]  fluticasone (FLONASE) 50 MCG/ACT nasal spray Place 1-2 sprays into both nostrils daily as needed (allergies.).  08/18/19   [provider]  metFORMIN (GLUCOPHAGE-XR) 500 MG 24 hr tablet Take 500 mg by mouth daily. 04/08/18   [provider]  Multiple Vitamin (MULTIVITAMIN WITH MINERALS) TABS tablet Take 1 tablet by mouth every evening. Senior Multivitamin    [provider]  Multiple Vitamins-Minerals (ANTIOXIDANT FORMULA) TABS Take 1 tablet by mouth every evening. Super Antioxidant    [provider]  Omega 3-6-9 Fatty Acids (OMEGA-3-6-9 PO) Take 1 capsule by mouth every evening.    [provider]  omeprazole (PRILOSEC) 20 MG capsule Take 20 mg by mouth every evening.     [provider]  OVER THE COUNTER MEDICATION Take 1 capsule by mouth daily. Lion's mane Supplement     [provider]  Sodium Fluoride (CLINPRO 5000) 1.1 % PSTE Place 1 application onto teeth in the morning and at bedtime.     [provider]   Physical Exam: Vitals:   09/24/20 1131 09/24/20 1132 09/24/20 1246 09/24/20 1300  BP:  (!) 142/74  139/75  Pulse:    (!) 55  Resp:    15  Temp: (!) 100.4 F (38 C)  99 F (37.2 C)   TempSrc: Oral  Oral   SpO2:    97%  Weight:      Height:       Constitutional: NAD, calm, comfortable Eyes: PERRL, lids and conjunctivae normal ENMT: Mucous membranes are moist. Posterior pharynx clear of any exudate or lesions. Neck: normal, supple, no masses, no thyromegaly Respiratory: Decreased breath sounds in bases, otherwise clear to auscultation bilaterally, no wheezing, no crackles. Normal respiratory effort. No accessory muscle use.  Cardiovascular: Bradycardic creatinine in the mid 30s to 50s with an irregularly irregular rhythm, no  murmurs / rubs / gallops. No extremity edema. 2+ pedal pulses. No carotid bruits.  Abdomen: Obese, nondistended. Bowel sounds positive.  Soft, no tenderness, no masses palpated. No hepatosplenomegaly. Musculoskeletal: Generalized nonfocal weakness.  No clubbing / cyanosis.  Good ROM, no contractures. Normal muscle tone.  Skin: Some areas of ecchymosis on extremities. Neurologic: CN 2-12 grossly intact. Sensation intact, DTR normal. Strength 4 /5 in all 4.  Psychiatric: Alert and oriented x 2, partially oriented to time/date and situation. Normal mood.   Labs on Admission: I have personally reviewed following labs and imaging studies  CBC: Recent Labs  Lab 09/17/20 1734 09/17/20 2015 09/18/20 0429 09/24/20 1134  WBC 8.2  --  8.1 8.6  NEUTROABS  --  4.4  --  7.0  HGB 13.2  --  11.1* 13.1  HCT 38.8*  --  32.3* 38.6*  MCV 91.1  --  90.5 90.6  PLT 209  --  167 206    Basic Metabolic Panel: Recent Labs  Lab 09/17/20 1734 09/24/20 1134  NA 142 136  K 3.2* 3.4*  CL 102 102  CO2 31 24  GLUCOSE  159* 207*  BUN 18 15  CREATININE 1.33* 1.40*  CALCIUM 9.5 9.0   GFR: Estimated Creatinine Clearance: 52.9 mL/min (A) (by C-G formula based on SCr of 1.4 mg/dL (H)).  Liver Function Tests: Recent Labs  Lab 09/17/20 2015 09/24/20 1134  AST 28 31  ALT 22 24  ALKPHOS 116 118  BILITOT 0.7 0.9  PROT 6.6 6.7  ALBUMIN 3.4* 3.4*   Urine analysis:    Component Value Date/Time   COLORURINE AMBER (A) 09/24/2020 1134   APPEARANCEUR CLOUDY (A) 09/24/2020 1134   LABSPEC 1.015 09/24/2020 1134   PHURINE 5.0 09/24/2020 1134   GLUCOSEU NEGATIVE 09/24/2020 1134   HGBUR NEGATIVE 09/24/2020 1134   BILIRUBINUR NEGATIVE 09/24/2020 1134   KETONESUR NEGATIVE 09/24/2020 1134   PROTEINUR NEGATIVE 09/24/2020 1134   NITRITE NEGATIVE 09/24/2020 1134   LEUKOCYTESUR NEGATIVE 09/24/2020 1134   Radiological Exams on Admission: CT Head Wo Contrast  Result Date: 09/24/2020 CLINICAL DATA:  Head trauma, minor. Additional provided: Patient reports weakness for 2 days, altered mental status per family. Recent fall, taking blood thinners, history of prior stroke EXAM: CT HEAD WITHOUT CONTRAST TECHNIQUE: Contiguous axial images were obtained from the base of the skull through the vertex without intravenous contrast. COMPARISON:  Brain MRI 09/17/2020.  Head CT 09/17/2020. FINDINGS: Brain: Mild-to-moderate generalized cerebral atrophy. Mild ill-defined hypoattenuation within the cerebral white matter is nonspecific, but compatible with chronic small vessel ischemic disease. There is no acute intracranial hemorrhage. No demarcated cortical infarct. No extra-axial fluid collection. No evidence of intracranial mass. No midline shift. Vascular: No hyperdense vessel. Skull: Normal. Negative for fracture or focal lesion. Sinuses/Orbits: Visualized orbits show no acute finding. Mild ethmoid sinus mucosal thickening. IMPRESSION: No evidence of acute intracranial abnormality. Cerebral atrophy and chronic small vessel ischemic  disease, stable. Mild ethmoid sinus mucosal thickening. Electronically Signed   By: Jackey Loge DO   On: 09/24/2020 12:13   DG Chest Port 1 View  Result Date: 09/24/2020 CLINICAL DATA:  Questionable sepsis. Weakness, altered mental status. EXAM: PORTABLE CHEST 1 VIEW COMPARISON:  09/17/2020 FINDINGS: Postoperative changes noted in the left scapula and ribs. Heart is borderline in size. Probable lingular scarring. No acute confluent airspace opacities or effusions. No acute bony abnormality. IMPRESSION: No active disease. Electronically Signed   By: Charlett Nose M.D.   On: 09/24/2020 12:03  09/18/2020 echocardiogram IMPRESSIONS   1. Left ventricular ejection fraction, by estimation, is 55 to 60%. The  left ventricle has normal function. The left ventricle has no regional  wall motion abnormalities. There is moderate left ventricular hypertrophy.  Left ventricular diastolic  parameters are consistent with Grade I diastolic dysfunction (impaired  relaxation).  2. Right ventricular systolic function is mildly reduced. The right  ventricular size is moderately enlarged.  3. Left atrial size was moderately dilated.  4. Right atrial size was moderately dilated.  5. The pericardial effusion is circumferential.  6. The mitral valve is normal in structure. Mild mitral valve  regurgitation. No evidence of mitral stenosis.  7. The aortic valve is normal in structure. Aortic valve regurgitation is  not visualized. Mild aortic valve sclerosis is present, with no evidence  of aortic valve stenosis.  8. The inferior vena cava is normal in size with greater than 50%  respiratory variability, suggesting right atrial pressure of 3 mmHg.   EKG: Independently reviewed. Vent. rate 71 BPM PR interval * ms QRS duration 94 ms QT/QTc 420/457 ms P-R-T axes 19 190 -6 Sinus rhythm Supraventricular bigeminy Right axis deviation Consider anterior infarct Borderline T abnormalities, inferior  leads  Assessment/Plan Principal Problem:   Fever Did not admit SIRS/sepsis criteria. However looks acutely ill. Observation/MedSurg/telemetry. Gentle/time-limited IV hydration. Continue cefepime/vancomycin per pharmacy. Metronidazole  Active Problems:   Bradycardia, unspecified A. fib with SVR/sinus arrhythmia. CHA?DS?-VASc Score of 8. Cardiology suggested pacemaker. Health insurance declined pacemaker. Continue apixaban. Will consult cardiology.    Grade I diastolic dysfunction No signs of decompensation. Sodium and fluid restriction. Continue enalapril 2.5 mg p.o. daily.    Primary hypertension Continue enalapril 2.5 mg p.o. daily. Monitor BP, renal function electrolytes.    Hyperlipidemia Continue atorvastatin 80 mg p.o. daily.    Pre-diabetes Continue Metformin XR 500 mg p.o. daily. CBG monitoring with regular insulin sliding scale.    Hypokalemia Replacing. Check magnesium and phosphorus level. Follow-up potassium level.    Mild protein malnutrition (HCC) Consult nutritional services. Protein supplementation.    DVT prophylaxis: On Eliquis. Code Status:   Full code. Family Communication:  Discussed with his daughter Alexander Kline. Disposition Plan:   Patient is from:  Home.  Anticipated DC to:  TBD.  Anticipated DC date:  09/25/2020 or 09/26/2020.  Anticipated DC barriers: Clinical status, discharge planning.  Consults called:  Cardiology (paged/awaiting response from Dr. Welton FlakesKhan) Admission status:  Observation/telemetry.   Severity of Illness: High secondary to fever causing weakness, AMS and lactic acidosis.  The patient is not ready to be discharged home.  Bobette Moavid Manuel Barrie Wale MD [facility]  How to contact the [facility] Attending or Consulting provider 7A - 7P or covering provider during after hours 7P -7A, for this patient?   1. Check the care team in [facility] and look for a) attending/consulting [facility] provider listed and b) the [facility] team listed 2. Log into  [email or web address] and use [place]'s universal password to access. If you do not have the password, please contact the hospital operator. 3. Locate the [facility] provider you are looking for under [facility] and page to a number that you can be directly reached. 4. If you still have difficulty reaching the provider, please page the [facility] (Director on Call) for the Hospitalists listed on amion for assistance.  09/24/2020, 2:06 PM   This document was prepared using Dragon voice recognition software and may contain some unintended transcription errors.

## 2020-09-24 NOTE — ED Notes (Signed)
Dr Lenard Lance notified of lactic 2.7

## 2020-09-25 DIAGNOSIS — Z882 Allergy status to sulfonamides status: Secondary | ICD-10-CM | POA: Diagnosis not present

## 2020-09-25 DIAGNOSIS — R001 Bradycardia, unspecified: Secondary | ICD-10-CM | POA: Diagnosis present

## 2020-09-25 DIAGNOSIS — I10 Essential (primary) hypertension: Secondary | ICD-10-CM

## 2020-09-25 DIAGNOSIS — Z8249 Family history of ischemic heart disease and other diseases of the circulatory system: Secondary | ICD-10-CM | POA: Diagnosis not present

## 2020-09-25 DIAGNOSIS — E876 Hypokalemia: Secondary | ICD-10-CM | POA: Diagnosis present

## 2020-09-25 DIAGNOSIS — N1831 Chronic kidney disease, stage 3a: Secondary | ICD-10-CM | POA: Diagnosis present

## 2020-09-25 DIAGNOSIS — I441 Atrioventricular block, second degree: Secondary | ICD-10-CM | POA: Diagnosis present

## 2020-09-25 DIAGNOSIS — I5032 Chronic diastolic (congestive) heart failure: Secondary | ICD-10-CM | POA: Diagnosis present

## 2020-09-25 DIAGNOSIS — E441 Mild protein-calorie malnutrition: Secondary | ICD-10-CM | POA: Diagnosis present

## 2020-09-25 DIAGNOSIS — N179 Acute kidney failure, unspecified: Secondary | ICD-10-CM | POA: Diagnosis present

## 2020-09-25 DIAGNOSIS — Z6833 Body mass index (BMI) 33.0-33.9, adult: Secondary | ICD-10-CM | POA: Diagnosis not present

## 2020-09-25 DIAGNOSIS — Z955 Presence of coronary angioplasty implant and graft: Secondary | ICD-10-CM | POA: Diagnosis not present

## 2020-09-25 DIAGNOSIS — I130 Hypertensive heart and chronic kidney disease with heart failure and stage 1 through stage 4 chronic kidney disease, or unspecified chronic kidney disease: Secondary | ICD-10-CM | POA: Diagnosis present

## 2020-09-25 DIAGNOSIS — I5189 Other ill-defined heart diseases: Secondary | ICD-10-CM | POA: Diagnosis not present

## 2020-09-25 DIAGNOSIS — Z7984 Long term (current) use of oral hypoglycemic drugs: Secondary | ICD-10-CM | POA: Diagnosis not present

## 2020-09-25 DIAGNOSIS — Z8673 Personal history of transient ischemic attack (TIA), and cerebral infarction without residual deficits: Secondary | ICD-10-CM | POA: Diagnosis not present

## 2020-09-25 DIAGNOSIS — Z20822 Contact with and (suspected) exposure to covid-19: Secondary | ICD-10-CM | POA: Diagnosis present

## 2020-09-25 DIAGNOSIS — Z79899 Other long term (current) drug therapy: Secondary | ICD-10-CM | POA: Diagnosis not present

## 2020-09-25 DIAGNOSIS — R54 Age-related physical debility: Secondary | ICD-10-CM | POA: Diagnosis present

## 2020-09-25 DIAGNOSIS — R509 Fever, unspecified: Secondary | ICD-10-CM | POA: Diagnosis present

## 2020-09-25 DIAGNOSIS — R7303 Prediabetes: Secondary | ICD-10-CM | POA: Diagnosis not present

## 2020-09-25 DIAGNOSIS — Z7901 Long term (current) use of anticoagulants: Secondary | ICD-10-CM | POA: Diagnosis not present

## 2020-09-25 DIAGNOSIS — I480 Paroxysmal atrial fibrillation: Secondary | ICD-10-CM | POA: Diagnosis present

## 2020-09-25 DIAGNOSIS — E785 Hyperlipidemia, unspecified: Secondary | ICD-10-CM | POA: Diagnosis present

## 2020-09-25 DIAGNOSIS — E1122 Type 2 diabetes mellitus with diabetic chronic kidney disease: Secondary | ICD-10-CM | POA: Diagnosis present

## 2020-09-25 LAB — COMPREHENSIVE METABOLIC PANEL
ALT: 18 U/L (ref 0–44)
AST: 22 U/L (ref 15–41)
Albumin: 2.8 g/dL — ABNORMAL LOW (ref 3.5–5.0)
Alkaline Phosphatase: 92 U/L (ref 38–126)
Anion gap: 9 (ref 5–15)
BUN: 14 mg/dL (ref 8–23)
CO2: 22 mmol/L (ref 22–32)
Calcium: 8.1 mg/dL — ABNORMAL LOW (ref 8.9–10.3)
Chloride: 108 mmol/L (ref 98–111)
Creatinine, Ser: 1.12 mg/dL (ref 0.61–1.24)
GFR, Estimated: >60 mL/min (ref >60)
Glucose, Bld: 103 mg/dL — ABNORMAL HIGH (ref 70–99)
Potassium: 3.3 mmol/L — ABNORMAL LOW (ref 3.5–5.1)
Sodium: 139 mmol/L (ref 135–145)
Total Bilirubin: 0.8 mg/dL (ref 0.3–1.2)
Total Protein: 5.5 g/dL — ABNORMAL LOW (ref 6.5–8.1)

## 2020-09-25 LAB — CBC WITH DIFFERENTIAL/PLATELET
Abs Immature Granulocytes: 0.02 10*3/uL (ref 0.00–0.07)
Basophils Absolute: 0 10*3/uL (ref 0.0–0.1)
Basophils Relative: 1 %
Eosinophils Absolute: 0.2 10*3/uL (ref 0.0–0.5)
Eosinophils Relative: 3 %
HCT: 31.9 % — ABNORMAL LOW (ref 39.0–52.0)
Hemoglobin: 11.1 g/dL — ABNORMAL LOW (ref 13.0–17.0)
Immature Granulocytes: 0 %
Lymphocytes Relative: 21 %
Lymphs Abs: 1.7 10*3/uL (ref 0.7–4.0)
MCH: 31.1 pg (ref 26.0–34.0)
MCHC: 34.8 g/dL (ref 30.0–36.0)
MCV: 89.4 fL (ref 80.0–100.0)
Monocytes Absolute: 0.6 10*3/uL (ref 0.1–1.0)
Monocytes Relative: 8 %
Neutro Abs: 5.4 10*3/uL (ref 1.7–7.7)
Neutrophils Relative %: 67 %
Platelets: 157 10*3/uL (ref 150–400)
RBC: 3.57 MIL/uL — ABNORMAL LOW (ref 4.22–5.81)
RDW: 13.8 % (ref 11.5–15.5)
WBC: 7.9 10*3/uL (ref 4.0–10.5)
nRBC: 0 % (ref 0.0–0.2)

## 2020-09-25 LAB — GLUCOSE, CAPILLARY
Glucose-Capillary: 102 mg/dL — ABNORMAL HIGH (ref 70–99)
Glucose-Capillary: 104 mg/dL — ABNORMAL HIGH (ref 70–99)
Glucose-Capillary: 76 mg/dL (ref 70–99)
Glucose-Capillary: 108 mg/dL — ABNORMAL HIGH (ref 70–99)
Glucose-Capillary: 99 mg/dL (ref 70–99)

## 2020-09-25 LAB — PROCALCITONIN: Procalcitonin: <0.1 ng/mL

## 2020-09-25 MED ORDER — VANCOMYCIN HCL 1500 MG/300ML IV SOLN
1500.0000 mg | INTRAVENOUS | Status: DC
  Filled 2020-09-25: qty 300

## 2020-09-25 MED ORDER — HYDRALAZINE HCL 20 MG/ML IJ SOLN
20.0000 mg | Freq: Four times a day (QID) | INTRAMUSCULAR | Status: DC | PRN
  Administered 2020-09-25: 20 mg via INTRAVENOUS
  Filled 2020-09-25: qty 1

## 2020-09-25 MED ORDER — SODIUM CHLORIDE 0.9 % IV SOLN
2.0000 g | Freq: Three times a day (TID) | INTRAVENOUS | Status: DC
  Filled 2020-09-25 (×2): qty 2

## 2020-09-25 MED ORDER — POTASSIUM CHLORIDE CRYS ER 20 MEQ PO TBCR
40.0000 meq | EXTENDED_RELEASE_TABLET | Freq: Once | ORAL | Status: AC
  Administered 2020-09-25: 40 meq via ORAL
  Filled 2020-09-25: qty 2

## 2020-09-25 NOTE — Consult Note (Signed)
Alexander Kline. is a 79 y.o. male  [id number]  Primary Cardiologist: Adrian Blackwater Reason for Consultation: Bradycardia with presyncope  HPI: This 79 year old white male with a history of symptomatic bradycardia with paroxysmal atrial fibrillation presented to the hospital with presyncope and needs permanent pacemaker.  Patient was evaluated by [facility] cardiology, and it was decided that patient needs a pacemaker but insurance did not approve it.  Patient is now very symptomatic and is having presyncope associated with bradycardia in the rate of low 40s.  Is also associated with paroxysmal atrial fibrillation secondary to severe bradycardia.   Review of Systems: No chest pain   Past Medical History:  Diagnosis Date  . Grade I diastolic dysfunction   . Hyperlipidemia   . Hypertension   . Pre-diabetes   . Stroke [facility])     Medications Prior to Admission  Medication Sig Dispense Refill  . apixaban (ELIQUIS) 5 MG TABS tablet Take 5 mg by mouth 2 (two) times daily.     Marland Kitchen Apoaequorin (PREVAGEN PO) Take 1 tablet by mouth daily.     Marland Kitchen atorvastatin (LIPITOR) 80 MG tablet Take 1 tablet (80 mg total) by mouth daily. 30 tablet 1  . B Complex Vitamins (VITAMIN B COMPLEX PO) Take 1 tablet by mouth daily.     . Cholecalciferol 125 MCG (5000 UT) TABS Take 5,000 Units by mouth daily.     . Coconut Oil 1000 MG CAPS Take 1,000 mg by mouth every evening.     . Coenzyme Q10 (COQ-10) 100 MG CAPS Take 1 capsule by mouth every evening.     . D-MANNOSE PO Take 600 mg by mouth in the morning and at bedtime.    . enalapril (VASOTEC) 2.5 MG tablet Take 2.5 mg by mouth daily.     . Ferrous Sulfate (IRON PO) Take 1 tablet by mouth daily.    . fluticasone (FLONASE) 50 MCG/ACT nasal spray Place 1-2 sprays into both nostrils daily as needed (allergies.).     Marland Kitchen metFORMIN (GLUCOPHAGE-XR) 500 MG 24 hr tablet Take 500 mg by mouth daily.    . Multiple Vitamin (MULTIVITAMIN WITH MINERALS) TABS tablet Take 1  tablet by mouth every evening. Senior Multivitamin    . Multiple Vitamins-Minerals (ANTIOXIDANT FORMULA) TABS Take 1 tablet by mouth every evening. Super Antioxidant    . Omega 3-6-9 Fatty Acids (OMEGA-3-6-9 PO) Take 1 capsule by mouth every evening.    Marland Kitchen omeprazole (PRILOSEC) 20 MG capsule Take 20 mg by mouth every evening.     Marland Kitchen OVER THE COUNTER MEDICATION Take 1 capsule by mouth daily. Lion's mane Supplement    . Sodium Fluoride (CLINPRO 5000) 1.1 % PSTE Place 1 application onto teeth in the morning and at bedtime.        Marland Kitchen apixaban  5 mg Oral BID  . atorvastatin  80 mg Oral Daily  . cholecalciferol  5,000 Units Oral Daily  . enalapril  2.5 mg Oral Daily  . insulin aspart  0-15 Units Subcutaneous TID WC  . metroNIDAZOLE  500 mg Oral Q8H  . pantoprazole  40 mg Oral Daily    Infusions:   Allergies  Allergen Reactions  . Sulfa Antibiotics     Per pt, he gets bad headaches with sulfa drugs    Social History   Socioeconomic History  . Marital status: Married    Spouse name: Not on file  . Number of children: Not on file  . Years of  education: Not on file  . Highest education level: Not on file  Occupational History  . Not on file  Tobacco Use  . Smoking status: Never Smoker  . Smokeless tobacco: Never Used  Vaping Use  . Vaping Use: Never used  Substance and Sexual Activity  . Alcohol use: No    Alcohol/week: 0.0 standard drinks  . Drug use: No  . Sexual activity: Not Currently  Other Topics Concern  . Not on file  Social History Narrative  . Not on file   Social Determinants of Health   Financial Resource Strain:   . Difficulty of Paying Living Expenses: Not on file  Food Insecurity:   . Worried About Programme researcher, broadcasting/film/video in the Last Year: Not on file  . Ran Out of Food in the Last Year: Not on file  Transportation Needs:   . Lack of Transportation (Medical): Not on file  . Lack of Transportation (Non-Medical): Not on file  Physical Activity:   . Days of  Exercise per Week: Not on file  . Minutes of Exercise per Session: Not on file  Stress:   . Feeling of Stress : Not on file  Social Connections:   . Frequency of Communication with Friends and Family: Not on file  . Frequency of Social Gatherings with Friends and Family: Not on file  . Attends Religious Services: Not on file  . Active Member of Clubs or Organizations: Not on file  . Attends Banker Meetings: Not on file  . Marital Status: Not on file  Intimate Partner Violence:   . Fear of Current or Ex-Partner: Not on file  . Emotionally Abused: Not on file  . Physically Abused: Not on file  . Sexually Abused: Not on file    Family History  Problem Relation Age of Onset  . CAD Father   . CAD Brother     PHYSICAL EXAM: Vitals:   09/24/20 2350 09/25/20 0452  BP: 140/70 (!) 136/59  Pulse: (!) 41 (!) 43  Resp: 18 18  Temp: 99.3 F (37.4 C) 99.4 F (37.4 C)  SpO2: 99% 98%     Intake/Output Summary (Last 24 hours) at 09/25/2020 1023 Last data filed at 09/25/2020 1006 Gross per 24 hour  Intake 3290.56 ml  Output 200 ml  Net 3090.56 ml    General:  Well appearing. No respiratory difficulty HEENT: normal Neck: supple. no JVD. Carotids 2+ bilat; no bruits. No lymphadenopathy or thryomegaly appreciated. Cor: PMI nondisplaced. Regular rate & rhythm. No rubs, gallops or murmurs. Lungs: clear Abdomen: soft, nontender, nondistended. No hepatosplenomegaly. No bruits or masses. Good bowel sounds. Extremities: no cyanosis, clubbing, rash, edema Neuro: alert & oriented x 3, cranial nerves grossly intact. moves all 4 extremities w/o difficulty. Affect pleasant.  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  Results for orders placed or performed during the hospital encounter of 09/24/20 (from the past 24 hour(s))  Lactic acid, plasma     Status: Abnormal   Collection Time: 09/24/20 11:34 AM  Result Value Ref Range   Lactic Acid, Venous 2.7 (HH) 0.5 - 1.9  mmol/L  Comprehensive metabolic panel     Status: Abnormal   Collection Time: 09/24/20 11:34 AM  Result Value Ref Range   Sodium 136 135 - 145 mmol/L   Potassium 3.4 (L) 3.5 - 5.1 mmol/L   Chloride 102 98 - 111 mmol/L   CO2 24 22 - 32 mmol/L   Glucose, Bld 207 (H)  70 - 99 mg/dL   BUN 15 8 - 23 mg/dL   Creatinine, Ser 2.67 (H) 0.61 - 1.24 mg/dL   Calcium 9.0 8.9 - 12.4 mg/dL   Total Protein 6.7 6.5 - 8.1 g/dL   Albumin 3.4 (L) 3.5 - 5.0 g/dL   AST 31 15 - 41 U/L   ALT 24 0 - 44 U/L   Alkaline Phosphatase 118 38 - 126 U/L   Total Bilirubin 0.9 0.3 - 1.2 mg/dL   GFR, Estimated 51 (L) >60 mL/min   Anion gap 10 5 - 15  CBC WITH DIFFERENTIAL     Status: Abnormal   Collection Time: 09/24/20 11:34 AM  Result Value Ref Range   WBC 8.6 4.0 - 10.5 K/uL   RBC 4.26 4.22 - 5.81 MIL/uL   Hemoglobin 13.1 13.0 - 17.0 g/dL   HCT 58.0 (L) 39 - 52 %   MCV 90.6 80.0 - 100.0 fL   MCH 30.8 26.0 - 34.0 pg   MCHC 33.9 30.0 - 36.0 g/dL   RDW 99.8 33.8 - 25.0 %   Platelets 206 150 - 400 K/uL   nRBC 0.0 0.0 - 0.2 %   Neutrophils Relative % 80 %   Neutro Abs 7.0 1.7 - 7.7 K/uL   Lymphocytes Relative 11 %   Lymphs Abs 1.0 0.7 - 4.0 K/uL   Monocytes Relative 5 %   Monocytes Absolute 0.4 0.1 - 1.0 K/uL   Eosinophils Relative 3 %   Eosinophils Absolute 0.2 0.0 - 0.5 K/uL   Basophils Relative 1 %   Basophils Absolute 0.0 0.0 - 0.1 K/uL   Immature Granulocytes 0 %   Abs Immature Granulocytes 0.02 0.00 - 0.07 K/uL  Protime-INR     Status: Abnormal   Collection Time: 09/24/20 11:34 AM  Result Value Ref Range   Prothrombin Time 15.2 11.4 - 15.2 seconds   INR 1.3 (H) 0.8 - 1.2  APTT     Status: None   Collection Time: 09/24/20 11:34 AM  Result Value Ref Range   aPTT 35 24 - 36 seconds  Blood Culture (routine x 2)     Status: None (Preliminary result)   Collection Time: 09/24/20 11:34 AM   Specimen: BLOOD  Result Value Ref Range   Specimen Description BLOOD BLOOD LEFT FOREARM    Special Requests       BOTTLES DRAWN AEROBIC AND ANAEROBIC Blood Culture adequate volume   Culture      NO GROWTH < 24 HOURS Performed at [facility], [address] [place], [place] [address]    Report Status PENDING   Blood Culture (routine x 2)     Status: None (Preliminary result)   Collection Time: 09/24/20 11:34 AM   Specimen: BLOOD  Result Value Ref Range   Specimen Description BLOOD LEFT ANTECUBITAL    Special Requests      BOTTLES DRAWN AEROBIC AND ANAEROBIC Blood Culture adequate volume   Culture      NO GROWTH < 24 HOURS Performed at [facility], [address] [place], [place] [address]    Report Status PENDING   Urinalysis, Complete w Microscopic     Status: Abnormal   Collection Time: 09/24/20 11:34 AM  Result Value Ref Range   Color, Urine AMBER (A) YELLOW   APPearance CLOUDY (A) CLEAR   Specific Gravity, Urine 1.015 1.005 - 1.030   pH 5.0 5.0 - 8.0   Glucose, UA NEGATIVE NEGATIVE mg/dL   Hgb urine dipstick  NEGATIVE NEGATIVE   Bilirubin Urine NEGATIVE NEGATIVE   Ketones, ur NEGATIVE NEGATIVE mg/dL   Protein, ur NEGATIVE NEGATIVE mg/dL   Nitrite NEGATIVE NEGATIVE   Leukocytes,Ua NEGATIVE NEGATIVE   RBC / HPF 0-5 0 - 5 RBC/hpf   WBC, UA 0-5 0 - 5 WBC/hpf   Bacteria, UA RARE (A) NONE SEEN   Squamous Epithelial / LPF 0-5 0 - 5   Mucus PRESENT    Hyaline Casts, UA PRESENT   Resp Panel by RT-PCR (Flu A&B, Covid) Nasopharyngeal Swab     Status: None   Collection Time: 09/24/20 11:34 AM   Specimen: Nasopharyngeal Swab; Nasopharyngeal(NP) swabs in vial transport medium  Result Value Ref Range   SARS Coronavirus 2 by RT PCR NEGATIVE NEGATIVE   Influenza A by PCR NEGATIVE NEGATIVE   Influenza B by PCR NEGATIVE NEGATIVE  Procalcitonin - Baseline     Status: None   Collection Time: 09/24/20 11:34 AM  Result Value Ref Range   Procalcitonin <0.10 ng/mL  Magnesium     Status: None   Collection Time: 09/24/20 11:34 AM  Result Value Ref Range   Magnesium 1.8 1.7  - 2.4 mg/dL  Phosphorus     Status: Abnormal   Collection Time: 09/24/20 11:34 AM  Result Value Ref Range   Phosphorus 2.0 (L) 2.5 - 4.6 mg/dL  Lactic acid, plasma     Status: None   Collection Time: 09/24/20 12:59 PM  Result Value Ref Range   Lactic Acid, Venous 1.4 0.5 - 1.9 mmol/L  CBG monitoring, ED     Status: None   Collection Time: 09/24/20  4:41 PM  Result Value Ref Range   Glucose-Capillary 82 70 - 99 mg/dL  CBC with Differential     Status: Abnormal   Collection Time: 09/25/20  4:17 AM  Result Value Ref Range   WBC 7.9 4.0 - 10.5 K/uL   RBC 3.57 (L) 4.22 - 5.81 MIL/uL   Hemoglobin 11.1 (L) 13.0 - 17.0 g/dL   HCT 91.431.9 (L) 39 - 52 %   MCV 89.4 80.0 - 100.0 fL   MCH 31.1 26.0 - 34.0 pg   MCHC 34.8 30.0 - 36.0 g/dL   RDW 78.213.8 95.611.5 - 21.315.5 %   Platelets 157 150 - 400 K/uL   nRBC 0.0 0.0 - 0.2 %   Neutrophils Relative % 67 %   Neutro Abs 5.4 1.7 - 7.7 K/uL   Lymphocytes Relative 21 %   Lymphs Abs 1.7 0.7 - 4.0 K/uL   Monocytes Relative 8 %   Monocytes Absolute 0.6 0.1 - 1.0 K/uL   Eosinophils Relative 3 %   Eosinophils Absolute 0.2 0.0 - 0.5 K/uL   Basophils Relative 1 %   Basophils Absolute 0.0 0.0 - 0.1 K/uL   Immature Granulocytes 0 %   Abs Immature Granulocytes 0.02 0.00 - 0.07 K/uL  Comprehensive metabolic panel     Status: Abnormal   Collection Time: 09/25/20  4:17 AM  Result Value Ref Range   Sodium 139 135 - 145 mmol/L   Potassium 3.3 (L) 3.5 - 5.1 mmol/L   Chloride 108 98 - 111 mmol/L   CO2 22 22 - 32 mmol/L   Glucose, Bld 103 (H) 70 - 99 mg/dL   BUN 14 8 - 23 mg/dL   Creatinine, Ser 0.861.12 0.61 - 1.24 mg/dL   Calcium 8.1 (L) 8.9 - 10.3 mg/dL   Total Protein 5.5 (L) 6.5 - 8.1 g/dL   Albumin 2.8 (L) 3.5 -  5.0 g/dL   AST 22 15 - 41 U/L   ALT 18 0 - 44 U/L   Alkaline Phosphatase 92 38 - 126 U/L   Total Bilirubin 0.8 0.3 - 1.2 mg/dL   GFR, Estimated >23 >55 mL/min   Anion gap 9 5 - 15  Procalcitonin     Status: None   Collection Time: 09/25/20  4:17 AM   Result Value Ref Range   Procalcitonin <0.10 ng/mL  Glucose, capillary     Status: Abnormal   Collection Time: 09/25/20  7:46 AM  Result Value Ref Range   Glucose-Capillary 102 (H) 70 - 99 mg/dL   CT Head Wo Contrast  Result Date: 09/24/2020 CLINICAL DATA:  Head trauma, minor. Additional provided: Patient reports weakness for 2 days, altered mental status per family. Recent fall, taking blood thinners, history of prior stroke EXAM: CT HEAD WITHOUT CONTRAST TECHNIQUE: Contiguous axial images were obtained from the base of the skull through the vertex without intravenous contrast. COMPARISON:  Brain MRI 09/17/2020.  Head CT 09/17/2020. FINDINGS: Brain: Mild-to-moderate generalized cerebral atrophy. Mild ill-defined hypoattenuation within the cerebral white matter is nonspecific, but compatible with chronic small vessel ischemic disease. There is no acute intracranial hemorrhage. No demarcated cortical infarct. No extra-axial fluid collection. No evidence of intracranial mass. No midline shift. Vascular: No hyperdense vessel. Skull: Normal. Negative for fracture or focal lesion. Sinuses/Orbits: Visualized orbits show no acute finding. Mild ethmoid sinus mucosal thickening. IMPRESSION: No evidence of acute intracranial abnormality. Cerebral atrophy and chronic small vessel ischemic disease, stable. Mild ethmoid sinus mucosal thickening. Electronically Signed   By: Jackey Loge DO   On: 09/24/2020 12:13   DG Chest Port 1 View  Result Date: 09/24/2020 CLINICAL DATA:  Questionable sepsis. Weakness, altered mental status. EXAM: PORTABLE CHEST 1 VIEW COMPARISON:  09/17/2020 FINDINGS: Postoperative changes noted in the left scapula and ribs. Heart is borderline in size. Probable lingular scarring. No acute confluent airspace opacities or effusions. No acute bony abnormality. IMPRESSION: No active disease. Electronically Signed   By: Charlett Nose M.D.   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  Spoke to the nurse and [facility], cardiology will be consulted with Dr. Daisy Blossom placing the permanent pacemaker.  Patient was denied pacemaker in the past because was not very symptomatic but currently is exhibiting type II second-degree AV block and is very symptomatic.  Taneika Choi A

## 2020-09-25 NOTE — Progress Notes (Addendum)
Pharmacy Antibiotic Note  Alexander Kline. is a 79 y.o. male admitted on 09/24/2020 with sepsis from unknown source.  Pharmacy was consulted for vancomycin and cefepime dosing. Since admission his renal function has improved. He received a total loading dose of 2500 mg vancomycin 11/26    Plan:  1) adjust cefepime dose to 2 grams IV every 8 hours    2) start vancomycin 1500 mg IV every 24 hours  Ke: 0.051 h-1, T1/2: 13.6 h  Css (calculated): 39.2 / 12.4 mcg/mL  Daily SCr to assess renal function whlie on vancomycin  Levels as clinically indicated  Height: 5\' 11"  (180.3 cm) Weight: 109 kg (240 lb 4.8 oz) IBW/kg (Calculated) : 75.3  Temp (24hrs), Avg:99.6 F (37.6 C), Min:99 F (37.2 C), Max:100.4 F (38 C)  Recent Labs  Lab 09/24/20 1134 09/24/20 1259 09/25/20 0417  WBC 8.6  --  7.9  CREATININE 1.40*  --  1.12  LATICACIDVEN 2.7* 1.4  --     Estimated Creatinine Clearance: 66.1 mL/min (by C-G formula based on SCr of 1.12 mg/dL).    Allergies  Allergen Reactions  . Sulfa Antibiotics     Per pt, he gets bad headaches with sulfa drugs    Antimicrobials this admission: 11/26 vancomycin >> 11/26 cefepime >>  11/26 metronidazole >>  Microbiology results: 11/26 BCx: NG < 24 h 11/26 UCx: Pending  11/26 SARS CoV-2: negative 11/26 influenza A/B: negative  Thank you for allowing pharmacy to be a part of this patient's care.  12/26, PharmD 09/25/2020 8:39 AM

## 2020-09-25 NOTE — Plan of Care (Signed)
Continuing with plan of care. 

## 2020-09-25 NOTE — Progress Notes (Signed)
PT Cancellation Note  Patient Details Name: Alexander Kline. MRN: [id number] DOB: Aug 30, 1941   Cancelled Treatment:    Reason Eval/Treat Not Completed: Patient not medically ready.  PT consult received.  Chart reviewed.  Per cardiology note "Patient is now very symptomatic and is having presyncope associated with bradycardia in the rate of low 40s" and pt requiring permanent pacemaker.  Will hold PT evaluation at this time and re-attempt PT session at a later date/time as medically appropriate.  [name], PT 09/25/20, 1:14 PM

## 2020-09-25 NOTE — Progress Notes (Signed)
PROGRESS NOTE    Alexander Meadowaymond E Mulvey Jr.  ZOX:[id number] DOB: 06/02/1941 DOA: 09/24/2020 PCP: Sherron Mondayejan-Sie, S Ahmed, MD    Assessment & Plan:   Principal Problem:   Fever Active Problems:   Primary hypertension   Hyperlipidemia   Pre-diabetes   Hypokalemia   Mild protein malnutrition (HCC)   Grade I diastolic dysfunction   Bradycardia, unspecified  Fever: of unknown etiology. WBC is WNL. UA is neg. Urine cx is pending. Blood cxs NGTD. CXR shows no active disease. D/c IV abxs and monitor   Bradycardia: continue on tele. Cardio is recs permanent pacemaker. Dr. Darrold JunkerParaschos consulted for permanent pacemaker, will be possibly done on until 09/27/20  A. fib: likely PAF. CHADsVASc 8. Continue on eliquis. Continue on tele. Cardio previously suggested pacemaker but pt's insurance decline pacemaker  Generalized weakness: PT/OT consulted   Chronic diastolic CHF: evoluemic. Continue on home dose of enalapril. Monitor I/Os  HTN: continue on home dose of enalapril   HLD: continue on statin   Pre-DM: continue to hold home dose of metformin. Continue on SSI w/ accuchecks  Hypokalemia: KCl repleted. Will continue to monitor   Mild protein malnutrition: nutrition consulted    DVT prophylaxis: eliquis Code Status: full  Family Communication: discussed pt's care w/ pt's family at bedside and answered their questions Disposition Plan: depends on PT/OT recs   Status is: Observation  The patient will require care spanning > 2 midnights and should be moved to inpatient because: Ongoing diagnostic testing needed not appropriate for outpatient work up, Unsafe d/c plan and IV treatments appropriate due to intensity of illness or inability to take PO  Dispo: The patient is from: Home              Anticipated d/c is to: Home              Anticipated d/c date is: 3 days              Patient currently is not medically stable to d/c.     Consultants:   Cardio    Procedures:     Antimicrobials:   Subjective: Pt c/o fatigue   Objective: Vitals:   09/24/20 1857 09/24/20 2130 09/24/20 2350 09/25/20 0452  BP: 134/61 125/61 140/70 (!) 136/59  Pulse: (!) 43 (!) 42 (!) 41 (!) 43  Resp: 18 18 18 18   Temp: 99.3 F (37.4 C) 100 F (37.8 C) 99.3 F (37.4 C) 99.4 F (37.4 C)  TempSrc: Oral Oral Oral Oral  SpO2: 98% 97% 99% 98%  Weight:      Height:        Intake/Output Summary (Last 24 hours) at 09/25/2020 0806 Last data filed at 09/25/2020 0238 Gross per 24 hour  Intake 1600 ml  Output 200 ml  Net 1400 ml   Filed Weights   09/24/20 1130  Weight: 109 kg    Examination:  General exam: Appears calm and comfortable  Respiratory system: Clear to auscultation. Respiratory effort normal. Cardiovascular system: bradycardia. No rubs, gallops or clicks. Gastrointestinal system: Abdomen is nondistended, soft and nontender. Normal bowel sounds heard. Central nervous system: Alert and oriented. Moves all 4 extremities  Psychiatry: Judgement and insight appear normal. Flat mood and affect   Data Reviewed: I have personally reviewed following labs and imaging studies  CBC: Recent Labs  Lab 09/24/20 1134 09/25/20 0417  WBC 8.6 7.9  NEUTROABS 7.0 5.4  HGB 13.1 11.1*  HCT 38.6* 31.9*  MCV 90.6 89.4  PLT 206 157  Basic Metabolic Panel: Recent Labs  Lab 09/24/20 1134 09/25/20 0417  NA 136 139  K 3.4* 3.3*  CL 102 108  CO2 24 22  GLUCOSE 207* 103*  BUN 15 14  CREATININE 1.40* 1.12  CALCIUM 9.0 8.1*  MG 1.8  --   PHOS 2.0*  --    GFR: Estimated Creatinine Clearance: 66.1 mL/min (by C-G formula based on SCr of 1.12 mg/dL). Liver Function Tests: Recent Labs  Lab 09/24/20 1134 09/25/20 0417  AST 31 22  ALT 24 18  ALKPHOS 118 92  BILITOT 0.9 0.8  PROT 6.7 5.5*  ALBUMIN 3.4* 2.8*   No results for input(s): LIPASE, AMYLASE in the last 168 hours. No results for input(s): AMMONIA in the last 168 hours. Coagulation Profile: Recent  Labs  Lab 09/24/20 1134  INR 1.3*   Cardiac Enzymes: No results for input(s): CKTOTAL, CKMB, CKMBINDEX, TROPONINI in the last 168 hours. BNP (last 3 results) No results for input(s): PROBNP in the last 8760 hours. HbA1C: No results for input(s): HGBA1C in the last 72 hours. CBG: Recent Labs  Lab 09/18/20 1304 09/18/20 1538 09/24/20 1641 09/25/20 0746  GLUCAP 107* 87 82 102*   Lipid Profile: No results for input(s): CHOL, HDL, LDLCALC, TRIG, CHOLHDL, LDLDIRECT in the last 72 hours. Thyroid Function Tests: No results for input(s): TSH, T4TOTAL, FREET4, T3FREE, THYROIDAB in the last 72 hours. Anemia Panel: No results for input(s): VITAMINB12, FOLATE, FERRITIN, TIBC, IRON, RETICCTPCT in the last 72 hours. Sepsis Labs: Recent Labs  Lab 09/24/20 1134 09/24/20 1259 09/25/20 0417  PROCALCITON <0.10  --  <0.10  LATICACIDVEN 2.7* 1.4  --     Recent Results (from the past 240 hour(s))  Resp Panel by RT-PCR (Flu A&B, Covid) Nasopharyngeal Swab     Status: None   Collection Time: 09/17/20 10:43 PM   Specimen: Nasopharyngeal Swab; Nasopharyngeal(NP) swabs in vial transport medium  Result Value Ref Range Status   SARS Coronavirus 2 by RT PCR NEGATIVE NEGATIVE Final    Comment: (NOTE) SARS-CoV-2 target nucleic acids are NOT DETECTED.  The SARS-CoV-2 RNA is generally detectable in upper respiratory specimens during the acute phase of infection. The lowest concentration of SARS-CoV-2 viral copies this assay can detect is 138 copies/mL. A negative result does not preclude SARS-Cov-2 infection and should not be used as the sole basis for treatment or other patient management decisions. A negative result may occur with  improper specimen collection/handling, submission of specimen other than nasopharyngeal swab, presence of viral mutation(s) within the areas targeted by this assay, and inadequate number of viral copies(<138 copies/mL). A negative result must be combined  with clinical observations, patient history, and epidemiological information. The expected result is Negative.  Fact Sheet for Patients:  BloggerCourse.com  Fact Sheet for Healthcare Providers:  SeriousBroker.it  This test is no t yet approved or cleared by the [place] FDA and  has been authorized for detection and/or diagnosis of SARS-CoV-2 by FDA under an Emergency Use Authorization (EUA). This EUA will remain  in effect (meaning this test can be used) for the duration of the COVID-19 declaration under Section 564(b)(1) of the Act, 21 U.S.C.section 360bbb-3(b)(1), unless the authorization is terminated  or revoked sooner.       Influenza A by PCR NEGATIVE NEGATIVE Final   Influenza B by PCR NEGATIVE NEGATIVE Final    Comment: (NOTE) The Xpert Xpress SARS-CoV-2/FLU/RSV plus assay is intended as an aid in the diagnosis of influenza from Nasopharyngeal swab specimens and  should not be used as a sole basis for treatment. Nasal washings and aspirates are unacceptable for Xpert Xpress SARS-CoV-2/FLU/RSV testing.  Fact Sheet for Patients: BloggerCourse.com  Fact Sheet for Healthcare Providers: SeriousBroker.it  This test is not yet approved or cleared by the [place] FDA and has been authorized for detection and/or diagnosis of SARS-CoV-2 by FDA under an Emergency Use Authorization (EUA). This EUA will remain in effect (meaning this test can be used) for the duration of the COVID-19 declaration under Section 564(b)(1) of the Act, 21 U.S.C. section 360bbb-3(b)(1), unless the authorization is terminated or revoked.  Performed at [facility], [address] [place], [place] [address]   Blood Culture (routine x 2)     Status: None (Preliminary result)   Collection Time: 09/24/20 11:34 AM   Specimen: BLOOD  Result Value Ref Range Status   Specimen  Description BLOOD BLOOD LEFT FOREARM  Final   Special Requests   Final    BOTTLES DRAWN AEROBIC AND ANAEROBIC Blood Culture adequate volume   Culture   Final    NO GROWTH < 24 HOURS Performed at [facility], [address], [place], [place] [address]    Report Status PENDING  Incomplete  Blood Culture (routine x 2)     Status: None (Preliminary result)   Collection Time: 09/24/20 11:34 AM   Specimen: BLOOD  Result Value Ref Range Status   Specimen Description BLOOD LEFT ANTECUBITAL  Final   Special Requests   Final    BOTTLES DRAWN AEROBIC AND ANAEROBIC Blood Culture adequate volume   Culture   Final    NO GROWTH < 24 HOURS Performed at [facility], [address], [place], [place] [address]    Report Status PENDING  Incomplete  Resp Panel by RT-PCR (Flu A&B, Covid) Nasopharyngeal Swab     Status: None   Collection Time: 09/24/20 11:34 AM   Specimen: Nasopharyngeal Swab; Nasopharyngeal(NP) swabs in vial transport medium  Result Value Ref Range Status   SARS Coronavirus 2 by RT PCR NEGATIVE NEGATIVE Final    Comment: (NOTE) SARS-CoV-2 target nucleic acids are NOT DETECTED.  The SARS-CoV-2 RNA is generally detectable in upper respiratory specimens during the acute phase of infection. The lowest concentration of SARS-CoV-2 viral copies this assay can detect is 138 copies/mL. A negative result does not preclude SARS-Cov-2 infection and should not be used as the sole basis for treatment or other patient management decisions. A negative result may occur with  improper specimen collection/handling, submission of specimen other than nasopharyngeal swab, presence of viral mutation(s) within the areas targeted by this assay, and inadequate number of viral copies(<138 copies/mL). A negative result must be combined with clinical observations, patient history, and epidemiological information. The expected result is Negative.  Fact Sheet for Patients:   BloggerCourse.com  Fact Sheet for Healthcare Providers:  SeriousBroker.it  This test is no t yet approved or cleared by the [place] FDA and  has been authorized for detection and/or diagnosis of SARS-CoV-2 by FDA under an Emergency Use Authorization (EUA). This EUA will remain  in effect (meaning this test can be used) for the duration of the COVID-19 declaration under Section 564(b)(1) of the Act, 21 U.S.C.section 360bbb-3(b)(1), unless the authorization is terminated  or revoked sooner.       Influenza A by PCR NEGATIVE NEGATIVE Final   Influenza B by PCR NEGATIVE NEGATIVE Final    Comment: (NOTE) The Xpert Xpress SARS-CoV-2/FLU/RSV plus assay is intended as  an aid in the diagnosis of influenza from Nasopharyngeal swab specimens and should not be used as a sole basis for treatment. Nasal washings and aspirates are unacceptable for Xpert Xpress SARS-CoV-2/FLU/RSV testing.  Fact Sheet for Patients: BloggerCourse.com  Fact Sheet for Healthcare Providers: SeriousBroker.it  This test is not yet approved or cleared by the [place] FDA and has been authorized for detection and/or diagnosis of SARS-CoV-2 by FDA under an Emergency Use Authorization (EUA). This EUA will remain in effect (meaning this test can be used) for the duration of the COVID-19 declaration under Section 564(b)(1) of the Act, 21 U.S.C. section 360bbb-3(b)(1), unless the authorization is terminated or revoked.  Performed at [facility], [address] [place], [place] [address]          Radiology Studies: CT Head Wo Contrast  Result Date: 09/24/2020 CLINICAL DATA:  Head trauma, minor. Additional provided: Patient reports weakness for 2 days, altered mental status per family. Recent fall, taking blood thinners, history of prior stroke EXAM: CT HEAD WITHOUT CONTRAST TECHNIQUE:  Contiguous axial images were obtained from the base of the skull through the vertex without intravenous contrast. COMPARISON:  Brain MRI 09/17/2020.  Head CT 09/17/2020. FINDINGS: Brain: Mild-to-moderate generalized cerebral atrophy. Mild ill-defined hypoattenuation within the cerebral white matter is nonspecific, but compatible with chronic small vessel ischemic disease. There is no acute intracranial hemorrhage. No demarcated cortical infarct. No extra-axial fluid collection. No evidence of intracranial mass. No midline shift. Vascular: No hyperdense vessel. Skull: Normal. Negative for fracture or focal lesion. Sinuses/Orbits: Visualized orbits show no acute finding. Mild ethmoid sinus mucosal thickening. IMPRESSION: No evidence of acute intracranial abnormality. Cerebral atrophy and chronic small vessel ischemic disease, stable. Mild ethmoid sinus mucosal thickening. Electronically Signed   By: Jackey Loge DO   On: 09/24/2020 12:13   DG Chest Port 1 View  Result Date: 09/24/2020 CLINICAL DATA:  Questionable sepsis. Weakness, altered mental status. EXAM: PORTABLE CHEST 1 VIEW COMPARISON:  09/17/2020 FINDINGS: Postoperative changes noted in the left scapula and ribs. Heart is borderline in size. Probable lingular scarring. No acute confluent airspace opacities or effusions. No acute bony abnormality. IMPRESSION: No active disease. Electronically Signed   By: Charlett Nose M.D.   On: 09/24/2020 12:03        Scheduled Meds: . apixaban  5 mg Oral BID  . atorvastatin  80 mg Oral Daily  . cholecalciferol  5,000 Units Oral Daily  . enalapril  2.5 mg Oral Daily  . insulin aspart  0-15 Units Subcutaneous TID WC  . metFORMIN  500 mg Oral Q breakfast  . metroNIDAZOLE  500 mg Oral Q8H  . pantoprazole  40 mg Oral Daily  . Sodium Fluoride  1 application dental BID   Continuous Infusions: . 0.9 % NaCl with KCl 20 mEq / L 88 mL/hr at 09/25/20 0017  . ceFEPime (MAXIPIME) IV 2 g (09/25/20 0641)     LOS:  0 days    Time spent: 34 mins    Charise Killian, MD [facility] Pager [phone]  If 7PM-7AM, please contact night-coverage 09/25/2020, 8:06 AM

## 2020-09-25 NOTE — Progress Notes (Signed)
Pt calm and cooperative but forgetful and confused. Generalized weakness noted. Attempted to get out of bed several times to void. Condom cath in place. On IVF and IV Abx. Safety measures in place. Daughter at bedside and very supportive. Will continue to monitor.

## 2020-09-25 NOTE — Progress Notes (Signed)
OT Cancellation Note  Patient Details Name: Alexander Kline. MRN: [id number] DOB: 1941-07-17   Cancelled Treatment:    Reason Eval/Treat Not Completed: Medical issues which prohibited therapy. Consult received, chart reviewed. Per cardiology, pt requiring permament pacemaker. Will hold OT evaluation at this time and re-attempt as medically appropriate pending updated plan of care.   Richrd Prime, MPH, MS, OTR/L ascom [phone] 09/25/20, 1:04 PM

## 2020-09-26 DIAGNOSIS — R001 Bradycardia, unspecified: Secondary | ICD-10-CM | POA: Diagnosis not present

## 2020-09-26 DIAGNOSIS — I10 Essential (primary) hypertension: Secondary | ICD-10-CM | POA: Diagnosis not present

## 2020-09-26 DIAGNOSIS — N179 Acute kidney failure, unspecified: Secondary | ICD-10-CM | POA: Diagnosis not present

## 2020-09-26 LAB — CBC
HCT: 35.9 % — ABNORMAL LOW (ref 39.0–52.0)
Hemoglobin: 12.5 g/dL — ABNORMAL LOW (ref 13.0–17.0)
MCH: 31 pg (ref 26.0–34.0)
MCHC: 34.8 g/dL (ref 30.0–36.0)
MCV: 89.1 fL (ref 80.0–100.0)
Platelets: 180 10*3/uL (ref 150–400)
RBC: 4.03 MIL/uL — ABNORMAL LOW (ref 4.22–5.81)
RDW: 13.8 % (ref 11.5–15.5)
WBC: 8.1 10*3/uL (ref 4.0–10.5)
nRBC: 0 % (ref 0.0–0.2)

## 2020-09-26 LAB — URINE CULTURE: Culture: <10000 — AB

## 2020-09-26 LAB — BASIC METABOLIC PANEL
Anion gap: 7 (ref 5–15)
BUN: 16 mg/dL (ref 8–23)
CO2: 23 mmol/L (ref 22–32)
Calcium: 8.5 mg/dL — ABNORMAL LOW (ref 8.9–10.3)
Chloride: 107 mmol/L (ref 98–111)
Creatinine, Ser: 1.3 mg/dL — ABNORMAL HIGH (ref 0.61–1.24)
GFR, Estimated: 56 mL/min — ABNORMAL LOW (ref >60)
Glucose, Bld: 99 mg/dL (ref 70–99)
Potassium: 3.7 mmol/L (ref 3.5–5.1)
Sodium: 137 mmol/L (ref 135–145)

## 2020-09-26 LAB — PROCALCITONIN: Procalcitonin: <0.1 ng/mL

## 2020-09-26 LAB — GLUCOSE, CAPILLARY
Glucose-Capillary: 153 mg/dL — ABNORMAL HIGH (ref 70–99)
Glucose-Capillary: 120 mg/dL — ABNORMAL HIGH (ref 70–99)
Glucose-Capillary: 108 mg/dL — ABNORMAL HIGH (ref 70–99)
Glucose-Capillary: 112 mg/dL — ABNORMAL HIGH (ref 70–99)

## 2020-09-26 NOTE — Evaluation (Signed)
Occupational Therapy Evaluation Patient Details Name: Alexander Kline. MRN: [id number] DOB: June 16, 1941 Today's Date: 09/26/2020    History of Present Illness  Blong Busk. is a 79 y.o. male with medical history significant of hyperlipidemia, hypertension, prediabetes, history of other nonhemorrhagic CVA, grade 1 diastolic dysfunction who was admitted overnight last week due to what was thought to be a TIA after imaging was negative for CVA versus symptomatic bradycardia causing brain hypoperfusion who today is coming to the emergency department due to weakness for the past 2 days associated with decreased mentation.     Clinical Impression   Mr Mark was seen for OT evaluation this date. Prior to hospital admission, pt was Independent in mobility and I/ADLs including driving. Pt lives alone in split level home c 8 STE + L rail, and bed/bath on second floor requiring 8 steps + R rail. Pt presents to acute OT demonstrating impaired ADL performance and functional mobility 2/2 decreased activity tolerance and functional strength/ROM/balance deficits. Pt currently requires Increased time don B socks at bed level and sup>sit. SBA + RW for toilet t/f - VCs for hand placement. HR 70-80s t/o session. Pt and family instructed in DME recs, d/c recs, falls prevention, ECS, IS (provided) frequency and use, and HEP. Pt would benefit from skilled OT to address noted impairments and functional limitations (see below for any additional details) in order to maximize safety and independence while minimizing falls risk and caregiver burden. Upon hospital discharge, recommend HHOT to maximize pt safety and return to functional independence during meaningful occupations of daily life.     Follow Up Recommendations  Home health OT    Equipment Recommendations  3 in 1 bedside commode    Recommendations for Other Services       Precautions / Restrictions Precautions Precautions: Fall Precaution Comments:  watch HR Restrictions Weight Bearing Restrictions: No      Mobility Bed Mobility Overal bed mobility: Modified Independent    General bed mobility comments: Increased time, HoB elevated, and rails use to exit bed w/o physical assist    Transfers Overall transfer level: Needs assistance Equipment used: Rolling walker (2 wheeled) Transfers: Sit to/from Stand Sit to Stand: Supervision         General transfer comment: SBA + RW - VCs for hand placement    Balance Overall balance assessment: Needs assistance Sitting-balance support: Feet supported Sitting balance-Leahy Scale: Fair     Standing balance support: Bilateral upper extremity supported Standing balance-Leahy Scale: Fair              ADL either performed or assessed with clinical judgement   ADL Overall ADL's : Needs assistance/impaired        General ADL Comments: Increased time don B socks at bed level. SBA + RW for toilet t/f.     Vision Baseline Vision/History: Wears glasses Wears Glasses: At all times              Pertinent Vitals/Pain Pain Assessment: No/denies pain     Hand Dominance Right   Extremity/Trunk Assessment Upper Extremity Assessment Upper Extremity Assessment: Generalized weakness   Lower Extremity Assessment Lower Extremity Assessment: Generalized weakness       Communication Communication Communication: No difficulties   Cognition Arousal/Alertness: Awake/alert Behavior During Therapy: WFL for tasks assessed/performed Overall Cognitive Status: Within Functional Limits for tasks assessed                  General Comments  HR 70-80s t/o  session     Exercises Exercises: Other exercises Other Exercises Other Exercises: Pt and family educated re: OT role, DME recs, d/c recs, falls prevention, ECS, importance of mobility for functional strengthening, IS (provided) frequency and use, HEP Other Exercises: LBD, UBD, sup>sit, sit<>stand x2, sitting/standing  balance/toelrance, ~20 ft mobility   Shoulder Instructions      Home Living Family/patient expects to be discharged to:: Private residence Living Arrangements: Alone Available Help at Discharge: Family;Available PRN/intermittently Type of Home: House Home Access: Stairs to enter [employer] of Steps: 8 Entrance Stairs-Rails: Left Home Layout: Two level;Other (Comment) (split level home) Alternate Level Stairs-Number of Steps: Pt has 8 steps with R ascending rail upstairs to bedrooms, middle level is kitchen and living area and then 8 steps downstairs for laundry in basement             Home Equipment: Cane - single point          Prior Functioning/Environment Level of Independence: Independent        Comments: Independent mobility and I/ADLs. Family in room reports 1 fall ~1 week ago (unsure confusion vs syncope)        OT Problem List: Decreased strength;Decreased range of motion;Decreased activity tolerance;Decreased knowledge of use of DME or AE;Impaired balance (sitting and/or standing)      OT Treatment/Interventions: Self-care/ADL training;Therapeutic exercise;Energy conservation;DME and/or AE instruction;Therapeutic activities;Patient/family education;Balance training    OT Goals(Current goals can be found in the care plan section) Acute Rehab OT Goals Patient Stated Goal: to go home OT Goal Formulation: With patient/family Time For Goal Achievement: 10/10/20 Potential to Achieve Goals: Good ADL Goals Pt Will Perform Grooming: Independently;standing Pt Will Perform Lower Body Dressing: Independently;sit to/from stand Pt Will Transfer to Toilet: Independently;ambulating;regular height toilet Additional ADL Goal #1: Pt will Independently verbalize plan to implement x3 ECS  OT Frequency: Min 1X/week   Barriers to D/C: Inaccessible home environment;Decreased caregiver support             AM-PAC OT "6 Clicks" Daily Activity     Outcome Measure  Help from another person eating meals?: None Help from another person taking care of personal grooming?: A Little Help from another person toileting, which includes using toliet, bedpan, or urinal?: A Little Help from another person bathing (including washing, rinsing, drying)?: A Little Help from another person to put on and taking off regular upper body clothing?: None Help from another person to put on and taking off regular lower body clothing?: A Little 6 Click Score: 20   End of Session Equipment Utilized During Treatment: Rolling walker Nurse Communication: Mobility status  Activity Tolerance: Patient tolerated treatment well Patient left: in chair;with call [place]/phone within reach;with chair alarm set;with family/visitor present  OT Visit Diagnosis: Other abnormalities of gait and mobility (R26.89)                Time: 5993-5701 OT Time Calculation (min): 32 min Charges:  OT General Charges $OT Visit: 1 Visit OT Evaluation $OT Eval Moderate Complexity: 1 Mod OT Treatments $Self Care/Home Management : 23-37 mins  Kathie Dike, M.S. OTR/L  09/26/20, 3:31 PM  ascom [phone]

## 2020-09-26 NOTE — Progress Notes (Signed)
PROGRESS NOTE    Alexander Meadowaymond E Nier Jr.  WUJ:[id number] DOB: 05/12/1941 DOA: 09/24/2020 PCP: Sherron Mondayejan-Sie, S Ahmed, MD    Assessment & Plan:   Principal Problem:   Fever Active Problems:   Primary hypertension   Hyperlipidemia   Pre-diabetes   Hypokalemia   Mild protein malnutrition (HCC)   Grade I diastolic dysfunction   Bradycardia, unspecified   Bradycardia  Fever: of unknown etiology. No fevers in at least 48 hours. WBC is WNL. UA is neg. Urine cx shows insignificant growth. Blood cxs NGTD. CXR shows no active disease. IV abxs were d/c and will continue to monitor  Bradycardia: continue on tele. Cardio recs permanent pacemaker. Dr. Darrold JunkerParaschos consulted for permanent pacemaker, will be possibly done on until 09/28/20  A. fib: likely PAF. CHADsVASc 8. Continue on eliquis. Continue on tele. 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 Cr is labile. Will continue to monitor   Generalized weakness: PT/OT consulted but not working with pt until pacemake is implanted   Chronic diastolic CHF: evoluemic. Continue on home dose of enalapril. Monitor I/Os  HTN: continue on home dose of enalapril   HLD: continue on statin   Pre-DM: continue on SSI w/ accuchecks. Continue to hold home dose of metformin   Hypokalemia: WNL today.  Mild protein malnutrition: nutrition consulted     DVT prophylaxis: eliquis Code Status: full  Family Communication: discussed pt's care w/ pt's family at bedside and answered their questions Disposition Plan: depends on PT/OT recs   Status is: Observation  The patient will require care spanning > 2 midnights and should be moved to inpatient because: Ongoing diagnostic testing needed not appropriate for outpatient work up, Unsafe d/c plan and IV treatments appropriate due to intensity of illness or inability to take PO  Dispo: The patient is from: Home              Anticipated d/c  is to: Home              Anticipated d/c date is: 3 days              Patient currently is not medically stable to d/c.     Consultants:   Cardio    Procedures:    Antimicrobials:   Subjective: Pt c/o headache   Objective: Vitals:   09/25/20 1533 09/25/20 1720 09/25/20 1930 09/25/20 2341  BP: (!) 173/73 (!) 166/78 (!) 127/52 (!) 138/55  Pulse: 88 (!) 58 89 70  Resp: 20  18 20   Temp: 98.4 F (36.9 C)  99.3 F (37.4 C) 98.9 F (37.2 C)  TempSrc:   Oral Oral  SpO2: 98%  97% 96%  Weight:      Height:        Intake/Output Summary (Last 24 hours) at 09/26/2020 0751 Last data filed at 09/25/2020 1800 Gross per 24 hour  Intake 2050.56 ml  Output 1000 ml  Net 1050.56 ml   Filed Weights   09/24/20 1130  Weight: 109 kg    Examination:  General exam: Appears calm but uncomfortable  Respiratory system: clear breath sounds b/l. No wheezes, rales Cardiovascular system: Bradycardia. No rubs, clicks or gallops Gastrointestinal system: Abd is soft, obese, non-tender & hypoactive bowel sounds  Central nervous system: Alert and oriented. Moves all 4 extremities  Psychiatry: Judgement and insight appear normal. Flat mood and affect   Data Reviewed: I have personally reviewed following labs and imaging studies  CBC: Recent Labs  Lab 09/24/20 1134 09/25/20 0417 09/26/20 0311  WBC 8.6 7.9 8.1  NEUTROABS 7.0 5.4  --   HGB 13.1 11.1* 12.5*  HCT 38.6* 31.9* 35.9*  MCV 90.6 89.4 89.1  PLT 206 157 180   Basic Metabolic Panel: Recent Labs  Lab 09/24/20 1134 09/25/20 0417 09/26/20 0311  NA 136 139 137  K 3.4* 3.3* 3.7  CL 102 108 107  CO2 24 22 23   GLUCOSE 207* 103* 99  BUN 15 14 16   CREATININE 1.40* 1.12 1.30*  CALCIUM 9.0 8.1* 8.5*  MG 1.8  --   --   PHOS 2.0*  --   --    GFR: Estimated Creatinine Clearance: 56.9 mL/min (A) (by C-G formula based on SCr of 1.3 mg/dL (H)). Liver Function Tests: Recent Labs  Lab 09/24/20 1134 09/25/20 0417  AST 31 22    ALT 24 18  ALKPHOS 118 92  BILITOT 0.9 0.8  PROT 6.7 5.5*  ALBUMIN 3.4* 2.8*   No results for input(s): LIPASE, AMYLASE in the last 168 hours. No results for input(s): AMMONIA in the last 168 hours. Coagulation Profile: Recent Labs  Lab 09/24/20 1134  INR 1.3*   Cardiac Enzymes: No results for input(s): CKTOTAL, CKMB, CKMBINDEX, TROPONINI in the last 168 hours. BNP (last 3 results) No results for input(s): PROBNP in the last 8760 hours. HbA1C: No results for input(s): HGBA1C in the last 72 hours. CBG: Recent Labs  Lab 09/25/20 0746 09/25/20 1154 09/25/20 1534 09/25/20 2119 09/25/20 2132  GLUCAP 102* 104* 76 108* 99   Lipid Profile: No results for input(s): CHOL, HDL, LDLCALC, TRIG, CHOLHDL, LDLDIRECT in the last 72 hours. Thyroid Function Tests: No results for input(s): TSH, T4TOTAL, FREET4, T3FREE, THYROIDAB in the last 72 hours. Anemia Panel: No results for input(s): VITAMINB12, FOLATE, FERRITIN, TIBC, IRON, RETICCTPCT in the last 72 hours. Sepsis Labs: Recent Labs  Lab 09/24/20 1134 09/24/20 1259 09/25/20 0417 09/26/20 0311  PROCALCITON <0.10  --  <0.10 <0.10  LATICACIDVEN 2.7* 1.4  --   --     Recent Results (from the past 240 hour(s))  Resp Panel by RT-PCR (Flu A&B, Covid) Nasopharyngeal Swab     Status: None   Collection Time: 09/17/20 10:43 PM   Specimen: Nasopharyngeal Swab; Nasopharyngeal(NP) swabs in vial transport medium  Result Value Ref Range Status   SARS Coronavirus 2 by RT PCR NEGATIVE NEGATIVE Final    Comment: (NOTE) SARS-CoV-2 target nucleic acids are NOT DETECTED.  The SARS-CoV-2 RNA is generally detectable in upper respiratory specimens during the acute phase of infection. The lowest concentration of SARS-CoV-2 viral copies this assay can detect is 138 copies/mL. A negative result does not preclude SARS-Cov-2 infection and should not be used as the sole basis for treatment or other patient management decisions. A negative result may  occur with  improper specimen collection/handling, submission of specimen other than nasopharyngeal swab, presence of viral mutation(s) within the areas targeted by this assay, and inadequate number of viral copies(<138 copies/mL). A negative result must be combined with clinical observations, patient history, and epidemiological information. The expected result is Negative.  Fact Sheet for Patients:  09/28/20  Fact Sheet for Healthcare Providers:  09/19/20  This test is no t yet approved or cleared by the [employer] FDA and  has been authorized for detection and/or diagnosis of SARS-CoV-2 by FDA under an Emergency Use Authorization (EUA). This EUA will remain  in effect (meaning this test can be used)  for the duration of the COVID-19 declaration under Section 564(b)(1) of the Act, 21 U.S.C.section 360bbb-3(b)(1), unless the authorization is terminated  or revoked sooner.       Influenza A by PCR NEGATIVE NEGATIVE Final   Influenza B by PCR NEGATIVE NEGATIVE Final    Comment: (NOTE) The Xpert Xpress SARS-CoV-2/FLU/RSV plus assay is intended as an aid in the diagnosis of influenza from Nasopharyngeal swab specimens and should not be used as a sole basis for treatment. Nasal washings and aspirates are unacceptable for Xpert Xpress SARS-CoV-2/FLU/RSV testing.  Fact Sheet for Patients: [employer]  Fact Sheet for Healthcare Providers: SeriousBroker.it  This test is not yet approved or cleared by the [place] FDA and has been authorized for detection and/or diagnosis of SARS-CoV-2 by FDA under an Emergency Use Authorization (EUA). This EUA will remain in effect (meaning this test can be used) for the duration of the COVID-19 declaration under Section 564(b)(1) of the Act, 21 U.S.C. section 360bbb-3(b)(1), unless the authorization is terminated  or revoked.  Performed at [facility], [address] [place], [place] [address]   Blood Culture (routine x 2)     Status: None (Preliminary result)   Collection Time: 09/24/20 11:34 AM   Specimen: BLOOD  Result Value Ref Range Status   Specimen Description BLOOD BLOOD LEFT FOREARM  Final   Special Requests   Final    BOTTLES DRAWN AEROBIC AND ANAEROBIC Blood Culture adequate volume   Culture   Final    NO GROWTH 2 DAYS Performed at [facility], [address] [place], [place] [address]    Report Status PENDING  Incomplete  Blood Culture (routine x 2)     Status: None (Preliminary result)   Collection Time: 09/24/20 11:34 AM   Specimen: BLOOD  Result Value Ref Range Status   Specimen Description BLOOD LEFT ANTECUBITAL  Final   Special Requests   Final    BOTTLES DRAWN AEROBIC AND ANAEROBIC Blood Culture adequate volume   Culture   Final    NO GROWTH 2 DAYS Performed at [facility], [address] [place], [place] [address]    Report Status PENDING  Incomplete  Resp Panel by RT-PCR (Flu A&B, Covid) Nasopharyngeal Swab     Status: None   Collection Time: 09/24/20 11:34 AM   Specimen: Nasopharyngeal Swab; Nasopharyngeal(NP) swabs in vial transport medium  Result Value Ref Range Status   SARS Coronavirus 2 by RT PCR NEGATIVE NEGATIVE Final    Comment: (NOTE) SARS-CoV-2 target nucleic acids are NOT DETECTED.  The SARS-CoV-2 RNA is generally detectable in upper respiratory specimens during the acute phase of infection. The lowest concentration of SARS-CoV-2 viral copies this assay can detect is 138 copies/mL. A negative result does not preclude SARS-Cov-2 infection and should not be used as the sole basis for treatment or other patient management decisions. A negative result may occur with  improper specimen collection/handling, submission of specimen other than nasopharyngeal swab, presence of viral mutation(s) within the areas targeted by  this assay, and inadequate number of viral copies(<138 copies/mL). A negative result must be combined with clinical observations, patient history, and epidemiological information. The expected result is Negative.  Fact Sheet for Patients:  [employer]Course[employer]com  Fact Sheet for Healthcare Providers:  SeriousBroker.it  This test is no t yet approved or cleared by the [place] FDA and  has been authorized for detection and/or diagnosis of SARS-CoV-2 by FDA under an Emergency Use Authorization (EUA). This EUA will  remain  in effect (meaning this test can be used) for the duration of the COVID-19 declaration under Section 564(b)(1) of the Act, 21 U.S.C.section 360bbb-3(b)(1), unless the authorization is terminated  or revoked sooner.       Influenza A by PCR NEGATIVE NEGATIVE Final   Influenza B by PCR NEGATIVE NEGATIVE Final    Comment: (NOTE) The Xpert Xpress SARS-CoV-2/FLU/RSV plus assay is intended as an aid in the diagnosis of influenza from Nasopharyngeal swab specimens and should not be used as a sole basis for treatment. Nasal washings and aspirates are unacceptable for Xpert Xpress SARS-CoV-2/FLU/RSV testing.  Fact Sheet for Patients: BloggerCourse.com  Fact Sheet for Healthcare Providers: SeriousBroker.it  This test is not yet approved or cleared by the [place] FDA and has been authorized for detection and/or diagnosis of SARS-CoV-2 by FDA under an Emergency Use Authorization (EUA). This EUA will remain in effect (meaning this test can be used) for the duration of the COVID-19 declaration under Section 564(b)(1) of the Act, 21 U.S.C. section 360bbb-3(b)(1), unless the authorization is terminated or revoked.  Performed at [facility], [address] [place], [place] [address]          Radiology Studies: CT Head Wo Contrast  Result Date:  09/24/2020 CLINICAL DATA:  Head trauma, minor. Additional provided: Patient reports weakness for 2 days, altered mental status per family. Recent fall, taking blood thinners, history of prior stroke EXAM: CT HEAD WITHOUT CONTRAST TECHNIQUE: Contiguous axial images were obtained from the base of the skull through the vertex without intravenous contrast. COMPARISON:  Brain MRI 09/17/2020.  Head CT 09/17/2020. FINDINGS: Brain: Mild-to-moderate generalized cerebral atrophy. Mild ill-defined hypoattenuation within the cerebral white matter is nonspecific, but compatible with chronic small vessel ischemic disease. There is no acute intracranial hemorrhage. No demarcated cortical infarct. No extra-axial fluid collection. No evidence of intracranial mass. No midline shift. Vascular: No hyperdense vessel. Skull: Normal. Negative for fracture or focal lesion. Sinuses/Orbits: Visualized orbits show no acute finding. Mild ethmoid sinus mucosal thickening. IMPRESSION: No evidence of acute intracranial abnormality. Cerebral atrophy and chronic small vessel ischemic disease, stable. Mild ethmoid sinus mucosal thickening. Electronically Signed   By: Jackey Loge DO   On: 09/24/2020 12:13   DG Chest Port 1 View  Result Date: 09/24/2020 CLINICAL DATA:  Questionable sepsis. Weakness, altered mental status. EXAM: PORTABLE CHEST 1 VIEW COMPARISON:  09/17/2020 FINDINGS: Postoperative changes noted in the left scapula and ribs. Heart is borderline in size. Probable lingular scarring. No acute confluent airspace opacities or effusions. No acute bony abnormality. IMPRESSION: No active disease. Electronically Signed   By: Charlett Nose M.D.   On: 09/24/2020 12:03        Scheduled Meds: . apixaban  5 mg Oral BID  . atorvastatin  80 mg Oral Daily  . cholecalciferol  5,000 Units Oral Daily  . enalapril  2.5 mg Oral Daily  . insulin aspart  0-15 Units Subcutaneous TID WC  . metroNIDAZOLE  500 mg Oral Q8H  . pantoprazole  40 mg  Oral Daily   Continuous Infusions:    LOS: 1 day    Time spent: 31 mins    Charise Killian, MD [facility] Pager [phone]  If 7PM-7AM, please contact night-coverage 09/26/2020, 7:51 AM

## 2020-09-26 NOTE — Evaluation (Signed)
Physical Therapy Evaluation Patient Details Name: Alexander Kline. MRN: [id number] DOB: September 12, 1941 Today's Date: 09/26/2020   History of Present Illness  Pt is an 79 y.o. male presenting to hospital 11/26 with generalized weakness and fatigue; difficulty walking; fall about 1 week ago; more confused recently.  Pt admitted with fever, bradycardia, a-fib, and generalized weakness.  Plan for permanent pacemaker during hospitalization.  PMH includes htn, HLD, h/o CVA, sinus bradycardia.  Clinical Impression  MD Fath (cardiology) cleared pt for mobilization with therapy.  Prior to hospital admission, pt was independent with ambulation; lives alone in home with multiple steps to navigate; pt's daughter able to stay with pt initially to assist as needed.  Currently pt is SBA with transfers and CGA ambulating 160 feet (no UE support).  Decreased weight shift and stance time noted R LE (pt reporting no R hip/groin pain during session; pt's daughter reports pt has had difficulties with walking since R hip fx surgery in 2018).  Pt's HR 70-80 bpm at rest beginning of session; HR 81-98 bpm with ambulation; and pt's HR 65-75 bpm at rest end of session sitting in recliner.  Pt would benefit from skilled PT to address noted impairments and functional limitations (see below for any additional details).  Upon hospital discharge, pt would benefit from HHPT.    Follow Up Recommendations Home health PT    Equipment Recommendations  None recommended by PT (pt has RW at home if needed)    Recommendations for Other Services       Precautions / Restrictions Precautions Precautions: Fall Precaution Comments: monitor HR Restrictions Weight Bearing Restrictions: No      Mobility  Bed Mobility             General bed mobility comments: Deferred (pt sitting in recliner beginning/end of session)    Transfers Overall transfer level: Needs assistance Equipment used: None Transfers: Sit to/from Stand Sit  to Stand: Supervision         General transfer comment: fairly strong stand; pt holding onto door handle briefly after standing  Ambulation/Gait Ambulation/Gait assistance: Min guard Gait Distance (Feet): 160 Feet Assistive device: None   Gait velocity: mildly increased   General Gait Details: decreased weight shift and stance time R LE  Stairs            Wheelchair Mobility    Modified Rankin (Stroke Patients Only)       Balance Overall balance assessment: Needs assistance Sitting-balance support: No upper extremity supported;Feet supported Sitting balance-Leahy Scale: Good Sitting balance - Comments: steady sitting reaching within BOS   Standing balance support: No upper extremity supported;During functional activity Standing balance-Leahy Scale: Good Standing balance comment: no loss of balance during ambulation                             Pertinent Vitals/Pain Pain Assessment: No/denies pain Pain Intervention(s): Limited activity within patient's tolerance;Monitored during session;Repositioned  O2 sats WFL on room air during sessions activities.    Home Living Family/patient expects to be discharged to:: Private residence Living Arrangements: Alone Available Help at Discharge: Family;Available PRN/intermittently Type of Home: House Home Access: Stairs to enter Entrance Stairs-Rails: Left Entrance Stairs-Number of Steps: 8 (from front entrance) Home Layout: Two level;Other (Comment) (split level home) Home Equipment: Cane - single point;Walker - 2 wheels      Prior Function Level of Independence: Independent         Comments: Independent  with functional mobility and I/ADLs. Per OT eval "Family in room reports 1 fall ~1 week ago (unsure confusion vs syncope)"     Hand Dominance   Dominant Hand: Right    Extremity/Trunk Assessment   Upper Extremity Assessment Upper Extremity Assessment: Generalized weakness    Lower Extremity  Assessment Lower Extremity Assessment: Generalized weakness    Cervical / Trunk Assessment Cervical / Trunk Assessment: Normal  Communication   Communication: No difficulties  Cognition Arousal/Alertness: Awake/alert Behavior During Therapy: WFL for tasks assessed/performed Overall Cognitive Status: Impaired/Different from baseline                                 General Comments: Pt reporting it was February 26th 1922 (pt's daughter reports sometimes pt does get the date wrong); oriented to person, place, and general situation.  Increased time required for processing.      General Comments Nursing cleared pt for participation in physical therapy.  Pt agreeable to PT session.  Pt's daughter present during session.    Exercises    Assessment/Plan    PT Assessment Patient needs continued PT services  PT Problem List Decreased strength;Decreased activity tolerance;Decreased balance;Decreased mobility;Decreased cognition;Decreased knowledge of use of DME;Cardiopulmonary status limiting activity;Decreased knowledge of precautions       PT Treatment Interventions DME instruction;Gait training;Stair training;Functional mobility training;Therapeutic activities;Therapeutic exercise;Balance training;Patient/family education    PT Goals (Current goals can be found in the Care Plan section)  Acute Rehab PT Goals Patient Stated Goal: to go home PT Goal Formulation: With patient Time For Goal Achievement: 10/10/20 Potential to Achieve Goals: Good    Frequency Min 2X/week   Barriers to discharge        Co-evaluation               AM-PAC PT "6 Clicks" Mobility  Outcome Measure Help needed turning from your back to your side while in a flat bed without using bedrails?: None Help needed moving from lying on your back to sitting on the side of a flat bed without using bedrails?: None Help needed moving to and from a bed to a chair (including a wheelchair)?: A  Little Help needed standing up from a chair using your arms (e.g., wheelchair or bedside chair)?: A Little Help needed to walk in hospital room?: A Little Help needed climbing 3-5 steps with a railing? : A Little 6 Click Score: 20    End of Session Equipment Utilized During Treatment: Gait belt Activity Tolerance: Patient tolerated treatment well Patient left: in chair;with call [place]/phone within reach;with chair alarm set;with family/visitor present Nurse Communication: Mobility status;Precautions PT Visit Diagnosis: Other abnormalities of gait and mobility (R26.89);Muscle weakness (generalized) (M62.81);History of falling (Z91.81)    Time: 9480-1655 PT Time Calculation (min) (ACUTE ONLY): 25 min   Charges:   PT Evaluation $PT Eval Low Complexity: 1 Low PT Treatments $Therapeutic Exercise: 8-22 mins       [name], PT 09/26/20, 4:08 PM

## 2020-09-26 NOTE — Progress Notes (Signed)
Reviewed outpatient records as well as inpatient consult.  Discussed case at length with Dr. Mayford Knife as well as patient and family member.  Patient will require a dual-chamber permanent pacemaker.  Received apixaban this morning.  Will need to defer pacemaker placement until November 30.  This is scheduled to be placed per Dr. Darrold Junker with a dual-chamber device.  Would hold apixaban until this is placed.

## 2020-09-26 NOTE — Plan of Care (Signed)
Continuing the plan of care.

## 2020-09-27 DIAGNOSIS — E785 Hyperlipidemia, unspecified: Secondary | ICD-10-CM | POA: Diagnosis not present

## 2020-09-27 DIAGNOSIS — I10 Essential (primary) hypertension: Secondary | ICD-10-CM | POA: Diagnosis not present

## 2020-09-27 LAB — BASIC METABOLIC PANEL
Anion gap: 9 (ref 5–15)
BUN: 18 mg/dL (ref 8–23)
CO2: 25 mmol/L (ref 22–32)
Calcium: 8.9 mg/dL (ref 8.9–10.3)
Chloride: 108 mmol/L (ref 98–111)
Creatinine, Ser: 1.34 mg/dL — ABNORMAL HIGH (ref 0.61–1.24)
GFR, Estimated: 54 mL/min — ABNORMAL LOW (ref >60)
Glucose, Bld: 103 mg/dL — ABNORMAL HIGH (ref 70–99)
Potassium: 3.8 mmol/L (ref 3.5–5.1)
Sodium: 142 mmol/L (ref 135–145)

## 2020-09-27 LAB — GLUCOSE, CAPILLARY
Glucose-Capillary: 95 mg/dL (ref 70–99)
Glucose-Capillary: 110 mg/dL — ABNORMAL HIGH (ref 70–99)
Glucose-Capillary: 105 mg/dL — ABNORMAL HIGH (ref 70–99)
Glucose-Capillary: 85 mg/dL (ref 70–99)

## 2020-09-27 LAB — CBC
HCT: 38.2 % — ABNORMAL LOW (ref 39.0–52.0)
Hemoglobin: 12.8 g/dL — ABNORMAL LOW (ref 13.0–17.0)
MCH: 30.9 pg (ref 26.0–34.0)
MCHC: 33.5 g/dL (ref 30.0–36.0)
MCV: 92.3 fL (ref 80.0–100.0)
Platelets: 191 10*3/uL (ref 150–400)
RBC: 4.14 MIL/uL — ABNORMAL LOW (ref 4.22–5.81)
RDW: 13.8 % (ref 11.5–15.5)
WBC: 7.3 10*3/uL (ref 4.0–10.5)
nRBC: 0 % (ref 0.0–0.2)

## 2020-09-27 LAB — SURGICAL PCR SCREEN
MRSA, PCR: NEGATIVE
Staphylococcus aureus: NEGATIVE

## 2020-09-27 MED ORDER — SODIUM CHLORIDE 0.9 % IV SOLN
INTRAVENOUS | Status: DC | PRN
  Administered 2020-09-28: 250 mL via INTRAVENOUS

## 2020-09-27 NOTE — Progress Notes (Signed)
PROGRESS NOTE    Alexander Kline.  KNL:[id number] DOB: 23-Jul-1941 DOA: 09/24/2020 PCP: Sherron Monday, MD    Assessment & Plan:   Principal Problem:   Bradycardia, unspecified Active Problems:   Primary hypertension   Hyperlipidemia   Pre-diabetes   Hypokalemia   Mild protein malnutrition (HCC)   Grade I diastolic dysfunction   Bradycardia  Bradycardia: continue on tele. Will have permanent pacemaker placed 09/28/20 as per cardio   Fever: of unknown etiology. No more fevers. WBC is WNL. UA is neg. Urine cx shows insignificant growth. Blood cxs NGTD. CXR shows no active disease. IV abxs were d/c and will continue to monitor. Resolved  A. fib: likely PAF. CHADsVASc 8. Continue on eliquis. Continue on tele. 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  Cr is labile. Avoid nephrotoxic meds  Generalized weakness: PT/OT consulted but not working with pt until pacemake is implanted   Chronic diastolic CHF: evoluemic. Continue on home dose of enalapril. Monitor I/Os  HTN: continue on home dose of enalapril   HLD: continue on statin   Pre-DM: continue on SSI w/ accuchecks. Continue to hold home dose of metformin   Hypokalemia: within normal limits  Mild protein malnutrition: continue w/ nutritional supplements     DVT prophylaxis: eliquis Code Status: full  Family Communication: discussed pt's care w/ pt's family at bedside and answered their questions Disposition Plan: d/c back home as pt refused home health   Status is: Observation  The patient will require care spanning > 2 midnights and should be moved to inpatient because: Ongoing diagnostic testing needed not appropriate for outpatient work up, Unsafe d/c plan and IV treatments appropriate due to intensity of illness or inability to take PO, will have pacemaker placed tomorrow   Dispo: The patient is from: Home              Anticipated  d/c is to: Home              Anticipated d/c date is: 3 days              Patient currently is not medically stable to d/c.     Consultants:   Cardio    Procedures:    Antimicrobials:   Subjective: Pt c/o fatigue   Objective: Vitals:   09/26/20 1200 09/26/20 1900 09/26/20 2353 09/27/20 0554  BP: 132/76 130/72 131/72 132/85  Pulse: 72 61 (!) 53 (!) 59  Resp:  18  18  Temp: 98.2 F (36.8 C) 98.3 F (36.8 C) 97.7 F (36.5 C) 98.5 F (36.9 C)  TempSrc: Oral Oral Oral Oral  SpO2: 97% 95% 95% 96%  Weight:      Height:        Intake/Output Summary (Last 24 hours) at 09/27/2020 0814 Last data filed at 09/26/2020 1900 Gross per 24 hour  Intake 720 ml  Output 500 ml  Net 220 ml   Filed Weights   09/24/20 1130  Weight: 109 kg    Examination:  General exam: Appears calm & comfortable  Respiratory system: clear breath sounds b/l. No wheezes, rales Cardiovascular system: Bradycardia. No rubs or clicks  Gastrointestinal system: Abd is soft, obese, non-tender & hypoactive bowel sounds  Central nervous system: Alert and oriented. Moves all 4 extremities  Psychiatry: Judgement and insight appear normal. Flat mood and affect    Data Reviewed: I have personally reviewed following labs and imaging studies  CBC: Recent Labs  Lab 09/24/20 1134 09/25/20 0417 09/26/20 0311 09/27/20 0314  WBC 8.6 7.9 8.1 7.3  NEUTROABS 7.0 5.4  --   --   HGB 13.1 11.1* 12.5* 12.8*  HCT 38.6* 31.9* 35.9* 38.2*  MCV 90.6 89.4 89.1 92.3  PLT 206 157 180 191   Basic Metabolic Panel: Recent Labs  Lab 09/24/20 1134 09/25/20 0417 09/26/20 0311 09/27/20 0314  NA 136 139 137 142  K 3.4* 3.3* 3.7 3.8  CL 102 108 107 108  CO2 24 22 23 25   GLUCOSE 207* 103* 99 103*  BUN 15 14 16 18   CREATININE 1.40* 1.12 1.30* 1.34*  CALCIUM 9.0 8.1* 8.5* 8.9  MG 1.8  --   --   --   PHOS 2.0*  --   --   --    GFR: Estimated Creatinine Clearance: 55.2 mL/min (A) (by C-G formula based on SCr of  1.34 mg/dL (H)). Liver Function Tests: Recent Labs  Lab 09/24/20 1134 09/25/20 0417  AST 31 22  ALT 24 18  ALKPHOS 118 92  BILITOT 0.9 0.8  PROT 6.7 5.5*  ALBUMIN 3.4* 2.8*   No results for input(s): LIPASE, AMYLASE in the last 168 hours. No results for input(s): AMMONIA in the last 168 hours. Coagulation Profile: Recent Labs  Lab 09/24/20 1134  INR 1.3*   Cardiac Enzymes: No results for input(s): CKTOTAL, CKMB, CKMBINDEX, TROPONINI in the last 168 hours. BNP (last 3 results) No results for input(s): PROBNP in the last 8760 hours. HbA1C: No results for input(s): HGBA1C in the last 72 hours. CBG: Recent Labs  Lab 09/26/20 0935 09/26/20 1212 09/26/20 1606 09/26/20 2149 09/27/20 0803  GLUCAP 153* 120* 108* 112* 95   Lipid Profile: No results for input(s): CHOL, HDL, LDLCALC, TRIG, CHOLHDL, LDLDIRECT in the last 72 hours. Thyroid Function Tests: No results for input(s): TSH, T4TOTAL, FREET4, T3FREE, THYROIDAB in the last 72 hours. Anemia Panel: No results for input(s): VITAMINB12, FOLATE, FERRITIN, TIBC, IRON, RETICCTPCT in the last 72 hours. Sepsis Labs: Recent Labs  Lab 09/24/20 1134 09/24/20 1259 09/25/20 0417 09/26/20 0311  PROCALCITON <0.10  --  <0.10 <0.10  LATICACIDVEN 2.7* 1.4  --   --     Recent Results (from the past 240 hour(s))  Resp Panel by RT-PCR (Flu A&B, Covid) Nasopharyngeal Swab     Status: None   Collection Time: 09/17/20 10:43 PM   Specimen: Nasopharyngeal Swab; Nasopharyngeal(NP) swabs in vial transport medium  Result Value Ref Range Status   SARS Coronavirus 2 by RT PCR NEGATIVE NEGATIVE Final    Comment: (NOTE) SARS-CoV-2 target nucleic acids are NOT DETECTED.  The SARS-CoV-2 RNA is generally detectable in upper respiratory specimens during the acute phase of infection. The lowest concentration of SARS-CoV-2 viral copies this assay can detect is 138 copies/mL. A negative result does not preclude SARS-Cov-2 infection and should not  be used as the sole basis for treatment or other patient management decisions. A negative result may occur with  improper specimen collection/handling, submission of specimen other than nasopharyngeal swab, presence of viral mutation(s) within the areas targeted by this assay, and inadequate number of viral copies(<138 copies/mL). A negative result must be combined with clinical observations, patient history, and epidemiological information. The expected result is Negative.  Fact Sheet for Patients:  BloggerCourse.comhttps://www.fda.gov/media/152166/download  Fact Sheet for Healthcare Providers:  SeriousBroker.ithttps://www.fda.gov/media/152162/download  This test is no t yet approved or cleared by the [place] FDA and  has been authorized for detection  and/or diagnosis of SARS-CoV-2 by FDA under an Emergency Use Authorization (EUA). This EUA will remain  in effect (meaning this test can be used) for the duration of the COVID-19 declaration under Section 564(b)(1) of the Act, 21 U.S.C.section 360bbb-3(b)(1), unless the authorization is terminated  or revoked sooner.       Influenza A by PCR NEGATIVE NEGATIVE Final   Influenza B by PCR NEGATIVE NEGATIVE Final    Comment: (NOTE) The Xpert Xpress SARS-CoV-2/FLU/RSV plus assay is intended as an aid in the diagnosis of influenza from Nasopharyngeal swab specimens and should not be used as a sole basis for treatment. Nasal washings and aspirates are unacceptable for Xpert Xpress SARS-CoV-2/FLU/RSV testing.  Fact Sheet for Patients: BloggerCourse.com  Fact Sheet for Healthcare Providers: SeriousBroker.it  This test is not yet approved or cleared by the [place] FDA and has been authorized for detection and/or diagnosis of SARS-CoV-2 by FDA under an Emergency Use Authorization (EUA). This EUA will remain in effect (meaning this test can be used) for the duration of the COVID-19 declaration under Section  564(b)(1) of the Act, 21 U.S.C. section 360bbb-3(b)(1), unless the authorization is terminated or revoked.  Performed at [facility], [address] [place], [place] [address]   Blood Culture (routine x 2)     Status: None (Preliminary result)   Collection Time: 09/24/20 11:34 AM   Specimen: BLOOD  Result Value Ref Range Status   Specimen Description BLOOD BLOOD LEFT FOREARM  Final   Special Requests   Final    BOTTLES DRAWN AEROBIC AND ANAEROBIC Blood Culture adequate volume   Culture   Final    NO GROWTH 3 DAYS Performed at [facility], [address], [place], [place] [address]    Report Status PENDING  Incomplete  Blood Culture (routine x 2)     Status: None (Preliminary result)   Collection Time: 09/24/20 11:34 AM   Specimen: BLOOD  Result Value Ref Range Status   Specimen Description BLOOD LEFT ANTECUBITAL  Final   Special Requests   Final    BOTTLES DRAWN AEROBIC AND ANAEROBIC Blood Culture adequate volume   Culture   Final    NO GROWTH 3 DAYS Performed at [facility], [address] [place], [place] [address]    Report Status PENDING  Incomplete  Urine culture     Status: Abnormal   Collection Time: 09/24/20 11:34 AM   Specimen: Urine, Random  Result Value Ref Range Status   Specimen Description   Final    URINE, RANDOM Performed at [facility], [address], [place], [place] [address]    Special Requests   Final    NONE Performed at [facility], [address] [place], [place] [address]    Culture (A)  Final    <10,000 COLONIES/mL INSIGNIFICANT GROWTH Performed at [facility], [address] [place], [place] [address]    Report Status 09/26/2020 FINAL  Final  Resp Panel by RT-PCR (Flu A&B, Covid) Nasopharyngeal Swab     Status: None   Collection Time: 09/24/20 11:34 AM   Specimen: Nasopharyngeal Swab; Nasopharyngeal(NP) swabs in vial transport medium  Result Value Ref Range Status   SARS  Coronavirus 2 by RT PCR NEGATIVE NEGATIVE Final    Comment: (NOTE) SARS-CoV-2 target nucleic acids are NOT DETECTED.  The SARS-CoV-2 RNA is generally detectable in upper respiratory specimens during the acute phase of infection. The lowest concentration of SARS-CoV-2 viral copies this  assay can detect is 138 copies/mL. A negative result does not preclude SARS-Cov-2 infection and should not be used as the sole basis for treatment or other patient management decisions. A negative result may occur with  improper specimen collection/handling, submission of specimen other than nasopharyngeal swab, presence of viral mutation(s) within the areas targeted by this assay, and inadequate number of viral copies(<138 copies/mL). A negative result must be combined with clinical observations, patient history, and epidemiological information. The expected result is Negative.  Fact Sheet for Patients:  BloggerCourse.com  Fact Sheet for Healthcare Providers:  SeriousBroker.it  This test is no t yet approved or cleared by the [place] FDA and  has been authorized for detection and/or diagnosis of SARS-CoV-2 by FDA under an Emergency Use Authorization (EUA). This EUA will remain  in effect (meaning this test can be used) for the duration of the COVID-19 declaration under Section 564(b)(1) of the Act, 21 U.S.C.section 360bbb-3(b)(1), unless the authorization is terminated  or revoked sooner.       Influenza A by PCR NEGATIVE NEGATIVE Final   Influenza B by PCR NEGATIVE NEGATIVE Final    Comment: (NOTE) The Xpert Xpress SARS-CoV-2/FLU/RSV plus assay is intended as an aid in the diagnosis of influenza from Nasopharyngeal swab specimens and should not be used as a sole basis for treatment. Nasal washings and aspirates are unacceptable for Xpert Xpress SARS-CoV-2/FLU/RSV testing.  Fact Sheet for  Patients: BloggerCourse.com  Fact Sheet for Healthcare Providers: SeriousBroker.it  This test is not yet approved or cleared by the [place] FDA and has been authorized for detection and/or diagnosis of SARS-CoV-2 by FDA under an Emergency Use Authorization (EUA). This EUA will remain in effect (meaning this test can be used) for the duration of the COVID-19 declaration under Section 564(b)(1) of the Act, 21 U.S.C. section 360bbb-3(b)(1), unless the authorization is terminated or revoked.  Performed at [facility], [address], [place], [place] [address]          Radiology Studies: No results found.      Scheduled Meds: . atorvastatin  80 mg Oral Daily  . cholecalciferol  5,000 Units Oral Daily  . enalapril  2.5 mg Oral Daily  . insulin aspart  0-15 Units Subcutaneous TID WC  . pantoprazole  40 mg Oral Daily   Continuous Infusions:    LOS: 2 days    Time spent: 30 mins    Charise Killian, MD [facility] Pager [phone]  If 7PM-7AM, please contact night-coverage 09/27/2020, 8:14 AM

## 2020-09-27 NOTE — Progress Notes (Signed)
Patient Name: Alexander MeadowRaymond E Heidinger Jr. Date of Encounter: 09/27/2020  Hospital Problem List     Principal Problem:   Bradycardia, unspecified Active Problems:   Primary hypertension   Hyperlipidemia   Pre-diabetes   Hypokalemia   Mild protein malnutrition (HCC)   Grade I diastolic dysfunction   Bradycardia    Patient Profile     79 year old male with hypertension, hyperlipidemia, history of TIA with history of bradycardia which has been symptomatic.  Had been evaluated for consideration for a pacemaker as an outpatient but this was deferred.  He now returns with frequent episodes of near syncope.  Noted to be relatively bradycardic with PACs and PVCs.  No sustained ventricular or supraventricular arrhythmia.  Subjective   Still somewhat weak and fatigued.  Plan permanent pacemaker tomorrow  Inpatient Medications    . atorvastatin  80 mg Oral Daily  . cholecalciferol  5,000 Units Oral Daily  . enalapril  2.5 mg Oral Daily  . insulin aspart  0-15 Units Subcutaneous TID WC  . pantoprazole  40 mg Oral Daily    Vital Signs    Vitals:   09/26/20 2353 09/27/20 0554 09/27/20 0853 09/27/20 1111  BP: 131/72 132/85 126/72 136/62  Pulse: (!) 53 (!) 59 (!) 40 63  Resp:  18 20 16   Temp: 97.7 F (36.5 C) 98.5 F (36.9 C) 98 F (36.7 C) (!) 97.4 F (36.3 C)  TempSrc: Oral Oral Oral Oral  SpO2: 95% 96% 96% 99%  Weight:      Height:        Intake/Output Summary (Last 24 hours) at 09/27/2020 1343 Last data filed at 09/26/2020 1900 Gross per 24 hour  Intake 240 ml  Output 500 ml  Net -260 ml   Filed Weights   09/24/20 1130  Weight: 109 kg    Physical Exam    GEN: Well nourished, well developed, in no acute distress.  HEENT: normal.  Neck: Supple, no JVD, carotid bruits, or masses. Cardiac: Irregular rhythm. Respiratory:  Respirations regular and unlabored, clear to auscultation bilaterally. GI: Soft, nontender, nondistended, BS + x 4. MS: no deformity or  atrophy. Skin: warm and dry, no rash. Neuro:  Strength and sensation are intact. Psych: Normal affect.  Labs    CBC Recent Labs    09/25/20 0417 09/25/20 0417 09/26/20 0311 09/27/20 0314  WBC 7.9   < > 8.1 7.3  NEUTROABS 5.4  --   --   --   HGB 11.1*   < > 12.5* 12.8*  HCT 31.9*   < > 35.9* 38.2*  MCV 89.4   < > 89.1 92.3  PLT 157   < > 180 191   < > = values in this interval not displayed.   Basic Metabolic Panel Recent Labs    16/07/9610/28/21 0311 09/27/20 0314  NA 137 142  K 3.7 3.8  CL 107 108  CO2 23 25  GLUCOSE 99 103*  BUN 16 18  CREATININE 1.30* 1.34*  CALCIUM 8.5* 8.9   Liver Function Tests Recent Labs    09/25/20 0417  AST 22  ALT 18  ALKPHOS 92  BILITOT 0.8  PROT 5.5*  ALBUMIN 2.8*   No results for input(s): LIPASE, AMYLASE in the last 72 hours. Cardiac Enzymes No results for input(s): CKTOTAL, CKMB, CKMBINDEX, TROPONINI in the last 72 hours. BNP No results for input(s): BNP in the last 72 hours. D-Dimer No results for input(s): DDIMER in the last 72 hours. Hemoglobin A1C No  results for input(s): HGBA1C in the last 72 hours. Fasting Lipid Panel No results for input(s): CHOL, HDL, LDLCALC, TRIG, CHOLHDL, LDLDIRECT in the last 72 hours. Thyroid Function Tests No results for input(s): TSH, T4TOTAL, T3FREE, THYROIDAB in the last 72 hours.  Invalid input(s): FREET3  Telemetry    Sinus rhythm/sinus bradycardia with PVCs and PACs.  ECG    Sinus rhythm with frequent PACs and PVCs with bigeminy.  Radiology    CT Head Wo Contrast  Result Date: 09/24/2020 CLINICAL DATA:  Head trauma, minor. Additional provided: Patient reports weakness for 2 days, altered mental status per family. Recent fall, taking blood thinners, history of prior stroke EXAM: CT HEAD WITHOUT CONTRAST TECHNIQUE: Contiguous axial images were obtained from the base of the skull through the vertex without intravenous contrast. COMPARISON:  Brain MRI 09/17/2020.  Head CT 09/17/2020.  FINDINGS: Brain: Mild-to-moderate generalized cerebral atrophy. Mild ill-defined hypoattenuation within the cerebral white matter is nonspecific, but compatible with chronic small vessel ischemic disease. There is no acute intracranial hemorrhage. No demarcated cortical infarct. No extra-axial fluid collection. No evidence of intracranial mass. No midline shift. Vascular: No hyperdense vessel. Skull: Normal. Negative for fracture or focal lesion. Sinuses/Orbits: Visualized orbits show no acute finding. Mild ethmoid sinus mucosal thickening. IMPRESSION: No evidence of acute intracranial abnormality. Cerebral atrophy and chronic small vessel ischemic disease, stable. Mild ethmoid sinus mucosal thickening. Electronically Signed   By: Jackey Loge DO   On: 09/24/2020 12:13   CT Head Wo Contrast  Result Date: 09/17/2020 CLINICAL DATA:  Neck trauma.  Syncope. EXAM: CT HEAD WITHOUT CONTRAST CT CERVICAL SPINE WITHOUT CONTRAST TECHNIQUE: Multidetector CT imaging of the head and cervical spine was performed following the standard protocol without intravenous contrast. Multiplanar CT image reconstructions of the cervical spine were also generated. COMPARISON:  Head CT May 31, 2018 FINDINGS: CT HEAD FINDINGS Brain: No subdural, epidural, or subarachnoid hemorrhage. Cerebellum, brainstem, and basal cisterns are normal. Ventricles and sulci are prominent but stable. Mild white matter changes are stable. No acute cortical ischemia or infarct. No mass effect or midline shift. Vascular: No hyperdense vessel or unexpected calcification. Skull: Normal. Negative for fracture or focal lesion. Sinuses/Orbits: No acute finding. Other: None. CT CERVICAL SPINE FINDINGS Alignment: Normal. Skull base and vertebrae: No acute fracture. No primary bone lesion or focal pathologic process. Soft tissues and spinal canal: No prevertebral fluid or swelling. No visible canal hematoma. Disc levels:  Multilevel degenerative changes. Upper chest:  Negative. Other: No other abnormalities IMPRESSION: 1. No acute intracranial abnormalities. 2. No fracture or traumatic malalignment in the cervical spine. Significant multilevel degenerative disc disease. Electronically Signed   By: Gerome Sam III M.D   On: 09/17/2020 20:49   CT Cervical Spine Wo Contrast  Result Date: 09/17/2020 CLINICAL DATA:  Neck trauma.  Syncope. EXAM: CT HEAD WITHOUT CONTRAST CT CERVICAL SPINE WITHOUT CONTRAST TECHNIQUE: Multidetector CT imaging of the head and cervical spine was performed following the standard protocol without intravenous contrast. Multiplanar CT image reconstructions of the cervical spine were also generated. COMPARISON:  Head CT May 31, 2018 FINDINGS: CT HEAD FINDINGS Brain: No subdural, epidural, or subarachnoid hemorrhage. Cerebellum, brainstem, and basal cisterns are normal. Ventricles and sulci are prominent but stable. Mild white matter changes are stable. No acute cortical ischemia or infarct. No mass effect or midline shift. Vascular: No hyperdense vessel or unexpected calcification. Skull: Normal. Negative for fracture or focal lesion. Sinuses/Orbits: No acute finding. Other: None. CT CERVICAL SPINE FINDINGS Alignment: Normal.  Skull base and vertebrae: No acute fracture. No primary bone lesion or focal pathologic process. Soft tissues and spinal canal: No prevertebral fluid or swelling. No visible canal hematoma. Disc levels:  Multilevel degenerative changes. Upper chest: Negative. Other: No other abnormalities IMPRESSION: 1. No acute intracranial abnormalities. 2. No fracture or traumatic malalignment in the cervical spine. Significant multilevel degenerative disc disease. Electronically Signed   By: Gerome Sam III M.D   On: 09/17/2020 20:49   MR BRAIN WO CONTRAST  Result Date: 09/18/2020 CLINICAL DATA:  Initial evaluation for acute TIA, confusion. EXAM: MRI HEAD WITHOUT CONTRAST TECHNIQUE: Multiplanar, multiecho pulse sequences of the brain  and surrounding structures were obtained without intravenous contrast. COMPARISON:  Prior head CT from 09/17/2020 as well as previous brain MRI from 05/18/2019 FINDINGS: Brain: Generalized age appropriate cerebral atrophy. Mild chronic microvascular ischemic disease present within the periventricular and deep white matter both cerebral hemispheres. No abnormal foci of restricted diffusion to suggest acute or subacute ischemia. Punctate focus of diffusion signal noted at the left periatrial white matter favored to be artifactual (series 5, image 22). Gray-white matter differentiation maintained. No encephalomalacia to suggest chronic cortical infarction. No acute intracranial hemorrhage. Single chronic microhemorrhage noted at the left frontal corona radiata, likely small vessel related. No mass lesion, midline shift or mass effect. No hydrocephalus or extra-axial fluid collection. Pituitary gland suprasellar region normal. Midline structures intact. Vascular: Major intracranial vascular flow voids are maintained. Skull and upper cervical spine: Craniocervical junction normal. Bone marrow signal intensity within normal limits. No scalp soft tissue abnormality. Sinuses/Orbits: Patient status post bilateral ocular lens replacement. Globes and orbital soft tissues demonstrate no acute finding. Paranasal sinuses are clear. No mastoid effusion. Inner ear structures grossly normal. Other: None. IMPRESSION: 1. No acute intracranial abnormality. 2. Mild chronic microvascular ischemic disease for age. Electronically Signed   By: Rise Mu M.D.   On: 09/18/2020 00:35   US Carotid Bilateral (at [facility] and [facility] only)  Result Date: 09/18/2020 CLINICAL DATA:  TIA symptoms, hypertension, hyperlipidemia and diabetes EXAM: BILATERAL CAROTID DUPLEX ULTRASOUND TECHNIQUE: Wallace Cullens scale imaging, color Doppler and duplex ultrasound were performed of bilateral carotid and vertebral arteries in the neck. COMPARISON:  05/31/2018  FINDINGS: Criteria: Quantification of carotid stenosis is based on velocity parameters that correlate the residual internal carotid diameter with NASCET-based stenosis levels, using the diameter of the distal internal carotid lumen as the denominator for stenosis measurement. The following velocity measurements were obtained: RIGHT ICA: 63/14 cm/sec CCA: 33/5 cm/sec SYSTOLIC ICA/CCA RATIO:  1.9 ECA: 73 cm/sec LEFT ICA: 79/24 cm/sec CCA: 52/17 cm/sec SYSTOLIC ICA/CCA RATIO:  1.5 ECA: 90 cm/sec RIGHT CAROTID ARTERY: Minor intimal thickening and plaque formation. No hemodynamically significant right ICA stenosis, velocity elevation, or turbulent flow. Degree of narrowing less than 50%. RIGHT VERTEBRAL ARTERY:  Normal antegrade flow LEFT CAROTID ARTERY: Similar scattered minor intimal thickening and plaque formation. No hemodynamically significant left ICA stenosis, velocity elevation, or turbulent flow. LEFT VERTEBRAL ARTERY:  Normal antegrade IMPRESSION: Minor carotid atherosclerosis. No hemodynamically significant ICA stenosis. Degree of narrowing less than 50% bilaterally by ultrasound criteria. Patent antegrade vertebral flow bilaterally Electronically Signed   By: Judie Petit.  Shick M.D.   On: 09/18/2020 09:05   DG Chest Port 1 View  Result Date: 09/24/2020 CLINICAL DATA:  Questionable sepsis. Weakness, altered mental status. EXAM: PORTABLE CHEST 1 VIEW COMPARISON:  09/17/2020 FINDINGS: Postoperative changes noted in the left scapula and ribs. Heart is borderline in size. Probable lingular scarring. No acute confluent  airspace opacities or effusions. No acute bony abnormality. IMPRESSION: No active disease. Electronically Signed   By: Charlett Nose M.D.   On: 09/24/2020 12:03   DG Chest Portable 1 View  Result Date: 09/17/2020 CLINICAL DATA:  78 year old male with syncope. EXAM: PORTABLE CHEST 1 VIEW COMPARISON:  Chest radiograph dated 05/30/2018. FINDINGS: Left lung base streaky densities, likely atelectasis. No  focal consolidation, pleural effusion, pneumothorax. The cardiac silhouette is within limits. No acute osseous pathology. Left scapular fixation hardware. IMPRESSION: No active disease. Electronically Signed   By: Elgie Collard M.D.   On: 09/17/2020 20:56   ECHOCARDIOGRAM COMPLETE  Result Date: 09/20/2020    ECHOCARDIOGRAM REPORT   Patient Name:   Kee Drudge. Date of Exam: 09/18/2020 Medical Rec #:  [id number]          Height:       71.0 in Accession #:    [id number]         Weight:       210.0 lb Date of Birth:  04/18/1941           BSA:          2.153 m Patient Age:    79 years           BP:           125/81 mmHg Patient Gender: M                  HR:           92 bpm. Exam Location:  [facility] Procedure: 2D Echo, Cardiac Doppler and Color Doppler Indications:     TIA 435.9  History:         Patient has prior history of Echocardiogram examinations, most                  recent 07/26/2018. Stroke; Risk Factors:Hypertension and                  Dyslipidemia.  Sonographer:     Cristela Blue RDCS (AE) Referring Phys:  [id number] Vernetta Honey MANSY Diagnosing Phys: Adrian Blackwater MD  Sonographer Comments: Suboptimal parasternal window. IMPRESSIONS  1. Left ventricular ejection fraction, by estimation, is 55 to 60%. The left ventricle has normal function. The left ventricle has no regional wall motion abnormalities. There is moderate left ventricular hypertrophy. Left ventricular diastolic parameters are consistent with Grade I diastolic dysfunction (impaired relaxation).  2. Right ventricular systolic function is mildly reduced. The right ventricular size is moderately enlarged.  3. Left atrial size was moderately dilated.  4. Right atrial size was moderately dilated.  5. The pericardial effusion is circumferential.  6. The mitral valve is normal in structure. Mild mitral valve regurgitation. No evidence of mitral stenosis.  7. The aortic valve is normal in structure. Aortic valve regurgitation is not visualized. Mild aortic  valve sclerosis is present, with no evidence of aortic valve stenosis.  8. The inferior vena cava is normal in size with greater than 50% respiratory variability, suggesting right atrial pressure of 3 mmHg. Conclusion(s)/Recommendation(s): No intracardiac source of embolism detected on this transthoracic study. A transesophageal echocardiogram is recommended to exclude cardiac source of embolism if clinically indicated. FINDINGS  Left Ventricle: Left ventricular ejection fraction, by estimation, is 55 to 60%. The left ventricle has normal function. The left ventricle has no regional wall motion abnormalities. The left ventricular internal cavity size was normal in size. There is  moderate left ventricular hypertrophy. Left ventricular  diastolic parameters are consistent with Grade I diastolic dysfunction (impaired relaxation). Right Ventricle: The right ventricular size is moderately enlarged. No increase in right ventricular wall thickness. Right ventricular systolic function is mildly reduced. Left Atrium: Left atrial size was moderately dilated. Right Atrium: Right atrial size was moderately dilated. Pericardium: Trivial pericardial effusion is present. The pericardial effusion is circumferential. Mitral Valve: The mitral valve is normal in structure. Mild mitral valve regurgitation. No evidence of mitral valve stenosis. Tricuspid Valve: The tricuspid valve is normal in structure. Tricuspid valve regurgitation is mild . No evidence of tricuspid stenosis. Aortic Valve: The aortic valve is normal in structure. Aortic valve regurgitation is not visualized. Mild aortic valve sclerosis is present, with no evidence of aortic valve stenosis. Aortic valve mean gradient measures 5.7 mmHg. Aortic valve peak gradient measures 10.4 mmHg. Aortic valve area, by VTI measures 1.63 cm. Pulmonic Valve: The pulmonic valve was normal in structure. Pulmonic valve regurgitation is trivial. No evidence of pulmonic stenosis. Aorta: The  aortic root is normal in size and structure. Venous: The inferior vena cava is normal in size with greater than 50% respiratory variability, suggesting right atrial pressure of 3 mmHg. IAS/Shunts: No atrial level shunt detected by color flow Doppler.  LEFT VENTRICLE PLAX 2D LVIDd:         4.46 cm  Diastology LVIDs:         3.18 cm  LV e' medial:    4.57 cm/s LV PW:         1.08 cm  LV E/e' medial:  14.6 LV IVS:        0.99 cm  LV e' lateral:   9.14 cm/s LVOT diam:     2.00 cm  LV E/e' lateral: 7.3 LV SV:         58 LV SV Index:   27 LVOT Area:     3.14 cm  RIGHT VENTRICLE RV Basal diam:  3.63 cm RV S prime:     9.79 cm/s TAPSE (M-mode): 2.4 cm LEFT ATRIUM             Index       RIGHT ATRIUM           Index LA diam:        3.60 cm 1.67 cm/m  RA Area:     26.30 cm LA Vol (A2C):   53.0 ml 24.60 ml/m RA Volume:   87.60 ml  40.69 ml/m LA Vol (A4C):   69.7 ml 32.38 ml/m LA Biplane Vol: 72.8 ml 33.82 ml/m  AORTIC VALVE                    PULMONIC VALVE AV Area (Vmax):    1.57 cm     RVOT Peak grad: 2 mmHg AV Area (Vmean):   1.54 cm AV Area (VTI):     1.63 cm AV Vmax:           161.33 cm/s AV Vmean:          108.000 cm/s AV VTI:            0.355 m AV Peak Grad:      10.4 mmHg AV Mean Grad:      5.7 mmHg LVOT Vmax:         80.50 cm/s LVOT Vmean:        52.800 cm/s LVOT VTI:          0.184 m LVOT/AV VTI ratio: 0.52  AORTA Ao Root  diam: 2.60 cm MITRAL VALVE               TRICUSPID VALVE MV Area (PHT): 2.87 cm    TR Peak grad:   35.8 mmHg MV Decel Time: 264 msec    TR Vmax:        299.00 cm/s MV E velocity: 66.90 cm/s MV A velocity: 75.70 cm/s  SHUNTS MV E/A ratio:  0.88        Systemic VTI:  0.18 m                            Systemic Diam: 2.00 cm Adrian Blackwater MD Electronically signed by Adrian Blackwater MD Signature Date/Time: 09/20/2020/12:45:18 PM    Final     Assessment & Plan    Patient has symptomatic bradycardia.  Had been on Eliquis for anticoagulation due to concern over paroxysmal A. fib.  This has been  held.  Has symptomatic bradycardia.  Plan dual-chamber permanent pacemaker for backup tomorrow with further recommendations after this complete.  Would reason Eliquis after pacemaker is in place.  Signed, Darlin Priestly Allena Pietila MD 09/27/2020, 1:43 PM  Pager: [phone]

## 2020-09-27 NOTE — Progress Notes (Signed)
SUBJECTIVE: Patient resting comfortably in bed.  Denies chest pain or shortness of breath.  Denies any dizziness.   Vitals:   09/26/20 1900 09/26/20 2353 09/27/20 0554 09/27/20 0853  BP: 130/72 131/72 132/85 126/72  Pulse: 61 (!) 53 (!) 59 (!) 40  Resp: 18  18 20   Temp: 98.3 F (36.8 C) 97.7 F (36.5 C) 98.5 F (36.9 C) 98 F (36.7 C)  TempSrc: Oral Oral Oral Oral  SpO2: 95% 95% 96% 96%  Weight:      Height:        Intake/Output Summary (Last 24 hours) at 09/27/2020 1024 Last data filed at 09/26/2020 1900 Gross per 24 hour  Intake 480 ml  Output 500 ml  Net -20 ml    LABS: Basic Metabolic Panel: Recent Labs    09/24/20 1134 09/25/20 0417 09/26/20 0311 09/27/20 0314  NA 136   < > 137 142  K 3.4*   < > 3.7 3.8  CL 102   < > 107 108  CO2 24   < > 23 25  GLUCOSE 207*   < > 99 103*  BUN 15   < > 16 18  CREATININE 1.40*   < > 1.30* 1.34*  CALCIUM 9.0   < > 8.5* 8.9  MG 1.8  --   --   --   PHOS 2.0*  --   --   --    < > = values in this interval not displayed.   Liver Function Tests: Recent Labs    09/24/20 1134 09/25/20 0417  AST 31 22  ALT 24 18  ALKPHOS 118 92  BILITOT 0.9 0.8  PROT 6.7 5.5*  ALBUMIN 3.4* 2.8*   No results for input(s): LIPASE, AMYLASE in the last 72 hours. CBC: Recent Labs    09/24/20 1134 09/24/20 1134 09/25/20 0417 09/25/20 0417 09/26/20 0311 09/27/20 0314  WBC 8.6   < > 7.9   < > 8.1 7.3  NEUTROABS 7.0  --  5.4  --   --   --   HGB 13.1   < > 11.1*   < > 12.5* 12.8*  HCT 38.6*   < > 31.9*   < > 35.9* 38.2*  MCV 90.6   < > 89.4   < > 89.1 92.3  PLT 206   < > 157   < > 180 191   < > = values in this interval not displayed.   Cardiac Enzymes: No results for input(s): CKTOTAL, CKMB, CKMBINDEX, TROPONINI in the last 72 hours. BNP: Invalid input(s): POCBNP D-Dimer: No results for input(s): DDIMER in the last 72 hours. Hemoglobin A1C: No results for input(s): HGBA1C in the last 72 hours. Fasting Lipid Panel: No results for  input(s): CHOL, HDL, LDLCALC, TRIG, CHOLHDL, LDLDIRECT in the last 72 hours. Thyroid Function Tests: No results for input(s): TSH, T4TOTAL, T3FREE, THYROIDAB in the last 72 hours.  Invalid input(s): FREET3 Anemia Panel: No results for input(s): VITAMINB12, FOLATE, FERRITIN, TIBC, IRON, RETICCTPCT in the last 72 hours.   PHYSICAL EXAM General: Well developed, well nourished, in no acute distress HEENT:  Normocephalic and atramatic Neck:  No JVD.  Lungs: Clear bilaterally to auscultation and percussion. Heart: Regularly irregular Abdomen: Bowel sounds are positive, abdomen soft and non-tender  Msk:  Back normal, normal gait. Normal strength and tone for age. Extremities: No clubbing, cyanosis or edema.   Neuro: Alert and oriented X 3. Psych:  Good affect, responds appropriately  TELEMETRY: Ventricular bigeminy.  Already/BPM  ASSESSMENT AND PLAN: Patient presented to the emergency department with bradycardia and presyncope.  Permanent pacemaker placement planned for 11/30.  Patient is currently stable. We will continue to follow.  Principal Problem:   Bradycardia, unspecified Active Problems:   Primary hypertension   Hyperlipidemia   Pre-diabetes   Hypokalemia   Mild protein malnutrition (HCC)   Grade I diastolic dysfunction   Bradycardia    Maryelizabeth Kaufmann, NP-C 09/27/2020 10:24 AM

## 2020-09-28 ENCOUNTER — Inpatient Hospital Stay: Payer: Medicare Other

## 2020-09-28 ENCOUNTER — Encounter: Payer: Self-pay | Admitting: Cardiology

## 2020-09-28 ENCOUNTER — Encounter
Admission: EM | Disposition: A | Discharge status: Home / Self Care | Payer: Self-pay | Source: Home / Self Care | Attending: Internal Medicine

## 2020-09-28 DIAGNOSIS — R7303 Prediabetes: Secondary | ICD-10-CM | POA: Diagnosis not present

## 2020-09-28 DIAGNOSIS — R001 Bradycardia, unspecified: Secondary | ICD-10-CM | POA: Diagnosis not present

## 2020-09-28 DIAGNOSIS — I10 Essential (primary) hypertension: Secondary | ICD-10-CM | POA: Diagnosis not present

## 2020-09-28 HISTORY — PX: PACEMAKER IMPLANT: EP1218

## 2020-09-28 LAB — CBC
HCT: 34 % — ABNORMAL LOW (ref 39.0–52.0)
Hemoglobin: 12 g/dL — ABNORMAL LOW (ref 13.0–17.0)
MCH: 31.3 pg (ref 26.0–34.0)
MCHC: 35.3 g/dL (ref 30.0–36.0)
MCV: 88.5 fL (ref 80.0–100.0)
Platelets: 196 10*3/uL (ref 150–400)
RBC: 3.84 MIL/uL — ABNORMAL LOW (ref 4.22–5.81)
RDW: 13.4 % (ref 11.5–15.5)
WBC: 7.1 10*3/uL (ref 4.0–10.5)
nRBC: 0 % (ref 0.0–0.2)

## 2020-09-28 LAB — BASIC METABOLIC PANEL
Anion gap: 10 (ref 5–15)
BUN: 19 mg/dL (ref 8–23)
CO2: 24 mmol/L (ref 22–32)
Calcium: 8.8 mg/dL — ABNORMAL LOW (ref 8.9–10.3)
Chloride: 106 mmol/L (ref 98–111)
Creatinine, Ser: 1.22 mg/dL (ref 0.61–1.24)
GFR, Estimated: 60 mL/min — ABNORMAL LOW (ref >60)
Glucose, Bld: 97 mg/dL (ref 70–99)
Potassium: 3.5 mmol/L (ref 3.5–5.1)
Sodium: 140 mmol/L (ref 135–145)

## 2020-09-28 LAB — GLUCOSE, CAPILLARY
Glucose-Capillary: 94 mg/dL (ref 70–99)
Glucose-Capillary: 91 mg/dL (ref 70–99)
Glucose-Capillary: 94 mg/dL (ref 70–99)
Glucose-Capillary: 93 mg/dL (ref 70–99)
Glucose-Capillary: 107 mg/dL — ABNORMAL HIGH (ref 70–99)

## 2020-09-28 SURGERY — PACEMAKER IMPLANT
Anesthesia: Choice

## 2020-09-28 MED ORDER — SODIUM CHLORIDE 0.9 % IV SOLN
80.0000 mg | INTRAVENOUS | Status: AC
  Administered 2020-09-28: 80 mg
  Filled 2020-09-28 (×2): qty 2

## 2020-09-28 MED ORDER — FENTANYL CITRATE (PF) 100 MCG/2ML IJ SOLN
INTRAMUSCULAR | Status: AC
  Filled 2020-09-28: qty 2

## 2020-09-28 MED ORDER — SODIUM CHLORIDE 0.9 % IV SOLN
INTRAVENOUS | Status: AC | PRN
  Administered 2020-09-28: 60 mL via INTRAVENOUS

## 2020-09-28 MED ORDER — SODIUM CHLORIDE 0.9 % IV SOLN: INTRAVENOUS | Status: DC

## 2020-09-28 MED ORDER — LIDOCAINE HCL (PF) 1 % IJ SOLN
INTRAMUSCULAR | Status: DC | PRN
  Administered 2020-09-28: 10 mL

## 2020-09-28 MED ORDER — HYDRALAZINE HCL 20 MG/ML IJ SOLN
INTRAMUSCULAR | Status: AC
  Filled 2020-09-28: qty 1

## 2020-09-28 MED ORDER — METOPROLOL TARTRATE 5 MG/5ML IV SOLN
INTRAVENOUS | Status: AC
  Filled 2020-09-28: qty 5

## 2020-09-28 MED ORDER — MIDAZOLAM HCL 2 MG/2ML IJ SOLN
INTRAMUSCULAR | Status: AC
  Filled 2020-09-28: qty 2

## 2020-09-28 MED ORDER — MIDAZOLAM HCL 2 MG/2ML IJ SOLN
INTRAMUSCULAR | Status: DC | PRN
  Administered 2020-09-28 (×2): 1 mg via INTRAVENOUS

## 2020-09-28 MED ORDER — METOPROLOL TARTRATE 5 MG/5ML IV SOLN
INTRAVENOUS | Status: DC | PRN
  Administered 2020-09-28: 5 mg via INTRAVENOUS

## 2020-09-28 MED ORDER — ONDANSETRON HCL 4 MG/2ML IJ SOLN: 4.0000 mg | Freq: Four times a day (QID) | INTRAMUSCULAR | Status: DC | PRN

## 2020-09-28 MED ORDER — FENTANYL CITRATE (PF) 100 MCG/2ML IJ SOLN
INTRAMUSCULAR | Status: DC | PRN
  Administered 2020-09-28 (×3): 25 ug via INTRAVENOUS

## 2020-09-28 MED ORDER — ACETAMINOPHEN 325 MG PO TABS
325.0000 mg | ORAL_TABLET | ORAL | Status: DC | PRN
  Filled 2020-09-28: qty 2

## 2020-09-28 MED ORDER — CEFAZOLIN SODIUM-DEXTROSE 1-4 GM/50ML-% IV SOLN
1.0000 g | Freq: Four times a day (QID) | INTRAVENOUS | Status: AC
  Administered 2020-09-28 – 2020-09-29 (×3): 1 g via INTRAVENOUS
  Filled 2020-09-28 (×4): qty 50

## 2020-09-28 MED ORDER — LIDOCAINE HCL (PF) 1 % IJ SOLN
INTRAMUSCULAR | Status: AC
  Filled 2020-09-28: qty 30

## 2020-09-28 MED ORDER — METOPROLOL SUCCINATE ER 50 MG PO TB24
50.0000 mg | ORAL_TABLET | Freq: Every day | ORAL | Status: DC
  Administered 2020-09-28 – 2020-09-29 (×2): 50 mg via ORAL
  Filled 2020-09-28: qty 1

## 2020-09-28 MED ORDER — CEFAZOLIN SODIUM-DEXTROSE 2-4 GM/100ML-% IV SOLN
INTRAVENOUS | Status: AC
  Filled 2020-09-28: qty 100

## 2020-09-28 MED ORDER — CEFAZOLIN SODIUM-DEXTROSE 2-4 GM/100ML-% IV SOLN
2.0000 g | INTRAVENOUS | Status: AC
  Administered 2020-09-28: 2 g via INTRAVENOUS
  Filled 2020-09-28: qty 100

## 2020-09-28 SURGICAL SUPPLY — 14 items
CABLE SURG 12 DISP A/V CHANNEL (MISCELLANEOUS) ×3 IMPLANT
DEVICE DSSCT PLSMBLD 3.0S LGHT (MISCELLANEOUS) ×1 IMPLANT
INTRO PACEMAKR LEAD 9FR 13CM (INTRODUCER) ×3
INTRO PACEMKR SHEATH II 7FR (MISCELLANEOUS) ×3
INTRODUCER PACEMKR LD 9FR 13CM (INTRODUCER) ×1 IMPLANT
INTRODUCER PACEMKR SHTH II 7FR (MISCELLANEOUS) ×1 IMPLANT
IPG PACE AZUR XT DR MRI W1DR01 (Pacemaker) ×1 IMPLANT
LEAD CAPSURE NOVUS 5076-52CM (Lead) ×3 IMPLANT
LEAD CAPSURE NOVUS 5076-58CM (Lead) ×3 IMPLANT
PACE AZURE XT DR MRI W1DR01 (Pacemaker) ×3 IMPLANT
PACK PACE INSERTION (MISCELLANEOUS) ×3 IMPLANT
PAD ELECT DEFIB RADIOL ZOLL (MISCELLANEOUS) ×3 IMPLANT
PLASMABLADE 3.0S W/LIGHT (MISCELLANEOUS) ×3
SLING ARM IMMOBILIZER LRG (SOFTGOODS) ×3 IMPLANT

## 2020-09-28 NOTE — Progress Notes (Signed)
Cooper Render, on-call Provider, notified via secure chat, that CCMD called for Bijeminy PVCs; patient for Pacemaker placement this am; patient asleep. Ouma, NP,Acknowledged. Windy Carina, RN 3:35 AM 09/28/2020

## 2020-09-28 NOTE — TOC Initial Note (Signed)
Transition of Care (TOC) - Initial/Assessment Note    Patient Details  Name: Alexander Kline. MRN: [id number] Date of Birth: 05/18/1941  Transition of Care [facility]) CM/SW Contact:    Chapman Fitch, RN Phone Number: 09/28/2020, 2:52 PM  Clinical Narrative:                   Patient admitted with bradycardia.  Pacemaker placement today Wife at bedside.  Patient states that he lives home alone.  Wife lives in a separate home.    PCP [employer].  [facility] - denies issues obtaining medications  Patient states that he is independent.  RW, cane at home but states he has not needed them  PT has seen patient and recommends home health PT.  Patient declines at this time.  I left him my name and number should he change his mind.  MD notified  Expected Discharge Plan: Home/Self Care Barriers to Discharge: Continued Medical Work up   Patient Goals and CMS Choice        Expected Discharge Plan and Services Expected Discharge Plan: Home/Self Care       Living arrangements for the past 2 months: Single Family Home                                      Prior Living Arrangements/Services Living arrangements for the past 2 months: Single Family Home Lives with:: Self   Do you feel safe going back to the place where you live?: Yes      Need for Family Participation in Patient Care: Yes (Comment) Care giver support system in place?: Yes (comment) Current home services: DME Criminal Activity/Legal Involvement Pertinent to Current Situation/Hospitalization: No - Comment as needed  Activities of Daily Living Home Assistive Devices/Equipment: Eyeglasses ADL Screening (condition at time of admission) Patient's cognitive ability adequate to safely complete daily activities?: No Is the patient deaf or have difficulty hearing?: No Does the patient have difficulty seeing, even when wearing glasses/contacts?: No Does the patient have difficulty concentrating, remembering, or  making decisions?: Yes Patient able to express need for assistance with ADLs?: No Does the patient have difficulty dressing or bathing?: Yes Independently performs ADLs?: No Communication: Independent Dressing (OT): Needs assistance Is this a change from baseline?: Change from baseline, expected to last <3days Grooming: Needs assistance Is this a change from baseline?: Change from baseline, expected to last <3 days Feeding: Independent Bathing: Needs assistance Is this a change from baseline?: Change from baseline, expected to last <3 days Toileting: Dependent Is this a change from baseline?: Change from baseline, expected to last <3 days In/Out Bed: Needs assistance Is this a change from baseline?: Change from baseline, expected to last <3 days Walks in Home: Needs assistance Is this a change from baseline?: Change from baseline, expected to last <3 days Does the patient have difficulty walking or climbing stairs?: Yes Weakness of Legs: Both Weakness of Arms/Hands: None  Permission Sought/Granted                  Emotional Assessment       Orientation: : Oriented to Self, Oriented to Place, Oriented to  Time, Oriented to Situation      Admission diagnosis:  Weakness [R53.1] Fever [R50.9] Fever, unspecified fever cause [R50.9] Bradycardia [R00.1] Patient Active Problem List   Diagnosis Date Noted  . Bradycardia 09/25/2020  . Fever 09/24/2020  .  Bradycardia, unspecified 09/24/2020  . Pre-diabetes   . Hypokalemia   . Mild protein malnutrition (HCC)   . Grade I diastolic dysfunction   . Hyperlipidemia   . Altered mental status   . TIA (transient ischemic attack) 09/17/2020  . Abnormal SPEP 06/23/2020  . Primary hypertension 06/23/2020  . Sinus bradycardia 06/22/2020  . Premature ventricular contractions 06/22/2020  . Heart palpitations 06/22/2020  . Dizziness 06/22/2020  . 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 CVA (cerebral  vascular accident) (HCC) 05/30/2018  . Muscle strain of thigh 05/15/2018  . Trochanteric bursitis of right hip 02/14/2018  . Hip fracture, unspecified laterality, closed, initial encounter (HCC) 09/28/2017  . Hip fracture (HCC) 09/28/2017   PCP:  Sherron Monday, MD Pharmacy:   [facility] [address] [place] - [address] [place] [address] Phone: [phone] Fax: [phone]     Social Determinants of Health (SDOH) Interventions    Readmission Risk Interventions No flowsheet data found.

## 2020-09-28 NOTE — Progress Notes (Signed)
20mg  IV hydralazine administered for sbp >160   Effective 156/84

## 2020-09-28 NOTE — Progress Notes (Signed)
PROGRESS NOTE    Alexander Kline.  NKN:[id number] DOB: 02-Jun-1941 DOA: 09/24/2020 PCP: Sherron Monday, MD    Assessment & Plan:   Principal Problem:   Bradycardia, unspecified Active Problems:   Primary hypertension   Hyperlipidemia   Pre-diabetes   Hypokalemia   Mild protein malnutrition (HCC)   Grade I diastolic dysfunction   Bradycardia  Bradycardia: continue on tele. Will have permanent pacemaker placed 09/28/20 as per cardio   A. fib: likely PAF. CHADsVASc 8. Continue on eliquis. Continue on tele. 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 Pt already refused home health   Chronic diastolic CHF: evoluemic. Continue on home dose of enalapril. Monitor I/Os.   HTN: continue on home dose of enalapril   HLD: continue on statin   Pre-DM: continue on SSI w/ accuchecks Continue to hold home dose of metformin   Hypokalemia: WNL today. Will continue to monitor   Mild protein malnutrition: continue w/ nutritional supplements     DVT prophylaxis: eliquis Code Status: full  Family Communication: discussed pt's care w/ pt's family at bedside and answered their questions Disposition Plan: d/c back home as pt refused home health   Status is: Observation  The patient will require care spanning > 2 midnights and should be moved to inpatient because: Ongoing diagnostic testing needed not appropriate for outpatient work up, Unsafe d/c plan and IV treatments appropriate due to intensity of illness or inability to take PO, will have pacemaker placed tomorrow   Dispo: The patient is from: Home              Anticipated d/c is to: Home              Anticipated d/c date is: 1 or 2 days, when cleared by cardio               Patient currently is not medically stable to d/c.     Consultants:    Cardio    Procedures:    Antimicrobials:   Subjective: Pt c/o malaise   Objective: Vitals:   09/27/20 2331 09/28/20 0000 09/28/20 0509 09/28/20 0820  BP: 126/72 126/78 135/60 (!) 142/99  Pulse: (!) 58 (!) 58 61 94  Resp: 18 18 16 20   Temp: 97.6 F (36.4 C) 97.6 F (36.4 C) 98.1 F (36.7 C) 98.7 F (37.1 C)  TempSrc: Oral Oral Oral Oral  SpO2: 96% 97% 96% 93%  Weight:      Height:        Intake/Output Summary (Last 24 hours) at 09/28/2020 0830 Last data filed at 09/27/2020 2032 Gross per 24 hour  Intake 0 ml  Output 0 ml  Net 0 ml   Filed Weights   09/24/20 1130  Weight: 109 kg    Examination:  General exam: Appears calm & comfortable  Respiratory system: clear breath sounds b/l. No wheezes, rales Cardiovascular system:  bradycardia Gastrointestinal system: Abd is soft, obese, non-tender & hypoactive bowel sounds   Central nervous system: Alert and oriented. Moves all 4 extremities  Psychiatry: Judgement and insight appear normal. Normal mood and affect   Data Reviewed: I have personally reviewed following labs and imaging studies  CBC: Recent Labs  Lab 09/24/20 1134 09/25/20 0417 09/26/20 0311 09/27/20 0314 09/28/20 0604  WBC 8.6 7.9 8.1 7.3 7.1  NEUTROABS 7.0 5.4  --   --   --  HGB 13.1 11.1* 12.5* 12.8* 12.0*  HCT 38.6* 31.9* 35.9* 38.2* 34.0*  MCV 90.6 89.4 89.1 92.3 88.5  PLT 206 157 180 191 196   Basic Metabolic Panel: Recent Labs  Lab 09/24/20 1134 09/25/20 0417 09/26/20 0311 09/27/20 0314 09/28/20 0604  NA 136 139 137 142 140  K 3.4* 3.3* 3.7 3.8 3.5  CL 102 108 107 108 106  CO2 24 22 23 25 24   GLUCOSE 207* 103* 99 103* 97  BUN 15 14 16 18 19   CREATININE 1.40* 1.12 1.30* 1.34* 1.22  CALCIUM 9.0 8.1* 8.5* 8.9 8.8*  MG 1.8  --   --   --   --   PHOS 2.0*  --   --   --   --    GFR: Estimated Creatinine Clearance: 60.7 mL/min (by C-G formula based on SCr of 1.22 mg/dL). Liver Function Tests: Recent Labs  Lab  09/24/20 1134 09/25/20 0417  AST 31 22  ALT 24 18  ALKPHOS 118 92  BILITOT 0.9 0.8  PROT 6.7 5.5*  ALBUMIN 3.4* 2.8*   No results for input(s): LIPASE, AMYLASE in the last 168 hours. No results for input(s): AMMONIA in the last 168 hours. Coagulation Profile: Recent Labs  Lab 09/24/20 1134  INR 1.3*   Cardiac Enzymes: No results for input(s): CKTOTAL, CKMB, CKMBINDEX, TROPONINI in the last 168 hours. BNP (last 3 results) No results for input(s): PROBNP in the last 8760 hours. HbA1C: No results for input(s): HGBA1C in the last 72 hours. CBG: Recent Labs  Lab 09/27/20 0803 09/27/20 1143 09/27/20 1639 09/27/20 2136 09/28/20 0728  GLUCAP 95 110* 105* 85 94   Lipid Profile: No results for input(s): CHOL, HDL, LDLCALC, TRIG, CHOLHDL, LDLDIRECT in the last 72 hours. Thyroid Function Tests: No results for input(s): TSH, T4TOTAL, FREET4, T3FREE, THYROIDAB in the last 72 hours. Anemia Panel: No results for input(s): VITAMINB12, FOLATE, FERRITIN, TIBC, IRON, RETICCTPCT in the last 72 hours. Sepsis Labs: Recent Labs  Lab 09/24/20 1134 09/24/20 1259 09/25/20 0417 09/26/20 0311  PROCALCITON <0.10  --  <0.10 <0.10  LATICACIDVEN 2.7* 1.4  --   --     Recent Results (from the past 240 hour(s))  Blood Culture (routine x 2)     Status: None (Preliminary result)   Collection Time: 09/24/20 11:34 AM   Specimen: BLOOD  Result Value Ref Range Status   Specimen Description BLOOD BLOOD LEFT FOREARM  Final   Special Requests   Final    BOTTLES DRAWN AEROBIC AND ANAEROBIC Blood Culture adequate volume   Culture   Final    NO GROWTH 4 DAYS Performed at [facility], [address] [place], [place] [address]    Report Status PENDING  Incomplete  Blood Culture (routine x 2)     Status: None (Preliminary result)   Collection Time: 09/24/20 11:34 AM   Specimen: BLOOD  Result Value Ref Range Status   Specimen Description BLOOD LEFT ANTECUBITAL  Final   Special Requests    Final    BOTTLES DRAWN AEROBIC AND ANAEROBIC Blood Culture adequate volume   Culture   Final    NO GROWTH 4 DAYS Performed at [facility], [address] [place], [place] [address]    Report Status PENDING  Incomplete  Urine culture     Status: Abnormal   Collection Time: 09/24/20 11:34 AM   Specimen: Urine, Random  Result Value Ref Range Status   Specimen Description   Final  URINE, RANDOM Performed at [facility], [address], [place], [place] [address]    Special Requests   Final    NONE Performed at [facility], [address] [place], [place] [address]    Culture (A)  Final    <10,000 COLONIES/mL INSIGNIFICANT GROWTH Performed at [facility], [address], [place], [place] [address]    Report Status 09/26/2020 FINAL  Final  Resp Panel by RT-PCR (Flu A&B, Covid) Nasopharyngeal Swab     Status: None   Collection Time: 09/24/20 11:34 AM   Specimen: Nasopharyngeal Swab; Nasopharyngeal(NP) swabs in vial transport medium  Result Value Ref Range Status   SARS Coronavirus 2 by RT PCR NEGATIVE NEGATIVE Final    Comment: (NOTE) SARS-CoV-2 target nucleic acids are NOT DETECTED.  The SARS-CoV-2 RNA is generally detectable in upper respiratory specimens during the acute phase of infection. The lowest concentration of SARS-CoV-2 viral copies this assay can detect is 138 copies/mL. A negative result does not preclude SARS-Cov-2 infection and should not be used as the sole basis for treatment or other patient management decisions. A negative result may occur with  improper specimen collection/handling, submission of specimen other than nasopharyngeal swab, presence of viral mutation(s) within the areas targeted by this assay, and inadequate number of viral copies(<138 copies/mL). A negative result must be combined with clinical observations, patient history, and epidemiological information. The expected result is Negative.  Fact  Sheet for Patients:  BloggerCourse.com  Fact Sheet for Healthcare Providers:  SeriousBroker.it  This test is no t yet approved or cleared by the [place] FDA and  has been authorized for detection and/or diagnosis of SARS-CoV-2 by FDA under an Emergency Use Authorization (EUA). This EUA will remain  in effect (meaning this test can be used) for the duration of the COVID-19 declaration under Section 564(b)(1) of the Act, 21 U.S.C.section 360bbb-3(b)(1), unless the authorization is terminated  or revoked sooner.       Influenza A by PCR NEGATIVE NEGATIVE Final   Influenza B by PCR NEGATIVE NEGATIVE Final    Comment: (NOTE) The Xpert Xpress SARS-CoV-2/FLU/RSV plus assay is intended as an aid in the diagnosis of influenza from Nasopharyngeal swab specimens and should not be used as a sole basis for treatment. Nasal washings and aspirates are unacceptable for Xpert Xpress SARS-CoV-2/FLU/RSV testing.  Fact Sheet for Patients: BloggerCourse.com  Fact Sheet for Healthcare Providers: SeriousBroker.it  This test is not yet approved or cleared by the [place] FDA and has been authorized for detection and/or diagnosis of SARS-CoV-2 by FDA under an Emergency Use Authorization (EUA). This EUA will remain in effect (meaning this test can be used) for the duration of the COVID-19 declaration under Section 564(b)(1) of the Act, 21 U.S.C. section 360bbb-3(b)(1), unless the authorization is terminated or revoked.  Performed at [facility], [address] [place], [place] [address]   Surgical PCR screen     Status: None   Collection Time: 09/27/20  9:17 AM   Specimen: Nasal Mucosa; Nasal Swab  Result Value Ref Range Status   MRSA, PCR NEGATIVE NEGATIVE Final   Staphylococcus aureus NEGATIVE NEGATIVE Final    Comment: (NOTE) The Xpert SA Assay (FDA approved for NASAL  specimens in patients 9 years of age and older), is one component of a comprehensive surveillance program. It is not intended to diagnose infection nor to guide or monitor treatment. Performed at [facility], [address] [place], [place] [address]  Radiology Studies: No results found.      Scheduled Meds:  atorvastatin  80 mg Oral Daily   cholecalciferol  5,000 Units Oral Daily   enalapril  2.5 mg Oral Daily   insulin aspart  0-15 Units Subcutaneous TID WC   pantoprazole  40 mg Oral Daily   Continuous Infusions:  sodium chloride       LOS: 3 days    Time spent: 31 mins    Charise Killian, MD [facility] Pager [phone]  If 7PM-7AM, please contact night-coverage 09/28/2020, 8:30 AM

## 2020-09-28 NOTE — Progress Notes (Signed)
PT Cancellation Note  Patient Details Name: Alexander Kline. MRN: [id number] DOB: [birth year]   Cancelled Treatment:    Reason Eval/Treat Not Completed: Patient at procedure or test/unavailable Pt out of room for pacemaker placement, will maintain on schedule and attempt to see tomorrow.  Malachi Pro, DPT 09/28/2020, 1:45 PM

## 2020-09-28 NOTE — Progress Notes (Signed)
Patient arrived from room 230, awake/alert x4   NPOI since midnight. Verbalizes understanding of procedure, daughter: [name] at bedside. Vitals stable, afebrile. New PIV placed #20g RAC, LFA PIV removed, painful with flush.  Lungs CTA

## 2020-09-28 NOTE — Progress Notes (Signed)
SUBJECTIVE: Patient getting permanent pacemaker today   Vitals:   09/27/20 2331 09/28/20 0000 09/28/20 0509 09/28/20 0820  BP: 126/72 126/78 135/60 (!) 142/99  Pulse: (!) 58 (!) 58 61 94  Resp: 18 18 16 20   Temp: 97.6 F (36.4 C) 97.6 F (36.4 C) 98.1 F (36.7 C) 98.7 F (37.1 C)  TempSrc: Oral Oral Oral Oral  SpO2: 96% 97% 96% 93%  Weight:      Height:        Intake/Output Summary (Last 24 hours) at 09/28/2020 0854 Last data filed at 09/27/2020 2032 Gross per 24 hour  Intake 0 ml  Output 0 ml  Net 0 ml    LABS: Basic Metabolic Panel: Recent Labs    09/27/20 0314 09/28/20 0604  NA 142 140  K 3.8 3.5  CL 108 106  CO2 25 24  GLUCOSE 103* 97  BUN 18 19  CREATININE 1.34* 1.22  CALCIUM 8.9 8.8*   Liver Function Tests: No results for input(s): AST, ALT, ALKPHOS, BILITOT, PROT, ALBUMIN in the last 72 hours. No results for input(s): LIPASE, AMYLASE in the last 72 hours. CBC: Recent Labs    09/27/20 0314 09/28/20 0604  WBC 7.3 7.1  HGB 12.8* 12.0*  HCT 38.2* 34.0*  MCV 92.3 88.5  PLT 191 196   Cardiac Enzymes: No results for input(s): CKTOTAL, CKMB, CKMBINDEX, TROPONINI in the last 72 hours. BNP: Invalid input(s): POCBNP D-Dimer: No results for input(s): DDIMER in the last 72 hours. Hemoglobin A1C: No results for input(s): HGBA1C in the last 72 hours. Fasting Lipid Panel: No results for input(s): CHOL, HDL, LDLCALC, TRIG, CHOLHDL, LDLDIRECT in the last 72 hours. Thyroid Function Tests: No results for input(s): TSH, T4TOTAL, T3FREE, THYROIDAB in the last 72 hours.  Invalid input(s): FREET3 Anemia Panel: No results for input(s): VITAMINB12, FOLATE, FERRITIN, TIBC, IRON, RETICCTPCT in the last 72 hours.   PHYSICAL EXAM General: Well developed, well nourished, in no acute distress HEENT:  Normocephalic and atramatic Neck:  No JVD.  Lungs: Clear bilaterally to auscultation and percussion. Heart: HRRR . Normal S1 and S2 without gallops or murmurs.   Abdomen: Bowel sounds are positive, abdomen soft and non-tender  Msk:  Back normal, normal gait. Normal strength and tone for age. Extremities: No clubbing, cyanosis or edema.   Neuro: Alert and oriented X 3. 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  Principal Problem:   Bradycardia, unspecified Active Problems:   Primary hypertension   Hyperlipidemia   Pre-diabetes   Hypokalemia   Mild protein malnutrition (HCC)   Grade I diastolic dysfunction   Bradycardia    09/30/20 A, MD, [facility] 09/28/2020 8:54 AM    F2

## 2020-09-28 NOTE — Care Management Important Message (Signed)
Important Message  Patient Details  Name: Alexander Kline. MRN: [id number] Date of Birth: Jan 20, 1941   Medicare Important Message Given:  Yes     [name] 09/28/2020, 11:40 AM

## 2020-09-28 NOTE — Progress Notes (Signed)
Dr. Darrold Junker spoke to patient and patient's daughter regarding procedure. Per cards patient can be discharged tomorrow. Left chest wall dressing c/d/i medtronic dual  chamber placed: pacing artria  70-120, No c/o's at this time, tolerating sandwich/soda/water without event.

## 2020-09-29 DIAGNOSIS — E441 Mild protein-calorie malnutrition: Secondary | ICD-10-CM

## 2020-09-29 DIAGNOSIS — I5189 Other ill-defined heart diseases: Secondary | ICD-10-CM

## 2020-09-29 DIAGNOSIS — Z955 Presence of coronary angioplasty implant and graft: Secondary | ICD-10-CM

## 2020-09-29 LAB — CBC
HCT: 36.8 % — ABNORMAL LOW (ref 39.0–52.0)
Hemoglobin: 12.9 g/dL — ABNORMAL LOW (ref 13.0–17.0)
MCH: 31.5 pg (ref 26.0–34.0)
MCHC: 35.1 g/dL (ref 30.0–36.0)
MCV: 89.8 fL (ref 80.0–100.0)
Platelets: 188 10*3/uL (ref 150–400)
RBC: 4.1 MIL/uL — ABNORMAL LOW (ref 4.22–5.81)
RDW: 13.5 % (ref 11.5–15.5)
WBC: 8.2 10*3/uL (ref 4.0–10.5)
nRBC: 0 % (ref 0.0–0.2)

## 2020-09-29 LAB — CULTURE, BLOOD (ROUTINE X 2)
Culture: NO GROWTH
Culture: NO GROWTH
Special Requests: ADEQUATE
Special Requests: ADEQUATE

## 2020-09-29 LAB — BASIC METABOLIC PANEL
Anion gap: 10 (ref 5–15)
BUN: 17 mg/dL (ref 8–23)
CO2: 23 mmol/L (ref 22–32)
Calcium: 8.6 mg/dL — ABNORMAL LOW (ref 8.9–10.3)
Chloride: 106 mmol/L (ref 98–111)
Creatinine, Ser: 1.12 mg/dL (ref 0.61–1.24)
GFR, Estimated: >60 mL/min (ref >60)
Glucose, Bld: 102 mg/dL — ABNORMAL HIGH (ref 70–99)
Potassium: 3.4 mmol/L — ABNORMAL LOW (ref 3.5–5.1)
Sodium: 139 mmol/L (ref 135–145)

## 2020-09-29 LAB — GLUCOSE, CAPILLARY
Glucose-Capillary: 96 mg/dL (ref 70–99)
Glucose-Capillary: 95 mg/dL (ref 70–99)

## 2020-09-29 MED ORDER — APIXABAN 5 MG PO TABS
5.0000 mg | ORAL_TABLET | Freq: Two times a day (BID) | ORAL | Status: DC
  Administered 2020-09-29: 5 mg via ORAL
  Filled 2020-09-29: qty 1

## 2020-09-29 MED ORDER — TRAMADOL HCL 50 MG PO TABS: 50.0000 mg | ORAL_TABLET | Freq: Four times a day (QID) | ORAL | Status: DC | PRN

## 2020-09-29 MED ORDER — METOPROLOL SUCCINATE ER 50 MG PO TB24
50.0000 mg | ORAL_TABLET | Freq: Every day | ORAL | 2 refills | Status: DC
Start: 2020-09-30 — End: 2021-04-13

## 2020-09-29 NOTE — Discharge Summary (Signed)
Physician Discharge Summary  Alexander Kline. VEH:[id number] DOB: September 18, 1941 DOA: 09/24/2020  PCP: Sherron Monday, MD  Admit date: 09/24/2020 Discharge date: 09/29/2020  Admitted From: home Disposition:  home  Recommendations for Outpatient Follow-up:  1. Follow up with PCP in 1-2 weeks 2. Please obtain BMP/CBC in one week 3. Please follow up with cardiology  Home Health: [facility] PT recommended, pt declined Equipment/Devices: none   Discharge Condition: Stable CODE STATUS: Full Diet recommendation: Heart Healthy    Discharge Diagnoses: Principal Problem:   Bradycardia, unspecified Active Problems:   Primary hypertension   Hyperlipidemia   Pre-diabetes   Hypokalemia   Mild protein malnutrition (HCC)   Grade I diastolic dysfunction   Bradycardia    Summary of HPI and Hospital Course:  HPI on admission by Dr. Robb Matar: "Alexander Kline. is a 79 y.o. male with medical history significant of hyperlipidemia, hypertension, prediabetes, history of other nonhemorrhagic CVA, grade 1 diastolic dysfunction who was admitted overnight last week due to what was thought to be a TIA after imaging was negative for CVA versus symptomatic bradycardia causing brain hypoperfusion who today is coming to the emergency department due to weakness for the past 2 days associated with decreased mentation.  His daughter also states that he had a recent fall, but there was no LOC... "  Cardiology was consulted.  Monitored on telemetry and noted to have persistent symptomatic bradycardia.  Pacemaker was placed on 09/28/2020.  Apparently pacemaker had been previously recommended but was declined by insurance.    Started on Toprol-XL as well.  Continued on Eliquis for anticoagulation in setting of A-fib.    Presented with AKI superimposed on CKD IIIa with renal function improving.  BMP in follow up.  Patient was seen by primary cardiologist during admission after placement of pacemaker.  He has been  cleared for discharge with follow up in their clinic next week.      Discharge Instructions   Discharge Instructions    Call MD for:  extreme fatigue   Complete by: As directed    Call MD for:  persistant dizziness or light-headedness   Complete by: As directed    Call MD for:  persistant nausea and vomiting   Complete by: As directed    Call MD for:  redness, tenderness, or signs of infection (pain, swelling, redness, odor or green/yellow discharge around incision site)   Complete by: As directed    Call MD for:  severe uncontrolled pain   Complete by: As directed    Call MD for:  temperature >100.4   Complete by: As directed    Diet - low sodium heart healthy   Complete by: As directed    Increase activity slowly   Complete by: As directed      Allergies as of 09/29/2020      Reactions   Sulfa Antibiotics    Per pt, he gets bad headaches with sulfa drugs      Medication List    TAKE these medications   Antioxidant Formula Tabs Take 1 tablet by mouth every evening. Super Antioxidant   atorvastatin 80 MG tablet Commonly known as: LIPITOR Take 1 tablet (80 mg total) by mouth daily.   Cholecalciferol 125 MCG (5000 UT) Tabs Take 5,000 Units by mouth daily.   Clinpro 5000 1.1 % Pste Generic drug: Sodium Fluoride Place 1 application onto teeth in the morning and at bedtime.   Coconut Oil 1000 MG Caps Take 1,000 mg by mouth  every evening.   CoQ-10 100 MG Caps Take 1 capsule by mouth every evening.   D-MANNOSE PO Take 600 mg by mouth in the morning and at bedtime.   Eliquis 5 MG Tabs tablet Generic drug: apixaban Take 5 mg by mouth 2 (two) times daily.   enalapril 2.5 MG tablet Commonly known as: VASOTEC Take 2.5 mg by mouth daily.   fluticasone 50 MCG/ACT nasal spray Commonly known as: FLONASE Place 1-2 sprays into both nostrils daily as needed (allergies.).   IRON PO Take 1 tablet by mouth daily.   metFORMIN 500 MG 24 hr tablet Commonly known as:  GLUCOPHAGE-XR Take 500 mg by mouth daily.   metoprolol succinate 50 MG 24 hr tablet Commonly known as: TOPROL-XL Take 1 tablet (50 mg total) by mouth daily. Take with or immediately following a meal. Start taking on: September 30, 2020   multivitamin with minerals Tabs tablet Take 1 tablet by mouth every evening. Senior Multivitamin   OMEGA-3-6-9 PO Take 1 capsule by mouth every evening.   omeprazole 20 MG capsule Commonly known as: PRILOSEC Take 20 mg by mouth every evening.   OVER THE COUNTER MEDICATION Take 1 capsule by mouth daily. Lion's mane Supplement   PREVAGEN PO Take 1 tablet by mouth daily.   VITAMIN B COMPLEX PO Take 1 tablet by mouth daily.       Allergies  Allergen Reactions  . Sulfa Antibiotics     Per pt, he gets bad headaches with sulfa drugs    Consultations: Cardiology   Procedures/Studies: CT Head Wo Contrast  Result Date: 09/24/2020 CLINICAL DATA:  Head trauma, minor. Additional provided: Patient reports weakness for 2 days, altered mental status per family. Recent fall, taking blood thinners, history of prior stroke EXAM: CT HEAD WITHOUT CONTRAST TECHNIQUE: Contiguous axial images were obtained from the base of the skull through the vertex without intravenous contrast. COMPARISON:  Brain MRI 09/17/2020.  Head CT 09/17/2020. FINDINGS: Brain: Mild-to-moderate generalized cerebral atrophy. Mild ill-defined hypoattenuation within the cerebral white matter is nonspecific, but compatible with chronic small vessel ischemic disease. There is no acute intracranial hemorrhage. No demarcated cortical infarct. No extra-axial fluid collection. No evidence of intracranial mass. No midline shift. Vascular: No hyperdense vessel. Skull: Normal. Negative for fracture or focal lesion. Sinuses/Orbits: Visualized orbits show no acute finding. Mild ethmoid sinus mucosal thickening. IMPRESSION: No evidence of acute intracranial abnormality. Cerebral atrophy and chronic small  vessel ischemic disease, stable. Mild ethmoid sinus mucosal thickening. Electronically Signed   By: Jackey LogeKyle  Golden DO   On: 09/24/2020 12:13   CT Head Wo Contrast  Result Date: 09/17/2020 CLINICAL DATA:  Neck trauma.  Syncope. EXAM: CT HEAD WITHOUT CONTRAST CT CERVICAL SPINE WITHOUT CONTRAST TECHNIQUE: Multidetector CT imaging of the head and cervical spine was performed following the standard protocol without intravenous contrast. Multiplanar CT image reconstructions of the cervical spine were also generated. COMPARISON:  Head CT May 31, 2018 FINDINGS: CT HEAD FINDINGS Brain: No subdural, epidural, or subarachnoid hemorrhage. Cerebellum, brainstem, and basal cisterns are normal. Ventricles and sulci are prominent but stable. Mild white matter changes are stable. No acute cortical ischemia or infarct. No mass effect or midline shift. Vascular: No hyperdense vessel or unexpected calcification. Skull: Normal. Negative for fracture or focal lesion. Sinuses/Orbits: No acute finding. Other: None. CT CERVICAL SPINE FINDINGS Alignment: Normal. Skull base and vertebrae: No acute fracture. No primary bone lesion or focal pathologic process. Soft tissues and spinal canal: No prevertebral fluid or swelling. No  visible canal hematoma. Disc levels:  Multilevel degenerative changes. Upper chest: Negative. Other: No other abnormalities IMPRESSION: 1. No acute intracranial abnormalities. 2. No fracture or traumatic malalignment in the cervical spine. Significant multilevel degenerative disc disease. Electronically Signed   By: Gerome Sam III M.D   On: 09/17/2020 20:49   CT Cervical Spine Wo Contrast  Result Date: 09/17/2020 CLINICAL DATA:  Neck trauma.  Syncope. EXAM: CT HEAD WITHOUT CONTRAST CT CERVICAL SPINE WITHOUT CONTRAST TECHNIQUE: Multidetector CT imaging of the head and cervical spine was performed following the standard protocol without intravenous contrast. Multiplanar CT image reconstructions of the  cervical spine were also generated. COMPARISON:  Head CT May 31, 2018 FINDINGS: CT HEAD FINDINGS Brain: No subdural, epidural, or subarachnoid hemorrhage. Cerebellum, brainstem, and basal cisterns are normal. Ventricles and sulci are prominent but stable. Mild white matter changes are stable. No acute cortical ischemia or infarct. No mass effect or midline shift. Vascular: No hyperdense vessel or unexpected calcification. Skull: Normal. Negative for fracture or focal lesion. Sinuses/Orbits: No acute finding. Other: None. CT CERVICAL SPINE FINDINGS Alignment: Normal. Skull base and vertebrae: No acute fracture. No primary bone lesion or focal pathologic process. Soft tissues and spinal canal: No prevertebral fluid or swelling. No visible canal hematoma. Disc levels:  Multilevel degenerative changes. Upper chest: Negative. Other: No other abnormalities IMPRESSION: 1. No acute intracranial abnormalities. 2. No fracture or traumatic malalignment in the cervical spine. Significant multilevel degenerative disc disease. Electronically Signed   By: Gerome Sam III M.D   On: 09/17/2020 20:49   MR BRAIN WO CONTRAST  Result Date: 09/18/2020 CLINICAL DATA:  Initial evaluation for acute TIA, confusion. EXAM: MRI HEAD WITHOUT CONTRAST TECHNIQUE: Multiplanar, multiecho pulse sequences of the brain and surrounding structures were obtained without intravenous contrast. COMPARISON:  Prior head CT from 09/17/2020 as well as previous brain MRI from 05/18/2019 FINDINGS: Brain: Generalized age appropriate cerebral atrophy. Mild chronic microvascular ischemic disease present within the periventricular and deep white matter both cerebral hemispheres. No abnormal foci of restricted diffusion to suggest acute or subacute ischemia. Punctate focus of diffusion signal noted at the left periatrial white matter favored to be artifactual (series 5, image 22). Gray-white matter differentiation maintained. No encephalomalacia to suggest  chronic cortical infarction. No acute intracranial hemorrhage. Single chronic microhemorrhage noted at the left frontal corona radiata, likely small vessel related. No mass lesion, midline shift or mass effect. No hydrocephalus or extra-axial fluid collection. Pituitary gland suprasellar region normal. Midline structures intact. Vascular: Major intracranial vascular flow voids are maintained. Skull and upper cervical spine: Craniocervical junction normal. Bone marrow signal intensity within normal limits. No scalp soft tissue abnormality. Sinuses/Orbits: Patient status post bilateral ocular lens replacement. Globes and orbital soft tissues demonstrate no acute finding. Paranasal sinuses are clear. No mastoid effusion. Inner ear structures grossly normal. Other: None. IMPRESSION: 1. No acute intracranial abnormality. 2. Mild chronic microvascular ischemic disease for age. Electronically Signed   By: Rise Mu M.D.   On: 09/18/2020 00:35   US Carotid Bilateral (at [facility] and [facility] only)  Result Date: 09/18/2020 CLINICAL DATA:  TIA symptoms, hypertension, hyperlipidemia and diabetes EXAM: BILATERAL CAROTID DUPLEX ULTRASOUND TECHNIQUE: Wallace Cullens scale imaging, color Doppler and duplex ultrasound were performed of bilateral carotid and vertebral arteries in the neck. COMPARISON:  05/31/2018 FINDINGS: Criteria: Quantification of carotid stenosis is based on velocity parameters that correlate the residual internal carotid diameter with NASCET-based stenosis levels, using the diameter of the distal internal carotid lumen as the denominator  for stenosis measurement. The following velocity measurements were obtained: RIGHT ICA: 63/14 cm/sec CCA: 33/5 cm/sec SYSTOLIC ICA/CCA RATIO:  1.9 ECA: 73 cm/sec LEFT ICA: 79/24 cm/sec CCA: 52/17 cm/sec SYSTOLIC ICA/CCA RATIO:  1.5 ECA: 90 cm/sec RIGHT CAROTID ARTERY: Minor intimal thickening and plaque formation. No hemodynamically significant right ICA stenosis, velocity  elevation, or turbulent flow. Degree of narrowing less than 50%. RIGHT VERTEBRAL ARTERY:  Normal antegrade flow LEFT CAROTID ARTERY: Similar scattered minor intimal thickening and plaque formation. No hemodynamically significant left ICA stenosis, velocity elevation, or turbulent flow. LEFT VERTEBRAL ARTERY:  Normal antegrade IMPRESSION: Minor carotid atherosclerosis. No hemodynamically significant ICA stenosis. Degree of narrowing less than 50% bilaterally by ultrasound criteria. Patent antegrade vertebral flow bilaterally Electronically Signed   By: Judie Petit.  Shick M.D.   On: 09/18/2020 09:05   EP PPM/ICD IMPLANT  Result Date: 09/28/2020 Successful implantation Medtronic Azure XT DR MRI SureScan dual-chamber pacemaker  DG Chest Port 1 View  Result Date: 09/28/2020 CLINICAL DATA:  Cardiac pacemaker placement EXAM: PORTABLE CHEST 1 VIEW COMPARISON:  Radiograph 09/24/2020 FINDINGS: Interval placement of a left chest wall battery pack with leads positioned towards the right atrium and cardiac apex. Pacer leads appear well seated within the device without lead discontinuity. Slight prominence of the cardiac silhouette though may be partially attributable to low volumes and portable technique with diffuse atelectatic changes and vascular crowding. Some hazy interstitial opacity could reflect mild edematous changes well with some central vascular cuffing. No pneumothorax or effusion is seen however. No focal consolidation. Redemonstrated postsurgical changes from prior left scapular reconstruction. Degenerative changes are present in the imaged spine and shoulders. IMPRESSION: 1. Interval placement of a left chest wall battery pack with leads positioned towards the right atrium and cardiac apex. 2. No pneumothorax or effusion. 3. Hazy interstitial opacity with central vascular cuffing, could reflect mild edema and or atelectatic change with low volumes. Electronically Signed   By: Kreg Shropshire M.D.   On: 09/28/2020  16:33   DG Chest Port 1 View  Result Date: 09/24/2020 CLINICAL DATA:  Questionable sepsis. Weakness, altered mental status. EXAM: PORTABLE CHEST 1 VIEW COMPARISON:  09/17/2020 FINDINGS: Postoperative changes noted in the left scapula and ribs. Heart is borderline in size. Probable lingular scarring. No acute confluent airspace opacities or effusions. No acute bony abnormality. IMPRESSION: No active disease. Electronically Signed   By: Charlett Nose M.D.   On: 09/24/2020 12:03   DG Chest Portable 1 View  Result Date: 09/17/2020 CLINICAL DATA:  79 year old male with syncope. EXAM: PORTABLE CHEST 1 VIEW COMPARISON:  Chest radiograph dated 05/30/2018. FINDINGS: Left lung base streaky densities, likely atelectasis. No focal consolidation, pleural effusion, pneumothorax. The cardiac silhouette is within limits. No acute osseous pathology. Left scapular fixation hardware. IMPRESSION: No active disease. Electronically Signed   By: Elgie Collard M.D.   On: 09/17/2020 20:56   ECHOCARDIOGRAM COMPLETE  Result Date: 09/20/2020    ECHOCARDIOGRAM REPORT   Patient Name:   Rakin Lemelle. Date of Exam: 09/18/2020 Medical Rec #:  [id number]          Height:       71.0 in Accession #:    [id number]         Weight:       210.0 lb Date of Birth:  1941/05/27           BSA:          2.153 m Patient Age:    58 years  BP:           125/81 mmHg Patient Gender: M                  HR:           92 bpm. Exam Location:  [facility] Procedure: 2D Echo, Cardiac Doppler and Color Doppler Indications:     TIA 435.9  History:         Patient has prior history of Echocardiogram examinations, most                  recent 07/26/2018. Stroke; Risk Factors:Hypertension and                  Dyslipidemia.  Sonographer:     Cristela Blue RDCS (AE) Referring Phys:  [id number] Vernetta Honey MANSY Diagnosing Phys: Adrian Blackwater MD  Sonographer Comments: Suboptimal parasternal window. IMPRESSIONS  1. Left ventricular ejection fraction, by estimation, is 55  to 60%. The left ventricle has normal function. The left ventricle has no regional wall motion abnormalities. There is moderate left ventricular hypertrophy. Left ventricular diastolic parameters are consistent with Grade I diastolic dysfunction (impaired relaxation).  2. Right ventricular systolic function is mildly reduced. The right ventricular size is moderately enlarged.  3. Left atrial size was moderately dilated.  4. Right atrial size was moderately dilated.  5. The pericardial effusion is circumferential.  6. The mitral valve is normal in structure. Mild mitral valve regurgitation. No evidence of mitral stenosis.  7. The aortic valve is normal in structure. Aortic valve regurgitation is not visualized. Mild aortic valve sclerosis is present, with no evidence of aortic valve stenosis.  8. The inferior vena cava is normal in size with greater than 50% respiratory variability, suggesting right atrial pressure of 3 mmHg. Conclusion(s)/Recommendation(s): No intracardiac source of embolism detected on this transthoracic study. A transesophageal echocardiogram is recommended to exclude cardiac source of embolism if clinically indicated. FINDINGS  Left Ventricle: Left ventricular ejection fraction, by estimation, is 55 to 60%. The left ventricle has normal function. The left ventricle has no regional wall motion abnormalities. The left ventricular internal cavity size was normal in size. There is  moderate left ventricular hypertrophy. Left ventricular diastolic parameters are consistent with Grade I diastolic dysfunction (impaired relaxation). Right Ventricle: The right ventricular size is moderately enlarged. No increase in right ventricular wall thickness. Right ventricular systolic function is mildly reduced. Left Atrium: Left atrial size was moderately dilated. Right Atrium: Right atrial size was moderately dilated. Pericardium: Trivial pericardial effusion is present. The pericardial effusion is  circumferential. Mitral Valve: The mitral valve is normal in structure. Mild mitral valve regurgitation. No evidence of mitral valve stenosis. Tricuspid Valve: The tricuspid valve is normal in structure. Tricuspid valve regurgitation is mild . No evidence of tricuspid stenosis. Aortic Valve: The aortic valve is normal in structure. Aortic valve regurgitation is not visualized. Mild aortic valve sclerosis is present, with no evidence of aortic valve stenosis. Aortic valve mean gradient measures 5.7 mmHg. Aortic valve peak gradient measures 10.4 mmHg. Aortic valve area, by VTI measures 1.63 cm. Pulmonic Valve: The pulmonic valve was normal in structure. Pulmonic valve regurgitation is trivial. No evidence of pulmonic stenosis. Aorta: The aortic root is normal in size and structure. Venous: The inferior vena cava is normal in size with greater than 50% respiratory variability, suggesting right atrial pressure of 3 mmHg. IAS/Shunts: No atrial level shunt detected by color flow Doppler.  LEFT VENTRICLE PLAX 2D LVIDd:  4.46 cm  Diastology LVIDs:         3.18 cm  LV e' medial:    4.57 cm/s LV PW:         1.08 cm  LV E/e' medial:  14.6 LV IVS:        0.99 cm  LV e' lateral:   9.14 cm/s LVOT diam:     2.00 cm  LV E/e' lateral: 7.3 LV SV:         58 LV SV Index:   27 LVOT Area:     3.14 cm  RIGHT VENTRICLE RV Basal diam:  3.63 cm RV S prime:     9.79 cm/s TAPSE (M-mode): 2.4 cm LEFT ATRIUM             Index       RIGHT ATRIUM           Index LA diam:        3.60 cm 1.67 cm/m  RA Area:     26.30 cm LA Vol (A2C):   53.0 ml 24.60 ml/m RA Volume:   87.60 ml  40.69 ml/m LA Vol (A4C):   69.7 ml 32.38 ml/m LA Biplane Vol: 72.8 ml 33.82 ml/m  AORTIC VALVE                    PULMONIC VALVE AV Area (Vmax):    1.57 cm     RVOT Peak grad: 2 mmHg AV Area (Vmean):   1.54 cm AV Area (VTI):     1.63 cm AV Vmax:           161.33 cm/s AV Vmean:          108.000 cm/s AV VTI:            0.355 m AV Peak Grad:      10.4 mmHg AV  Mean Grad:      5.7 mmHg LVOT Vmax:         80.50 cm/s LVOT Vmean:        52.800 cm/s LVOT VTI:          0.184 m LVOT/AV VTI ratio: 0.52  AORTA Ao Root diam: 2.60 cm MITRAL VALVE               TRICUSPID VALVE MV Area (PHT): 2.87 cm    TR Peak grad:   35.8 mmHg MV Decel Time: 264 msec    TR Vmax:        299.00 cm/s MV E velocity: 66.90 cm/s MV A velocity: 75.70 cm/s  SHUNTS MV E/A ratio:  0.88        Systemic VTI:  0.18 m                            Systemic Diam: 2.00 cm Adrian Blackwater MD Electronically signed by Adrian Blackwater MD Signature Date/Time: 09/20/2020/12:45:18 PM    Final       Pacemaker placed 11/30   Subjective: Pt seen with daughter at bedside this AM.  He reports feeling well since PM placed yesterday.  No acute complaints.  Hopeful to go home.   Discharge Exam: Vitals:   09/29/20 0816 09/29/20 1156  BP: (!) 129/96 137/86  Pulse: 72 94  Resp: 14 16  Temp: 97.8 F (36.6 C) 98.3 F (36.8 C)  SpO2: 97% 96%   Vitals:   09/28/20 2307 09/29/20 0525 09/29/20 0816 09/29/20 1156  BP: (!) 142/82 Marland Kitchen)  150/84 (!) 129/96 137/86  Pulse: 89 90 72 94  Resp: Temp: 98.7 F (37.1 C) 98.4 F (36.9 C) 97.8 F (36.6 C) 98.3 F (36.8 C)  TempSrc: Oral Oral Oral Oral  SpO2: 96% 96% 97% 96%  Weight:      Height:        General: Pt is alert, awake, not in acute distress Cardiovascular: RRR, S1/S2 +, no rubs, no gallops Respiratory: CTA bilaterally, no wheezing, no rhonchi Abdominal: Soft, NT, ND, bowel sounds + Extremities: no edema, no cyanosis    The results of significant diagnostics from this hospitalization (including imaging, microbiology, ancillary and laboratory) are listed below for reference.     Microbiology: Recent Results (from the past 240 hour(s))  Blood Culture (routine x 2)     Status: None   Collection Time: 09/24/20 11:34 AM   Specimen: BLOOD  Result Value Ref Range Status   Specimen Description BLOOD BLOOD LEFT FOREARM  Final   Special Requests    Final    BOTTLES DRAWN AEROBIC AND ANAEROBIC Blood Culture adequate volume   Culture   Final    NO GROWTH 5 DAYS Performed at [facility], [address] [place], [place] [address]    Report Status 09/29/2020 FINAL  Final  Blood Culture (routine x 2)     Status: None   Collection Time: 09/24/20 11:34 AM   Specimen: BLOOD  Result Value Ref Range Status   Specimen Description BLOOD LEFT ANTECUBITAL  Final   Special Requests   Final    BOTTLES DRAWN AEROBIC AND ANAEROBIC Blood Culture adequate volume   Culture   Final    NO GROWTH 5 DAYS Performed at [facility], [address], [place], [place] [address]    Report Status 09/29/2020 FINAL  Final  Urine culture     Status: Abnormal   Collection Time: 09/24/20 11:34 AM   Specimen: Urine, Random  Result Value Ref Range Status   Specimen Description   Final    URINE, RANDOM Performed at [facility], [address], [place], [place] [address]    Special Requests   Final    NONE Performed at [facility], [address], [place], [place] [address]    Culture (A)  Final    <10,000 COLONIES/mL INSIGNIFICANT GROWTH Performed at [facility], [address] [place], [place] [address]    Report Status 09/26/2020 FINAL  Final  Resp Panel by RT-PCR (Flu A&B, Covid) Nasopharyngeal Swab     Status: None   Collection Time: 09/24/20 11:34 AM   Specimen: Nasopharyngeal Swab; Nasopharyngeal(NP) swabs in vial transport medium  Result Value Ref Range Status   SARS Coronavirus 2 by RT PCR NEGATIVE NEGATIVE Final    Comment: (NOTE) SARS-CoV-2 target nucleic acids are NOT DETECTED.  The SARS-CoV-2 RNA is generally detectable in upper respiratory specimens during the acute phase of infection. The lowest concentration of SARS-CoV-2 viral copies this assay can detect is 138 copies/mL. A negative result does not preclude SARS-Cov-2 infection and should not be used as the sole basis for treatment  or other patient management decisions. A negative result may occur with  improper specimen collection/handling, submission of specimen other than nasopharyngeal swab, presence of viral mutation(s) within the areas targeted by this assay, and inadequate number of viral copies(<138 copies/mL). A negative result must be combined with clinical observations, patient history, and epidemiological information. The expected result is Negative.  Fact Sheet for Patients:  BloggerCourse.com  Fact Sheet for Healthcare Providers:  SeriousBroker.it  This test is no t yet approved or cleared by the [place] FDA and  has been authorized for detection and/or diagnosis of SARS-CoV-2 by FDA under an Emergency Use Authorization (EUA). This EUA will remain  in effect (meaning this test can be used) for the duration of the COVID-19 declaration under Section 564(b)(1) of the Act, 21 U.S.C.section 360bbb-3(b)(1), unless the authorization is terminated  or revoked sooner.       Influenza A by PCR NEGATIVE NEGATIVE Final   Influenza B by PCR NEGATIVE NEGATIVE Final    Comment: (NOTE) The Xpert Xpress SARS-CoV-2/FLU/RSV plus assay is intended as an aid in the diagnosis of influenza from Nasopharyngeal swab specimens and should not be used as a sole basis for treatment. Nasal washings and aspirates are unacceptable for Xpert Xpress SARS-CoV-2/FLU/RSV testing.  Fact Sheet for Patients: BloggerCourse.com  Fact Sheet for Healthcare Providers: SeriousBroker.it  This test is not yet approved or cleared by the [place] FDA and has been authorized for detection and/or diagnosis of SARS-CoV-2 by FDA under an Emergency Use Authorization (EUA). This EUA will remain in effect (meaning this test can be used) for the duration of the COVID-19 declaration under Section 564(b)(1) of the Act, 21 U.S.C. section  360bbb-3(b)(1), unless the authorization is terminated or revoked.  Performed at [facility], [address] [place], [place] [address]   Surgical PCR screen     Status: None   Collection Time: 09/27/20  9:17 AM   Specimen: Nasal Mucosa; Nasal Swab  Result Value Ref Range Status   MRSA, PCR NEGATIVE NEGATIVE Final   Staphylococcus aureus NEGATIVE NEGATIVE Final    Comment: (NOTE) The Xpert SA Assay (FDA approved for NASAL specimens in patients 48 years of age and older), is one component of a comprehensive surveillance program. It is not intended to diagnose infection nor to guide or monitor treatment. Performed at [facility], [address] [place], [place] [address]      Labs: BNP (last 3 results) Recent Labs    09/17/20 1958  BNP 88.7   Basic Metabolic Panel: Recent Labs  Lab 09/24/20 1134 09/24/20 1134 09/25/20 0417 09/26/20 0311 09/27/20 0314 09/28/20 0604 09/29/20 0514  NA 136   < > 139 137 142 140 139  K 3.4*   < > 3.3* 3.7 3.8 3.5 3.4*  CL 102   < > 108 107 108 106 106  CO2 24   < > 22 23 25 24 23   GLUCOSE 207*   < > 103* 99 103* 97 102*  BUN 15   < > 14 16 18 19 17   CREATININE 1.40*   < > 1.12 1.30* 1.34* 1.22 1.12  CALCIUM 9.0   < > 8.1* 8.5* 8.9 8.8* 8.6*  MG 1.8  --   --   --   --   --   --   PHOS 2.0*  --   --   --   --   --   --    < > = values in this interval not displayed.   Liver Function Tests: Recent Labs  Lab 09/24/20 1134 09/25/20 0417  AST 31 22  ALT 24 18  ALKPHOS 118 92  BILITOT 0.9 0.8  PROT 6.7 5.5*  ALBUMIN 3.4* 2.8*   No results for input(s): LIPASE, AMYLASE in the last 168 hours. No results for input(s): AMMONIA in the  last 168 hours. CBC: Recent Labs  Lab 09/24/20 1134 09/24/20 1134 09/25/20 0417 09/26/20 0311 09/27/20 0314 09/28/20 0604 09/29/20 0514  WBC 8.6   < > 7.9 8.1 7.3 7.1 8.2  NEUTROABS 7.0  --  5.4  --   --   --   --   HGB 13.1   < > 11.1* 12.5* 12.8* 12.0* 12.9*  HCT 38.6*    < > 31.9* 35.9* 38.2* 34.0* 36.8*  MCV 90.6   < > 89.4 89.1 92.3 88.5 89.8  PLT 206   < > 157 180 191 196 188   < > = values in this interval not displayed.   Cardiac Enzymes: No results for input(s): CKTOTAL, CKMB, CKMBINDEX, TROPONINI in the last 168 hours. BNP: Invalid input(s): POCBNP CBG: Recent Labs  Lab 09/28/20 1216 09/28/20 1552 09/28/20 2033 09/29/20 0746 09/29/20 1210  GLUCAP 94 93 107* 96 95   D-Dimer No results for input(s): DDIMER in the last 72 hours. Hgb A1c No results for input(s): HGBA1C in the last 72 hours. Lipid Profile No results for input(s): CHOL, HDL, LDLCALC, TRIG, CHOLHDL, LDLDIRECT in the last 72 hours. Thyroid function studies No results for input(s): TSH, T4TOTAL, T3FREE, THYROIDAB in the last 72 hours.  Invalid input(s): FREET3 Anemia work up No results for input(s): VITAMINB12, FOLATE, FERRITIN, TIBC, IRON, RETICCTPCT in the last 72 hours. Urinalysis    Component Value Date/Time   COLORURINE AMBER (A) 09/24/2020 1134   APPEARANCEUR CLOUDY (A) 09/24/2020 1134   LABSPEC 1.015 09/24/2020 1134   PHURINE 5.0 09/24/2020 1134   GLUCOSEU NEGATIVE 09/24/2020 1134   HGBUR NEGATIVE 09/24/2020 1134   BILIRUBINUR NEGATIVE 09/24/2020 1134   KETONESUR NEGATIVE 09/24/2020 1134   PROTEINUR NEGATIVE 09/24/2020 1134   NITRITE NEGATIVE 09/24/2020 1134   LEUKOCYTESUR NEGATIVE 09/24/2020 1134   Sepsis Labs Invalid input(s): PROCALCITONIN,  WBC,  LACTICIDVEN Microbiology Recent Results (from the past 240 hour(s))  Blood Culture (routine x 2)     Status: None   Collection Time: 09/24/20 11:34 AM   Specimen: BLOOD  Result Value Ref Range Status   Specimen Description BLOOD BLOOD LEFT FOREARM  Final   Special Requests   Final    BOTTLES DRAWN AEROBIC AND ANAEROBIC Blood Culture adequate volume   Culture   Final    NO GROWTH 5 DAYS Performed at [facility], [address] [place], [place] [address]    Report Status 09/29/2020 FINAL   Final  Blood Culture (routine x 2)     Status: None   Collection Time: 09/24/20 11:34 AM   Specimen: BLOOD  Result Value Ref Range Status   Specimen Description BLOOD LEFT ANTECUBITAL  Final   Special Requests   Final    BOTTLES DRAWN AEROBIC AND ANAEROBIC Blood Culture adequate volume   Culture   Final    NO GROWTH 5 DAYS Performed at [facility], [address], [place], [place] [address]    Report Status 09/29/2020 FINAL  Final  Urine culture     Status: Abnormal   Collection Time: 09/24/20 11:34 AM   Specimen: Urine, Random  Result Value Ref Range Status   Specimen Description   Final    URINE, RANDOM Performed at [facility], [address], [place], [place] [address]    Special Requests   Final    NONE Performed at [facility], [address] [place], [place] [address]    Culture (A)  Final    <10,000  COLONIES/mL INSIGNIFICANT GROWTH Performed at [facility], [address], [place], [place] [address]    Report Status 09/26/2020 FINAL  Final  Resp Panel by RT-PCR (Flu A&B, Covid) Nasopharyngeal Swab     Status: None   Collection Time: 09/24/20 11:34 AM   Specimen: Nasopharyngeal Swab; Nasopharyngeal(NP) swabs in vial transport medium  Result Value Ref Range Status   SARS Coronavirus 2 by RT PCR NEGATIVE NEGATIVE Final    Comment: (NOTE) SARS-CoV-2 target nucleic acids are NOT DETECTED.  The SARS-CoV-2 RNA is generally detectable in upper respiratory specimens during the acute phase of infection. The lowest concentration of SARS-CoV-2 viral copies this assay can detect is 138 copies/mL. A negative result does not preclude SARS-Cov-2 infection and should not be used as the sole basis for treatment or other patient management decisions. A negative result may occur with  improper specimen collection/handling, submission of specimen other than nasopharyngeal swab, presence of viral mutation(s) within the areas targeted by this  assay, and inadequate number of viral copies(<138 copies/mL). A negative result must be combined with clinical observations, patient history, and epidemiological information. The expected result is Negative.  Fact Sheet for Patients:  BloggerCourse.com  Fact Sheet for Healthcare Providers:  SeriousBroker.it  This test is no t yet approved or cleared by the [place] FDA and  has been authorized for detection and/or diagnosis of SARS-CoV-2 by FDA under an Emergency Use Authorization (EUA). This EUA will remain  in effect (meaning this test can be used) for the duration of the COVID-19 declaration under Section 564(b)(1) of the Act, 21 U.S.C.section 360bbb-3(b)(1), unless the authorization is terminated  or revoked sooner.       Influenza A by PCR NEGATIVE NEGATIVE Final   Influenza B by PCR NEGATIVE NEGATIVE Final    Comment: (NOTE) The Xpert Xpress SARS-CoV-2/FLU/RSV plus assay is intended as an aid in the diagnosis of influenza from Nasopharyngeal swab specimens and should not be used as a sole basis for treatment. Nasal washings and aspirates are unacceptable for Xpert Xpress SARS-CoV-2/FLU/RSV testing.  Fact Sheet for Patients: BloggerCourse.com  Fact Sheet for Healthcare Providers: SeriousBroker.it  This test is not yet approved or cleared by the [place] FDA and has been authorized for detection and/or diagnosis of SARS-CoV-2 by FDA under an Emergency Use Authorization (EUA). This EUA will remain in effect (meaning this test can be used) for the duration of the COVID-19 declaration under Section 564(b)(1) of the Act, 21 U.S.C. section 360bbb-3(b)(1), unless the authorization is terminated or revoked.  Performed at [facility], [address] [place], [place] [address]   Surgical PCR screen     Status: None   Collection Time: 09/27/20  9:17 AM    Specimen: Nasal Mucosa; Nasal Swab  Result Value Ref Range Status   MRSA, PCR NEGATIVE NEGATIVE Final   Staphylococcus aureus NEGATIVE NEGATIVE Final    Comment: (NOTE) The Xpert SA Assay (FDA approved for NASAL specimens in patients 74 years of age and older), is one component of a comprehensive surveillance program. It is not intended to diagnose infection nor to guide or monitor treatment. Performed at [facility], [address] [place], [place] [address]      Time coordinating discharge: Over 30 minutes  SIGNED:   Pennie Banter, DO [facility] 09/29/2020, 12:21 PM   If 7PM-7AM, please contact night-coverage [email or web address]

## 2020-09-29 NOTE — Progress Notes (Signed)
SUBJECTIVE: Patient resting comfortably in bed.Denies chest pain or shortness of breath. States he feels fine after receiving his PPM yesterday.  Daughter at bedside, all questions answered.   Vitals:   09/28/20 2307 09/29/20 0525 09/29/20 0816 09/29/20 1156  BP: (!) 142/82 (!) 150/84 (!) 129/96 137/86  Pulse: 89 90 72 94  Resp: 16 16 14 16   Temp: 98.7 F (37.1 C) 98.4 F (36.9 C) 97.8 F (36.6 C) 98.3 F (36.8 C)  TempSrc: Oral Oral Oral Oral  SpO2: 96% 96% 97% 96%  Weight:      Height:        Intake/Output Summary (Last 24 hours) at 09/29/2020 1223 Last data filed at 09/29/2020 1058 Gross per 24 hour  Intake 441.24 ml  Output --  Net 441.24 ml    LABS: Basic Metabolic Panel: Recent Labs    09/28/20 0604 09/29/20 0514  NA 140 139  K 3.5 3.4*  CL 106 106  CO2 24 23  GLUCOSE 97 102*  BUN 19 17  CREATININE 1.22 1.12  CALCIUM 8.8* 8.6*   Liver Function Tests: No results for input(s): AST, ALT, ALKPHOS, BILITOT, PROT, ALBUMIN in the last 72 hours. No results for input(s): LIPASE, AMYLASE in the last 72 hours. CBC: Recent Labs    09/28/20 0604 09/29/20 0514  WBC 7.1 8.2  HGB 12.0* 12.9*  HCT 34.0* 36.8*  MCV 88.5 89.8  PLT 196 188   Cardiac Enzymes: No results for input(s): CKTOTAL, CKMB, CKMBINDEX, TROPONINI in the last 72 hours. BNP: Invalid input(s): POCBNP D-Dimer: No results for input(s): DDIMER in the last 72 hours. Hemoglobin A1C: No results for input(s): HGBA1C in the last 72 hours. Fasting Lipid Panel: No results for input(s): CHOL, HDL, LDLCALC, TRIG, CHOLHDL, LDLDIRECT in the last 72 hours. Thyroid Function Tests: No results for input(s): TSH, T4TOTAL, T3FREE, THYROIDAB in the last 72 hours.  Invalid input(s): FREET3 Anemia Panel: No results for input(s): VITAMINB12, FOLATE, FERRITIN, TIBC, IRON, RETICCTPCT in the last 72 hours.   PHYSICAL EXAM General: Well developed, well nourished, in no acute distress HEENT:  Normocephalic and  atramatic Neck:  No JVD.  Lungs: Clear bilaterally to auscultation and percussion. Heart: HRRR . Normal S1 and S2 without gallops or murmurs.  Abdomen: Bowel sounds are positive, abdomen soft and non-tender  Msk:  Back normal, normal gait. Normal strength and tone for age. Extremities: No clubbing, cyanosis or edema.   Neuro: Alert and oriented X 3. Psych:  Good affect, responds appropriately  TELEMETRY: NSR 76/bpm  ASSESSMENT AND PLAN: Patient presenting with bradycardia and presyncope requiring placement of PPM. Doing well overall. Bandage clean, dry, and intact with minimal drainage. Please continue metoprolol succinate 50mg  daily for now. Continue eliquis for now for stroke prevention and possible paroxysmal a fib. CHA2DS2-VASc score 5 vs HAS-BLED score of 2. Patient stable for discharge. Please ensure f/u at our office next week.  Principal Problem:   Bradycardia, unspecified Active Problems:   Primary hypertension   Hyperlipidemia   Pre-diabetes   Hypokalemia   Mild protein malnutrition (HCC)   Grade I diastolic dysfunction   Bradycardia    Chart reviewed, assessment, and order management time, 43 minutes  14/01/21, NP-C 09/29/2020 12:23 PM

## 2020-09-29 NOTE — Progress Notes (Signed)
D/C home order received.  RN reviewed all components of patient's AVS with patient and his wife including medications.  Patient has follow up with PCP 12/14 and made appointment with Cardiologist for 12/8 at 1500. Dr. Valentina Lucks aware. 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 Verbalized understanding of all instructions.  Contacted volunteer to pick up patient in wheelchair.  Wife will drive home.

## 2021-01-13 ENCOUNTER — Other Ambulatory Visit: Payer: Self-pay | Admitting: Internal Medicine

## 2021-01-13 DIAGNOSIS — R1310 Dysphagia, unspecified: Secondary | ICD-10-CM

## 2021-01-20 ENCOUNTER — Other Ambulatory Visit: Payer: Self-pay

## 2021-01-20 ENCOUNTER — Ambulatory Visit
Admission: RE | Admit: 2021-01-20 | Discharge: 2021-01-20 | Disposition: A | Discharge status: Home / Self Care | Payer: Medicare Other | Source: Ambulatory Visit | Attending: Internal Medicine | Admitting: Internal Medicine

## 2021-01-20 DIAGNOSIS — R1310 Dysphagia, unspecified: Secondary | ICD-10-CM | POA: Diagnosis not present

## 2021-03-10 ENCOUNTER — Other Ambulatory Visit (HOSPITAL_COMMUNITY): Payer: Self-pay | Admitting: Neurology

## 2021-03-10 DIAGNOSIS — G3184 Mild cognitive impairment, so stated: Secondary | ICD-10-CM

## 2021-03-10 DIAGNOSIS — M79651 Pain in right thigh: Secondary | ICD-10-CM

## 2021-04-05 ENCOUNTER — Other Ambulatory Visit
Admission: RE | Admit: 2021-04-05 | Discharge: 2021-04-05 | Disposition: A | Discharge status: Home / Self Care | Payer: Medicare Other | Source: Ambulatory Visit | Attending: Physician Assistant | Admitting: Physician Assistant

## 2021-04-05 DIAGNOSIS — R0600 Dyspnea, unspecified: Secondary | ICD-10-CM | POA: Insufficient documentation

## 2021-04-05 LAB — BRAIN NATRIURETIC PEPTIDE: B Natriuretic Peptide: 555.5 pg/mL — ABNORMAL HIGH (ref 0.0–100.0)

## 2021-04-07 ENCOUNTER — Ambulatory Visit (HOSPITAL_COMMUNITY): Payer: Medicare Other

## 2021-04-13 ENCOUNTER — Other Ambulatory Visit: Payer: Self-pay

## 2021-04-13 ENCOUNTER — Inpatient Hospital Stay
Admission: EM | Admit: 2021-04-13 | Discharge: 2021-04-16 | DRG: 871 | Disposition: A | Discharge status: Home / Self Care | Payer: Medicare Other | Attending: Internal Medicine | Admitting: Internal Medicine

## 2021-04-13 ENCOUNTER — Emergency Department: Payer: Medicare Other

## 2021-04-13 DIAGNOSIS — J189 Pneumonia, unspecified organism: Secondary | ICD-10-CM | POA: Diagnosis not present

## 2021-04-13 DIAGNOSIS — I4891 Unspecified atrial fibrillation: Secondary | ICD-10-CM | POA: Diagnosis present

## 2021-04-13 DIAGNOSIS — J9600 Acute respiratory failure, unspecified whether with hypoxia or hypercapnia: Secondary | ICD-10-CM | POA: Diagnosis present

## 2021-04-13 DIAGNOSIS — Z7984 Long term (current) use of oral hypoglycemic drugs: Secondary | ICD-10-CM

## 2021-04-13 DIAGNOSIS — Z882 Allergy status to sulfonamides status: Secondary | ICD-10-CM | POA: Diagnosis not present

## 2021-04-13 DIAGNOSIS — Z8673 Personal history of transient ischemic attack (TIA), and cerebral infarction without residual deficits: Secondary | ICD-10-CM | POA: Diagnosis not present

## 2021-04-13 DIAGNOSIS — Z96641 Presence of right artificial hip joint: Secondary | ICD-10-CM | POA: Diagnosis present

## 2021-04-13 DIAGNOSIS — N1832 Chronic kidney disease, stage 3b: Secondary | ICD-10-CM | POA: Diagnosis present

## 2021-04-13 DIAGNOSIS — I130 Hypertensive heart and chronic kidney disease with heart failure and stage 1 through stage 4 chronic kidney disease, or unspecified chronic kidney disease: Secondary | ICD-10-CM | POA: Diagnosis present

## 2021-04-13 DIAGNOSIS — Q210 Ventricular septal defect: Secondary | ICD-10-CM

## 2021-04-13 DIAGNOSIS — A419 Sepsis, unspecified organism: Principal | ICD-10-CM | POA: Diagnosis present

## 2021-04-13 DIAGNOSIS — Z79899 Other long term (current) drug therapy: Secondary | ICD-10-CM | POA: Diagnosis not present

## 2021-04-13 DIAGNOSIS — G9341 Metabolic encephalopathy: Secondary | ICD-10-CM | POA: Diagnosis not present

## 2021-04-13 DIAGNOSIS — J9601 Acute respiratory failure with hypoxia: Secondary | ICD-10-CM | POA: Diagnosis present

## 2021-04-13 DIAGNOSIS — R6520 Severe sepsis without septic shock: Secondary | ICD-10-CM | POA: Diagnosis not present

## 2021-04-13 DIAGNOSIS — Z8249 Family history of ischemic heart disease and other diseases of the circulatory system: Secondary | ICD-10-CM

## 2021-04-13 DIAGNOSIS — R000 Tachycardia, unspecified: Secondary | ICD-10-CM | POA: Diagnosis present

## 2021-04-13 DIAGNOSIS — Z7901 Long term (current) use of anticoagulants: Secondary | ICD-10-CM | POA: Diagnosis not present

## 2021-04-13 DIAGNOSIS — Z20822 Contact with and (suspected) exposure to covid-19: Secondary | ICD-10-CM | POA: Diagnosis present

## 2021-04-13 DIAGNOSIS — E785 Hyperlipidemia, unspecified: Secondary | ICD-10-CM | POA: Diagnosis present

## 2021-04-13 DIAGNOSIS — I5033 Acute on chronic diastolic (congestive) heart failure: Secondary | ICD-10-CM | POA: Diagnosis present

## 2021-04-13 DIAGNOSIS — I5032 Chronic diastolic (congestive) heart failure: Secondary | ICD-10-CM | POA: Diagnosis present

## 2021-04-13 DIAGNOSIS — N183 Chronic kidney disease, stage 3 unspecified: Secondary | ICD-10-CM | POA: Diagnosis present

## 2021-04-13 DIAGNOSIS — E1122 Type 2 diabetes mellitus with diabetic chronic kidney disease: Secondary | ICD-10-CM | POA: Diagnosis present

## 2021-04-13 DIAGNOSIS — E872 Acidosis: Secondary | ICD-10-CM | POA: Diagnosis present

## 2021-04-13 LAB — CBC WITH DIFFERENTIAL/PLATELET
Abs Immature Granulocytes: 0.06 10*3/uL (ref 0.00–0.07)
Basophils Absolute: 0 10*3/uL (ref 0.0–0.1)
Basophils Relative: 0 %
Eosinophils Absolute: 0 10*3/uL (ref 0.0–0.5)
Eosinophils Relative: 0 %
HCT: 39.6 % (ref 39.0–52.0)
Hemoglobin: 13.4 g/dL (ref 13.0–17.0)
Immature Granulocytes: 1 %
Lymphocytes Relative: 13 %
Lymphs Abs: 1.7 10*3/uL (ref 0.7–4.0)
MCH: 31.4 pg (ref 26.0–34.0)
MCHC: 33.8 g/dL (ref 30.0–36.0)
MCV: 92.7 fL (ref 80.0–100.0)
Monocytes Absolute: 0.8 10*3/uL (ref 0.1–1.0)
Monocytes Relative: 6 %
Neutro Abs: 10.1 10*3/uL — ABNORMAL HIGH (ref 1.7–7.7)
Neutrophils Relative %: 80 %
Platelets: 168 10*3/uL (ref 150–400)
RBC: 4.27 MIL/uL (ref 4.22–5.81)
RDW: 13.2 % (ref 11.5–15.5)
WBC: 12.6 10*3/uL — ABNORMAL HIGH (ref 4.0–10.5)
nRBC: 0 % (ref 0.0–0.2)

## 2021-04-13 LAB — TROPONIN I (HIGH SENSITIVITY)
Troponin I (High Sensitivity): 65 ng/L — ABNORMAL HIGH (ref <18)
Troponin I (High Sensitivity): 68 ng/L — ABNORMAL HIGH (ref <18)

## 2021-04-13 LAB — COMPREHENSIVE METABOLIC PANEL
ALT: 24 U/L (ref 0–44)
AST: 38 U/L (ref 15–41)
Albumin: 4 g/dL (ref 3.5–5.0)
Alkaline Phosphatase: 71 U/L (ref 38–126)
Anion gap: 9 (ref 5–15)
BUN: 25 mg/dL — ABNORMAL HIGH (ref 8–23)
CO2: 23 mmol/L (ref 22–32)
Calcium: 8.8 mg/dL — ABNORMAL LOW (ref 8.9–10.3)
Chloride: 104 mmol/L (ref 98–111)
Creatinine, Ser: 1.46 mg/dL — ABNORMAL HIGH (ref 0.61–1.24)
GFR, Estimated: 48 mL/min — ABNORMAL LOW (ref >60)
Glucose, Bld: 155 mg/dL — ABNORMAL HIGH (ref 70–99)
Potassium: 4.1 mmol/L (ref 3.5–5.1)
Sodium: 136 mmol/L (ref 135–145)
Total Bilirubin: 1 mg/dL (ref 0.3–1.2)
Total Protein: 7.6 g/dL (ref 6.5–8.1)

## 2021-04-13 LAB — MAGNESIUM: Magnesium: 2 mg/dL (ref 1.7–2.4)

## 2021-04-13 LAB — RESP PANEL BY RT-PCR (FLU A&B, COVID) ARPGX2
Influenza A by PCR: NEGATIVE
Influenza B by PCR: NEGATIVE
SARS Coronavirus 2 by RT PCR: NEGATIVE

## 2021-04-13 LAB — LACTIC ACID, PLASMA
Lactic Acid, Venous: 2 mmol/L (ref 0.5–1.9)
Lactic Acid, Venous: 1.7 mmol/L (ref 0.5–1.9)

## 2021-04-13 LAB — BRAIN NATRIURETIC PEPTIDE: B Natriuretic Peptide: 882.4 pg/mL — ABNORMAL HIGH (ref 0.0–100.0)

## 2021-04-13 MED ORDER — ONDANSETRON HCL 4 MG PO TABS: 4.0000 mg | ORAL_TABLET | Freq: Four times a day (QID) | ORAL | Status: DC | PRN

## 2021-04-13 MED ORDER — ACETAMINOPHEN 650 MG RE SUPP: 650.0000 mg | Freq: Four times a day (QID) | RECTAL | Status: DC | PRN

## 2021-04-13 MED ORDER — ACETAMINOPHEN 500 MG PO TABS
1000.0000 mg | ORAL_TABLET | Freq: Once | ORAL | Status: AC
  Administered 2021-04-13: 1000 mg via ORAL
  Filled 2021-04-13: qty 2

## 2021-04-13 MED ORDER — SODIUM CHLORIDE 0.9 % IV SOLN: 2.0000 g | INTRAVENOUS | Status: DC

## 2021-04-13 MED ORDER — ONDANSETRON HCL 4 MG/2ML IJ SOLN: 4.0000 mg | Freq: Four times a day (QID) | INTRAMUSCULAR | Status: DC | PRN

## 2021-04-13 MED ORDER — ATORVASTATIN CALCIUM 80 MG PO TABS: 80.0000 mg | ORAL_TABLET | Freq: Every day | ORAL | Status: DC

## 2021-04-13 MED ORDER — FUROSEMIDE 10 MG/ML IJ SOLN
40.0000 mg | Freq: Once | INTRAMUSCULAR | Status: AC
  Administered 2021-04-13: 40 mg via INTRAVENOUS
  Filled 2021-04-13: qty 4

## 2021-04-13 MED ORDER — ENALAPRIL MALEATE 2.5 MG PO TABS
2.5000 mg | ORAL_TABLET | Freq: Every day | ORAL | Status: DC
  Filled 2021-04-13: qty 1

## 2021-04-13 MED ORDER — SODIUM CHLORIDE 0.9 % IV SOLN: 500.0000 mg | INTRAVENOUS | Status: DC

## 2021-04-13 MED ORDER — SODIUM CHLORIDE 0.9 % IV SOLN
500.0000 mg | INTRAVENOUS | Status: DC
  Administered 2021-04-13 – 2021-04-14 (×2): 500 mg via INTRAVENOUS
  Filled 2021-04-13 (×3): qty 500

## 2021-04-13 MED ORDER — LACTATED RINGERS IV SOLN: INTRAVENOUS | Status: DC

## 2021-04-13 MED ORDER — ADULT MULTIVITAMIN W/MINERALS CH
1.0000 | ORAL_TABLET | Freq: Every evening | ORAL | Status: DC
  Administered 2021-04-14 – 2021-04-15 (×2): 1 via ORAL
  Filled 2021-04-13 (×3): qty 1

## 2021-04-13 MED ORDER — FERROUS SULFATE 325 (65 FE) MG PO TABS: ORAL_TABLET | Freq: Every day | ORAL | Status: DC

## 2021-04-13 MED ORDER — SODIUM CHLORIDE 0.9 % IV SOLN
2.0000 g | INTRAVENOUS | Status: DC
  Administered 2021-04-13 – 2021-04-15 (×3): 2 g via INTRAVENOUS
  Filled 2021-04-13 (×4): qty 20

## 2021-04-13 MED ORDER — METOPROLOL SUCCINATE ER 50 MG PO TB24: 50.0000 mg | ORAL_TABLET | Freq: Every day | ORAL | Status: DC

## 2021-04-13 MED ORDER — OMEGA-3-ACID ETHYL ESTERS 1 G PO CAPS
ORAL_CAPSULE | Freq: Every evening | ORAL | Status: DC
  Filled 2021-04-13 (×2): qty 1

## 2021-04-13 MED ORDER — LACTATED RINGERS IV BOLUS (SEPSIS)
500.0000 mL | Freq: Once | INTRAVENOUS | Status: AC
  Administered 2021-04-13: 500 mL via INTRAVENOUS

## 2021-04-13 MED ORDER — PANTOPRAZOLE SODIUM 40 MG PO TBEC
40.0000 mg | DELAYED_RELEASE_TABLET | Freq: Every day | ORAL | Status: DC
  Administered 2021-04-14 – 2021-04-15 (×2): 40 mg via ORAL
  Filled 2021-04-13 (×2): qty 1

## 2021-04-13 MED ORDER — ACETAMINOPHEN 325 MG PO TABS
650.0000 mg | ORAL_TABLET | Freq: Four times a day (QID) | ORAL | Status: DC | PRN
  Administered 2021-04-14 – 2021-04-15 (×2): 650 mg via ORAL
  Filled 2021-04-13 (×2): qty 2

## 2021-04-13 MED ORDER — APIXABAN 5 MG PO TABS: 5.0000 mg | ORAL_TABLET | Freq: Two times a day (BID) | ORAL | Status: DC

## 2021-04-13 NOTE — ED Provider Notes (Signed)
[facility] Emergency Department Provider Note ____________________________________________   Event Date/Time   First MD Initiated Contact with Patient 04/13/21 1809     (approximate)  I have reviewed the triage vital signs and the nursing notes.  HISTORY  Chief Complaint Shortness of Breath, Cough, and Fever   HPI Alexander MeadowRaymond E Jared Jr. is a 80 y.o. malewho presents to the ED for evaluation of shortness of breath.  Chart review indicates history of HTN, HLD, DM and sick sinus syndrome.  CKD 3.  Dual-chamber pacer implanted 09/28/2020.  Follows with Dr. Darrold JunkerParaschos, Cardiology.  Anticoagulated on Eliquis.  Takes midodrine 2.5 3 times daily.  Torsemide 20 mg.  Patient presents to the ED for evaluation of about 3 days of cough, shortness of breath and fever.  Majority of history obtained through EMS as patient is a little disoriented when I talk with him.  History limited due to his disorientation  Fever 101 F at home, for which he took Tylenol just prior to arrival.  Past Medical History:  Diagnosis Date   Grade I diastolic dysfunction    Hyperlipidemia    Hypertension    Pre-diabetes    Stroke [facility](HCC)     Patient Active Problem List   Diagnosis Date Noted   Bradycardia 09/25/2020   Fever 09/24/2020   Bradycardia, unspecified 09/24/2020   Pre-diabetes    Hypokalemia    Mild protein malnutrition (HCC)    Grade I diastolic dysfunction    Hyperlipidemia    Altered mental status    TIA (transient ischemic attack) 09/17/2020   Abnormal SPEP 06/23/2020   Primary hypertension 06/23/2020   Sinus bradycardia 06/22/2020   Premature ventricular contractions 06/22/2020   Heart palpitations 06/22/2020   Dizziness 06/22/2020   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   CVA (cerebral vascular accident) (HCC) 05/30/2018   Muscle strain of thigh 05/15/2018   Trochanteric bursitis of right hip 02/14/2018   Hip fracture, unspecified  laterality, closed, initial encounter (HCC) 09/28/2017   Hip fracture (HCC) 09/28/2017    Past Surgical History:  Procedure Laterality Date   COLONOSCOPY WITH PROPOFOL N/A 01/14/2020   Procedure: COLONOSCOPY WITH PROPOFOL;  Surgeon: Toledo, Boykin Nearingeodoro K, MD;  Location: [facility] ENDOSCOPY;  Service: Gastroenterology;  Laterality: N/A;   ESOPHAGOGASTRODUODENOSCOPY (EGD) WITH PROPOFOL N/A 01/14/2020   Procedure: ESOPHAGOGASTRODUODENOSCOPY (EGD) WITH PROPOFOL;  Surgeon: Toledo, Boykin Nearingeodoro K, MD;  Location: [facility] ENDOSCOPY;  Service: Gastroenterology;  Laterality: N/A;   HIP ARTHROPLASTY Right 09/29/2017   Procedure: ARTHROPLASTY BIPOLAR HIP (HEMIARTHROPLASTY);  Surgeon: Deeann SaintMiller, Howard, MD;  Location: [facility] ORS;  Service: Orthopedics;  Laterality: Right;   JOINT REPLACEMENT     PACEMAKER IMPLANT N/A 09/28/2020   Procedure: PACEMAKER IMPLANT;  Surgeon: Marcina MillardParaschos, Alexander, MD;  Location: [facility] INVASIVE CV LAB;  Service: Cardiovascular;  Laterality: N/A;   TEE WITHOUT CARDIOVERSION N/A 07/26/2018   Procedure: TRANSESOPHAGEAL ECHOCARDIOGRAM (TEE);  Surgeon: Laurier NancyKhan, Shaukat A, MD;  Location: [facility] ORS;  Service: Cardiovascular;  Laterality: N/A;    Prior to Admission medications   Medication Sig Start Date End Date Taking? Authorizing Provider  apixaban (ELIQUIS) 5 MG TABS tablet Take 5 mg by mouth 2 (two) times daily.     [provider]  Apoaequorin (PREVAGEN PO) Take 1 tablet by mouth daily.     [provider]  atorvastatin (LIPITOR) 80 MG tablet Take 1 tablet (80 mg total) by mouth daily. 06/04/18   Shaune Pollackhen, Qing, MD  B Complex Vitamins (VITAMIN B COMPLEX PO) Take  1 tablet by mouth daily.     [provider]  Cholecalciferol 125 MCG (5000 UT) TABS Take 5,000 Units by mouth daily.     [provider]  Coconut Oil 1000 MG CAPS Take 1,000 mg by mouth every evening.     [provider]  Coenzyme Q10 (COQ-10) 100 MG CAPS Take 1 capsule by mouth every evening.     [provider]  D-MANNOSE PO Take 600 mg by mouth in the morning and at bedtime.    [provider]  enalapril (VASOTEC) 2.5 MG tablet Take 2.5 mg by mouth daily.  04/01/18   [provider]  Ferrous Sulfate (IRON PO) Take 1 tablet by mouth daily.    [provider]  fluticasone (FLONASE) 50 MCG/ACT nasal spray Place 1-2 sprays into both nostrils daily as needed (allergies.).  08/18/19   [provider]  metFORMIN (GLUCOPHAGE-XR) 500 MG 24 hr tablet Take 500 mg by mouth daily. 04/08/18   [provider]  metoprolol succinate (TOPROL-XL) 50 MG 24 hr tablet Take 1 tablet (50 mg total) by mouth daily. Take with or immediately following a meal. 09/30/20   Esaw Grandchild A, DO  Multiple Vitamin (MULTIVITAMIN WITH MINERALS) TABS tablet Take 1 tablet by mouth every evening. Senior Multivitamin    [provider]  Multiple Vitamins-Minerals (ANTIOXIDANT FORMULA) TABS Take 1 tablet by mouth every evening. Super Antioxidant    [provider]  Omega 3-6-9 Fatty Acids (OMEGA-3-6-9 PO) Take 1 capsule by mouth every evening.    [provider]  omeprazole (PRILOSEC) 20 MG capsule Take 20 mg by mouth every evening.     [provider]  OVER THE COUNTER MEDICATION Take 1 capsule by mouth daily. Lion's mane Supplement    [provider]  Sodium Fluoride (CLINPRO 5000) 1.1 % PSTE Place 1 application onto teeth in the morning and at bedtime.     [provider]    Allergies Sulfa antibiotics  Family History  Problem Relation Age of Onset   CAD Father    CAD Brother     Social History Social History   Tobacco Use   Smoking status: Never   Smokeless tobacco: Never  Vaping Use   Vaping Use: Never used  Substance Use Topics   Alcohol use: No    Alcohol/week: 0.0 standard drinks   Drug use: No    Review of Systems  Unable to be accurately assessed due to patient's  disorientation ____________________________________________   PHYSICAL EXAM:  VITAL SIGNS: Vitals:   04/13/21 1821 04/13/21 1830  BP: (!) 147/66 134/76  Pulse: 94 90  Resp:  (!) 27  Temp: 100.1 F (37.8 C)   SpO2: (!) 87% 95%      Constitutional: Alert and oriented only to self.  Very wet-sounding cough frequently while I am in the room.  Tachypneic without distress. Eyes: Conjunctivae are normal. PERRL. EOMI. Head: Atraumatic. Nose: No congestion/rhinnorhea. Mouth/Throat: Mucous membranes are moist.  Oropharynx non-erythematous. Neck: No stridor. No cervical spine tenderness to palpation. Cardiovascular: Normal rate, regular rhythm. Grossly normal heart sounds.  Good peripheral circulation. Respiratory: Tachypneic to the mid 20s, no retractions.  Bibasilar crackles are present. Gastrointestinal: Soft , nondistended, nontender to palpation. No CVA tenderness. Musculoskeletal: No lower extremity tenderness.  No joint effusions. No signs of acute trauma. Trace pitting edema to bilateral lower extremities symmetrically without overlying skin changes or signs of trauma. Neurologic:  Normal speech and language. No gross focal  neurologic deficits are appreciated. No gait instability noted. Skin:  Skin is warm, dry and intact. No rash noted. Psychiatric: Mood and affect are normal. Speech and behavior are normal.  ____________________________________________   LABS (all labs ordered are listed, but only abnormal results are displayed)  Labs Reviewed  CBC WITH DIFFERENTIAL/PLATELET - Abnormal; Notable for the following components:      Result Value   WBC 12.6 (*)    Neutro Abs 10.1 (*)    All other components within normal limits  COMPREHENSIVE METABOLIC PANEL - Abnormal; Notable for the following components:   Glucose, Bld 155 (*)    BUN 25 (*)    Creatinine, Ser 1.46 (*)    Calcium 8.8 (*)    GFR, Estimated 48 (*)    All other components within normal limits  BRAIN  NATRIURETIC PEPTIDE - Abnormal; Notable for the following components:   B Natriuretic Peptide 882.4 (*)    All other components within normal limits  LACTIC ACID, PLASMA - Abnormal; Notable for the following components:   Lactic Acid, Venous 2.0 (*)    All other components within normal limits  TROPONIN I (HIGH SENSITIVITY) - Abnormal; Notable for the following components:   Troponin I (High Sensitivity) 65 (*)    All other components within normal limits  RESP PANEL BY RT-PCR (FLU A&B, COVID) ARPGX2  CULTURE, BLOOD (ROUTINE X 2)  CULTURE, BLOOD (ROUTINE X 2)  MAGNESIUM  LACTIC ACID, PLASMA   ____________________________________________  12 Lead EKG  Sinus rhythm, rate of 98 bpm.  Normal axis.  Left bundle branch block morphology without ischemic features. ____________________________________________  RADIOLOGY  ED MD interpretation: 1 view CXR reviewed by me with left basilar infiltrate/atelectasis  Official radiology report(s): DG Chest Portable 1 View  Result Date: 04/13/2021 CLINICAL DATA:  Hypoxia EXAM: PORTABLE CHEST 1 VIEW COMPARISON:  09/28/2020 FINDINGS: Surgical plate and fixating screws at the left scapula. Left-sided dual lead pacing device with lead tips projecting over right atrium and cardiac apex. Slight increased loop in the right atrial lead compared to prior. Patchy atelectasis left base. Stable cardiomediastinal silhouette. No pneumothorax or pleural effusion. IMPRESSION: Minimal patchy atelectasis at the left base. Electronically Signed   By: Jasmine Pang M.D.   On: 04/13/2021 19:07    ____________________________________________   PROCEDURES and INTERVENTIONS  Procedure(s) performed (including Critical Care):  .1-3 Lead EKG Interpretation  Date/Time: 04/13/2021 7:29 PM Performed by: Delton Prairie, MD Authorized by: Delton Prairie, MD     Interpretation: normal     ECG rate:  90   ECG rate assessment: normal     Rhythm: sinus rhythm     Ectopy: none      Conduction: normal   .Critical Care  Date/Time: 04/13/2021 7:30 PM Performed by: Delton Prairie, MD Authorized by: Delton Prairie, MD   Critical care provider statement:    Critical care time (minutes):  45   Critical care was necessary to treat or prevent imminent or life-threatening deterioration of the following conditions:  Sepsis   Critical care was time spent personally by me on the following activities:  Discussions with consultants, evaluation of patient's response to treatment, examination of patient, ordering and performing treatments and interventions, ordering and review of laboratory studies, ordering and review of radiographic studies, pulse oximetry, re-evaluation of patient's condition, obtaining history from patient or surrogate and review of old charts  Medications  lactated ringers infusion (has no administration in time range)  lactated ringers bolus 500 mL (has no  administration in time range)  cefTRIAXone (ROCEPHIN) 2 g in sodium chloride 0.9 % 100 mL IVPB (2 g Intravenous New Bag/Given 04/13/21 1925)  azithromycin (ZITHROMAX) 500 mg in sodium chloride 0.9 % 250 mL IVPB (has no administration in time range)  furosemide (LASIX) injection 40 mg (has no administration in time range)    ____________________________________________   MDM / ED COURSE   80 year old male presents to the ED with evidence of sepsis due to pneumonia and CHF exacerbation requiring medical admission.  Hypoxic on room air to 87% requiring 2 L nasal cannula, otherwise stable.  Exam with some nonfocal disorientation, possibly metabolic encephalopathy.  No distress, but noted to be tachypneic with a wet-sounding cough.  Blood work with stigmata of sepsis with leukocytosis and lactic acidosis.  BNP is elevated and clinically he is volume overload.  We will withhold full 30 cc/kg fluid bolus due to his evidence of volume overload, provide small boluses of fluids as we also initiate diuresis and CAP  coverage.  We will admit to hospitalist for further work-up and management.  No evidence of ACS.     ____________________________________________   FINAL CLINICAL IMPRESSION(S) / ED DIAGNOSES  Final diagnoses:  Sepsis with acute hypoxic respiratory failure without septic shock, due to unspecified organism (HCC)  Acute on chronic diastolic congestive heart failure [facility])     ED Discharge Orders     None        Jailee Jaquez   Note:  This document was prepared using Dragon voice recognition software and may include unintentional dictation errors.    Delton Prairie, MD 04/13/21 [phone]

## 2021-04-13 NOTE — Consult Note (Signed)
CODE SEPSIS - PHARMACY COMMUNICATION  **Broad Spectrum Antibiotics should be administered within 1 hour of Sepsis diagnosis**  Time Code Sepsis Called/Page Received: 1912  Antibiotics Ordered: 1912  Time of 1st antibiotic administration: 1925  Additional action taken by pharmacy: N/A  If necessary, Name of Provider/Nurse Contacted: N/A    Derrek Gu ,PharmD Clinical Pharmacist  04/13/2021  7:15 PM

## 2021-04-13 NOTE — ED Triage Notes (Addendum)
Pt presents to the [facility] via EMS from home with c/o shortness of breath, cough, and fever on going for 3 days. Pt reports having fever of 101 at home but took Tylenol prior to arrival. Pt noted to be febrile, tachypnic and hypoxic on room air upon arrival to ED. Pt alert but only oriented to person and place.

## 2021-04-13 NOTE — H&P (Signed)
History and Physical    Alexander Kline. EPP:[id number] DOB: 05/31/1941 DOA: 04/13/2021  PCP: Sherron Monday, MD   Patient coming from: Home  I have personally briefly reviewed patient's old medical records in [facility]  Chief Complaint: Shortness of breath   Most of the history was obtained from patient's wife who was at the bedside  HPI: Alexander Sweetin. is a 80 y.o. 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 Patient states that he had a fever on the day of admission with a T-max of 101F and she administered Tylenol prior to his arrival to the ER.  Patient was noted to be tachypneic and was hypoxic on room air with pulse oximetry of 87% requiring oxygen supplementation at 2 L to improve pulse oximetry to 95%.  He is only oriented to person and place and at his baseline, he is usually awake, alert and oriented to person, place and time. According to the wife his cough is productive but she is unable to tell me what color the phlegm is. He received 2 doses of the COVID-19 vaccine and a booster dose as well.  He had a home COVID-19 test which was negative. I am unable to do a review of systems on this patient due to his mental status. Labs show sodium 136, potassium 4.1, chloride 104, bicarb 23, glucose 155, BUN 25, creatinine 1.46, calcium 8.8, magnesium 2.0, alkaline phosphatase 71, albumin 4.0, AST 38, ALT 24, total protein 7.6, BNP 882, troponin 65, lactic acid 2.0, white count 12.6, hemoglobin 13.4, hematocrit 39.6, MCV 92.7, RDW 13.2, platelet count 168 Respiratory viral panel is negative Chest x-ray reviewed by  me shows infiltrate at the left base. CT scan of the chest without contrast shows multi lobar pneumonia, possibly atypical or viral in etiology. Aspiration is not excluded. Clinical correlation and follow-up to resolution recommended.  Twelve-lead EKG reviewed by me shows shows an irregular rhythm most likely A. fib    ED Course: Patient is an 80 year old male who was brought into the ER for evaluation of a 3-day history of fever, productive cough and shortness of breath. He has a history of mild cognitive dysfunction but is lethargic and only oriented to person and place which is not his baseline. He was hypoxic upon arrival to the ER with room air pulse oximetry of 87% and is currently on 2 L of oxygen with improvement in his pulse oximetry to 95%. He had a T-max of 101F, was tachycardic and tachypneic and chest x-ray shows an infiltrate in the left base with leukocytosis as well as elevated lactic acid levels. Patient received only 500 cc fluid bolus in the ER due to concerns of superimposed CHF. He is currently on maintenance IV fluids and will be admitted to the hospital for further evaluation.     Review of Systems: As per HPI otherwise all other systems reviewed and negative.    Past Medical History:  Diagnosis Date   Grade I diastolic dysfunction    Hyperlipidemia    Hypertension    Pre-diabetes    Stroke [facility])     Past Surgical History:  Procedure Laterality Date  COLONOSCOPY WITH PROPOFOL N/A 01/14/2020   Procedure: COLONOSCOPY WITH PROPOFOL;  Surgeon: Toledo, Boykin Nearing, MD;  Location: [facility] ENDOSCOPY;  Service: Gastroenterology;  Laterality: N/A;   ESOPHAGOGASTRODUODENOSCOPY (EGD) WITH PROPOFOL N/A 01/14/2020   Procedure: ESOPHAGOGASTRODUODENOSCOPY (EGD) WITH PROPOFOL;  Surgeon: Toledo, Boykin Nearing, MD;  Location: [facility] ENDOSCOPY;  Service: Gastroenterology;  Laterality: N/A;   HIP ARTHROPLASTY Right 09/29/2017   Procedure: ARTHROPLASTY BIPOLAR HIP (HEMIARTHROPLASTY);   Surgeon: Deeann Saint, MD;  Location: [facility] ORS;  Service: Orthopedics;  Laterality: Right;   JOINT REPLACEMENT     PACEMAKER IMPLANT N/A 09/28/2020   Procedure: PACEMAKER IMPLANT;  Surgeon: Marcina Millard, MD;  Location: [facility] INVASIVE CV LAB;  Service: Cardiovascular;  Laterality: N/A;   TEE WITHOUT CARDIOVERSION N/A 07/26/2018   Procedure: TRANSESOPHAGEAL ECHOCARDIOGRAM (TEE);  Surgeon: Laurier Nancy, MD;  Location: [facility] ORS;  Service: Cardiovascular;  Laterality: N/A;     reports that he has never smoked. He has never used smokeless tobacco. He reports that he does not drink alcohol and does not use drugs.  Allergies  Allergen Reactions   Sulfa Antibiotics     Per pt, he gets bad headaches with sulfa drugs    Family History  Problem Relation Age of Onset   CAD Father    CAD Brother       Prior to Admission medications   Medication Sig Start Date End Date Taking? Authorizing Provider  apixaban (ELIQUIS) 5 MG TABS tablet Take 5 mg by mouth 2 (two) times daily.     [provider]  Apoaequorin (PREVAGEN PO) Take 1 tablet by mouth daily.     [provider]  atorvastatin (LIPITOR) 80 MG tablet Take 1 tablet (80 mg total) by mouth daily. 06/04/18   Shaune Pollack, MD  B Complex Vitamins (VITAMIN B COMPLEX PO) Take 1 tablet by mouth daily.     [provider]  Cholecalciferol 125 MCG (5000 UT) TABS Take 5,000 Units by mouth daily.     [provider]  Coconut Oil 1000 MG CAPS Take 1,000 mg by mouth every evening.     [provider]  Coenzyme Q10 (COQ-10) 100 MG CAPS Take 1 capsule by mouth every evening.     [provider]  D-MANNOSE PO Take 600 mg by mouth in the morning and at bedtime.    [provider]  enalapril (VASOTEC) 2.5 MG tablet Take 2.5 mg by mouth daily.  04/01/18   [provider]  Ferrous Sulfate (IRON PO) Take 1 tablet by mouth daily.    [provider]  fluticasone (FLONASE) 50 MCG/ACT  nasal spray Place 1-2 sprays into both nostrils daily as needed (allergies.).  08/18/19   [provider]  metFORMIN (GLUCOPHAGE-XR) 500 MG 24 hr tablet Take 500 mg by mouth daily. 04/08/18   [provider]  metoprolol succinate (TOPROL-XL) 50 MG 24 hr tablet Take 1 tablet (50 mg total) by mouth daily. Take with or immediately following a meal. 09/30/20   Esaw Grandchild A, DO  Multiple Vitamin (MULTIVITAMIN WITH MINERALS) TABS tablet Take 1 tablet by mouth every evening. Senior Multivitamin    [provider]  Multiple Vitamins-Minerals (ANTIOXIDANT FORMULA) TABS Take 1 tablet by mouth every evening. Super Antioxidant    [provider]  Omega 3-6-9 Fatty Acids (OMEGA-3-6-9 PO) Take 1 capsule by mouth every evening.    [provider]  omeprazole (PRILOSEC) 20 MG capsule Take 20 mg by mouth every evening.     [provider]  OVER THE COUNTER MEDICATION Take 1 capsule by mouth daily. Lion's mane Supplement    [provider]  Sodium Fluoride (CLINPRO 5000) 1.1 % PSTE Place 1 application onto teeth in the morning and at bedtime.     [provider]    Physical Exam: Vitals:   04/13/21 1821 04/13/21 1823 04/13/21 1830  BP: (!) 147/66  134/76  Pulse: 94  90  Resp:   (!) 27  Temp: 100.1 F (37.8 C)    TempSrc: Oral    SpO2: (!) 87%  95%  Weight:  110 kg   Height:  5\' 11"  (1.803 m)      Vitals:   04/13/21 1821 04/13/21 1823 04/13/21 1830  BP: (!) 147/66  134/76  Pulse: 94  90  Resp:   (!) 27  Temp: 100.1 F (37.8 C)    TempSrc: Oral    SpO2: (!) 87%  95%  Weight:  110 kg   Height:  5\' 11"  (1.803 m)       Constitutional: Lethargic but arouses to verbal stimuli.  Oriented to person and place. Not in any apparent distress HEENT:      Head: Normocephalic and atraumatic.         Eyes: PERLA, EOMI, Conjunctivae are normal. Sclera is non-icteric.       Mouth/Throat: Mucous membranes are dry.       Neck: Supple  with no signs of meningismus. Cardiovascular: Regular rate and rhythm. No murmurs, gallops, or rubs. 2+ symmetrical distal pulses are present . No JVD. No LE edema Respiratory: Tachypneic with chronically respiration.scattered rhonchi in both lung fields Lt > Rt.  No wheezes or crackles, Gastrointestinal: Soft, non tender, and non distended with positive bowel sounds.  Genitourinary: No CVA tenderness. Musculoskeletal: Nontender with normal range of motion in all extremities. No cyanosis, or erythema of extremities. Neurologic:  Face is symmetric. Moving all extremities.  Generalized weakness. Skin: Skin is warm, dry.  No rash or ulcers Psychiatric: Unable to assess   Labs on Admission: I have personally reviewed following labs and imaging studies  CBC: Recent Labs  Lab 04/13/21 1833  WBC 12.6*  NEUTROABS 10.1*  HGB 13.4  HCT 39.6  MCV 92.7  PLT 168   Basic Metabolic Panel: Recent Labs  Lab 04/13/21 1833  NA 136  K 4.1  CL 104  CO2 23  GLUCOSE 155*  BUN 25*  CREATININE 1.46*  CALCIUM 8.8*  MG 2.0   GFR: Estimated Creatinine Clearance: 50.1 mL/min (A) (by C-G formula based on SCr of 1.46 mg/dL (H)). Liver Function Tests: Recent Labs  Lab 04/13/21 1833  AST 38  ALT 24  ALKPHOS 71  BILITOT 1.0  PROT 7.6  ALBUMIN 4.0   No results for input(s): LIPASE, AMYLASE in the last 168 hours. No results for input(s): AMMONIA in the last 168 hours. Coagulation Profile: No results for input(s): INR, PROTIME in the last 168 hours. Cardiac Enzymes: No results for input(s): CKTOTAL, CKMB, CKMBINDEX, TROPONINI in the last 168 hours. BNP (last 3 results) No results for input(s): PROBNP in the last 8760 hours. HbA1C: No results for input(s): HGBA1C in the last 72 hours. CBG: No results for input(s): GLUCAP in the last 168 hours. Lipid Profile: No results for input(s): CHOL, HDL, LDLCALC, TRIG, CHOLHDL, LDLDIRECT in the last 72 hours. Thyroid Function Tests: No results for  input(s): TSH, T4TOTAL, FREET4, T3FREE, THYROIDAB in the last 72 hours. Anemia Panel: No results for input(s): VITAMINB12,  FOLATE, FERRITIN, TIBC, IRON, RETICCTPCT in the last 72 hours. Urine analysis:    Component Value Date/Time   COLORURINE AMBER (A) 09/24/2020 1134   APPEARANCEUR CLOUDY (A) 09/24/2020 1134   LABSPEC 1.015 09/24/2020 1134   PHURINE 5.0 09/24/2020 1134   GLUCOSEU NEGATIVE 09/24/2020 1134   HGBUR NEGATIVE 09/24/2020 1134   BILIRUBINUR NEGATIVE 09/24/2020 1134   KETONESUR NEGATIVE 09/24/2020 1134   PROTEINUR NEGATIVE 09/24/2020 1134   NITRITE NEGATIVE 09/24/2020 1134   LEUKOCYTESUR NEGATIVE 09/24/2020 1134    Radiological Exams on Admission: CT CHEST WO CONTRAST  Result Date: 04/13/2021 CLINICAL DATA:  80 year old male with pneumonia. EXAM: CT CHEST WITHOUT CONTRAST TECHNIQUE: Multidetector CT imaging of the chest was performed following the standard protocol without IV contrast. COMPARISON:  Chest radiograph dated 04/13/2021 and CT dated 09/30/2017. FINDINGS: Evaluation of this exam is limited due to respiratory motion artifact as well as in the absence of intravenous contrast. Cardiovascular: Mild cardiomegaly. No pericardial effusion. There is coronary vascular calcification. The thoracic aorta and central pulmonary arteries are grossly unremarkable on this noncontrast CT. Left pectoral pacemaker device noted. Mediastinum/Nodes: Top-normal mediastinal lymph nodes measure 10 mm in short axis. The esophagus is grossly unremarkable. No mediastinal fluid collection. Lungs/Pleura: Scattered bilateral ground-glass nodular densities primarily involving the left lower lobe most consistent with multi lobar pneumonia, possibly atypical or viral in etiology. Aspiration is not excluded. Clinical correlation and follow-up to resolution recommended. There is no pleural effusion or pneumothorax. The central airways are patent. Upper Abdomen: No acute abnormality. Musculoskeletal:  Degenerative changes of the spine. No acute osseous pathology. Left scapular fixation plate. IMPRESSION: Multi lobar pneumonia, possibly atypical or viral in etiology. Aspiration is not excluded. Clinical correlation and follow-up to resolution recommended. Electronically Signed   By: Elgie Collard M.D.   On: 04/13/2021 20:13   DG Chest Portable 1 View  Result Date: 04/13/2021 CLINICAL DATA:  Hypoxia EXAM: PORTABLE CHEST 1 VIEW COMPARISON:  09/28/2020 FINDINGS: Surgical plate and fixating screws at the left scapula. Left-sided dual lead pacing device with lead tips projecting over right atrium and cardiac apex. Slight increased loop in the right atrial lead compared to prior. Patchy atelectasis left base. Stable cardiomediastinal silhouette. No pneumothorax or pleural effusion. IMPRESSION: Minimal patchy atelectasis at the left base. Electronically Signed   By: Jasmine Pang M.D.   On: 04/13/2021 19:07     Assessment/Plan Principal Problem:   Sepsis (HCC) Active Problems:   Acute respiratory failure (HCC)   CKD stage 3 due to type 2 diabetes mellitus (HCC)   Acute metabolic encephalopathy   CAP (community acquired pneumonia)   Chronic diastolic CHF (congestive heart failure) (HCC)    Sepsis from community-acquired pneumonia rule out aspiration Patient presents to the ER for evaluation of productive cough, shortness of breath and fever with T-max of 101, tachypnea, leukocytosis and lactic acidosis He is hypoxic and tachypneic with room air pulse oximetry of 87% and requires oxygen supplementation at 2 L to maintain pulse oximetry of 95% Will place patient empirically on Rocephin and Zithromax Judicious IV fluid resuscitation Strict aspiration precautions Speech therapy for swallow function evaluation Follow-up results of blood culture    Acute metabolic encephalopathy Most likely secondary to acute illness At baseline patient has a history of mild cognitive dysfunction but is usually  oriented to person place and time On admission today he is only oriented to person and place and is very lethargic Expect improvement in patient's mental status following resolution of acute illness  Diabetes mellitus with complications of stage III chronic kidney disease Hold metformin Blood sugar checks every 4 hours     Chronic diastolic dysfunction CHF Continue enalapril and metoprolol    History of CVA with small VSD Continue apixaban    DVT prophylaxis: Apixaban Code Status: full code  Family Communication: Greater than 50% of time was spent discussing patient's condition and plan of care with his wife at the bedside.  All questions and concerns have been addressed.  She verbalizes understanding and agrees with the plan.  Patient's daughter Alexander Kline is his healthcare power of attorney. Disposition Plan: Back to previous home environment Consults called: none  Status: At the time of admission, it appears that the appropriate admission status for this patient is inpatient. This is judged to be reasonable and necessary.  Provide the required intensity of service to ensure the patient's safety given the presenting symptoms, physical exam findings, and initial radiographic and laboratory data in the context of their comorbid conditions. Patient requires inpatient status due to high intensity of service, high risk of further deterioration and high frequency of surveillance required.    Lucile Shutters MD [facility]     04/13/2021, 8:22 PM

## 2021-04-14 ENCOUNTER — Encounter: Payer: Self-pay | Admitting: Internal Medicine

## 2021-04-14 DIAGNOSIS — J9601 Acute respiratory failure with hypoxia: Secondary | ICD-10-CM

## 2021-04-14 DIAGNOSIS — A419 Sepsis, unspecified organism: Secondary | ICD-10-CM | POA: Diagnosis not present

## 2021-04-14 DIAGNOSIS — G9341 Metabolic encephalopathy: Secondary | ICD-10-CM | POA: Diagnosis not present

## 2021-04-14 DIAGNOSIS — N183 Chronic kidney disease, stage 3 unspecified: Secondary | ICD-10-CM

## 2021-04-14 DIAGNOSIS — E1122 Type 2 diabetes mellitus with diabetic chronic kidney disease: Secondary | ICD-10-CM

## 2021-04-14 DIAGNOSIS — R6520 Severe sepsis without septic shock: Secondary | ICD-10-CM

## 2021-04-14 DIAGNOSIS — J189 Pneumonia, unspecified organism: Secondary | ICD-10-CM | POA: Diagnosis not present

## 2021-04-14 LAB — BLOOD CULTURE ID PANEL (REFLEXED) - BCID2

## 2021-04-14 LAB — BASIC METABOLIC PANEL
Anion gap: 6 (ref 5–15)
BUN: 27 mg/dL — ABNORMAL HIGH (ref 8–23)
CO2: 29 mmol/L (ref 22–32)
Calcium: 8.6 mg/dL — ABNORMAL LOW (ref 8.9–10.3)
Chloride: 102 mmol/L (ref 98–111)
Creatinine, Ser: 1.57 mg/dL — ABNORMAL HIGH (ref 0.61–1.24)
GFR, Estimated: 44 mL/min — ABNORMAL LOW (ref >60)
Glucose, Bld: 141 mg/dL — ABNORMAL HIGH (ref 70–99)
Potassium: 4.6 mmol/L (ref 3.5–5.1)
Sodium: 137 mmol/L (ref 135–145)

## 2021-04-14 LAB — PROCALCITONIN: Procalcitonin: 6.99 ng/mL

## 2021-04-14 LAB — GLUCOSE, CAPILLARY
Glucose-Capillary: 148 mg/dL — ABNORMAL HIGH (ref 70–99)
Glucose-Capillary: 169 mg/dL — ABNORMAL HIGH (ref 70–99)

## 2021-04-14 LAB — CBC
HCT: 36.7 % — ABNORMAL LOW (ref 39.0–52.0)
Hemoglobin: 12.8 g/dL — ABNORMAL LOW (ref 13.0–17.0)
MCH: 31.5 pg (ref 26.0–34.0)
MCHC: 34.9 g/dL (ref 30.0–36.0)
MCV: 90.4 fL (ref 80.0–100.0)
Platelets: 143 10*3/uL — ABNORMAL LOW (ref 150–400)
RBC: 4.06 MIL/uL — ABNORMAL LOW (ref 4.22–5.81)
RDW: 13.2 % (ref 11.5–15.5)
WBC: 10.3 10*3/uL (ref 4.0–10.5)
nRBC: 0 % (ref 0.0–0.2)

## 2021-04-14 LAB — CORTISOL-AM, BLOOD: Cortisol - AM: 25.5 ug/dL — ABNORMAL HIGH (ref 6.7–22.6)

## 2021-04-14 LAB — PROTIME-INR
INR: 1.4 — ABNORMAL HIGH (ref 0.8–1.2)
Prothrombin Time: 17.6 seconds — ABNORMAL HIGH (ref 11.4–15.2)

## 2021-04-14 MED ORDER — NEPRO/CARBSTEADY PO LIQD
237.0000 mL | Freq: Three times a day (TID) | ORAL | Status: DC
  Administered 2021-04-14 – 2021-04-15 (×2): 237 mL via ORAL

## 2021-04-14 MED ORDER — ROSUVASTATIN CALCIUM 10 MG PO TABS
20.0000 mg | ORAL_TABLET | Freq: Every day | ORAL | Status: DC
  Administered 2021-04-14 – 2021-04-15 (×2): 20 mg via ORAL
  Filled 2021-04-14 (×2): qty 2

## 2021-04-14 MED ORDER — FUROSEMIDE 10 MG/ML IJ SOLN
40.0000 mg | Freq: Once | INTRAMUSCULAR | Status: AC
  Administered 2021-04-14: 40 mg via INTRAVENOUS
  Filled 2021-04-14: qty 4

## 2021-04-14 MED ORDER — FERROUS SULFATE 325 (65 FE) MG PO TABS
325.0000 mg | ORAL_TABLET | Freq: Every day | ORAL | Status: DC
  Administered 2021-04-15: 325 mg via ORAL
  Filled 2021-04-14: qty 1

## 2021-04-14 MED ORDER — METOPROLOL SUCCINATE ER 25 MG PO TB24
12.5000 mg | ORAL_TABLET | Freq: Every day | ORAL | Status: DC
  Administered 2021-04-14 – 2021-04-16 (×3): 12.5 mg via ORAL
  Filled 2021-04-14 (×3): qty 1

## 2021-04-14 MED ORDER — SODIUM CHLORIDE 0.9 % IV SOLN
INTRAVENOUS | Status: DC | PRN
  Administered 2021-04-14: 250 mL via INTRAVENOUS

## 2021-04-14 MED ORDER — VANCOMYCIN HCL 2000 MG/400ML IV SOLN
2000.0000 mg | Freq: Once | INTRAVENOUS | Status: AC
  Administered 2021-04-14: 2000 mg via INTRAVENOUS
  Filled 2021-04-14: qty 400

## 2021-04-14 MED ORDER — APIXABAN 2.5 MG PO TABS
2.5000 mg | ORAL_TABLET | Freq: Two times a day (BID) | ORAL | Status: DC
  Administered 2021-04-14 (×2): 2.5 mg via ORAL
  Filled 2021-04-14 (×2): qty 1

## 2021-04-14 NOTE — Progress Notes (Signed)
Pharmacy Antibiotic Note  Alexander Kline. is a 80 y.o. male admitted on 04/13/2021 with bacteremia.  Pharmacy has been consulted for Vancomycin dosing.  Plan:  Ordered initial dose of Vancomycin 2 gm once. Vancomycin 1500 mg IV Q 24 hrs. Goal AUC 400-550. Expected AUC: 511.6 SCr used: 1.57  Pharmacy will continue to follow and adjust abx dosing whenever warranted.   Height: 5\' 11"  (180.3 cm) Weight: 110 kg (242 lb 8.1 oz) IBW/kg (Calculated) : 75.3  Temp (24hrs), Avg:99.9 F (37.7 C), Min:98.2 F (36.8 C), Max:100.9 F (38.3 C)  Recent Labs  Lab 04/13/21 1833 04/13/21 2020 04/14/21 0520  WBC 12.6*  --  10.3  CREATININE 1.46*  --  1.57*  LATICACIDVEN 2.0* 1.7  --     Estimated Creatinine Clearance: 46.6 mL/min (A) (by C-G formula based on SCr of 1.57 mg/dL (H)).    Allergies  Allergen Reactions   Sulfa Antibiotics     Per pt, he gets bad headaches with sulfa drugs    Antimicrobials this admission: 06/15 Azithromycin >>  06/15 Ceftriaxone >>  06/15 Vancomycin >>   Microbiology results: 06/15 BCx: 1 (aerobic) of 4 w/ Staph Epi, no reisistance  Thank you for allowing pharmacy to be a part of this patient's care.  7/15, PharmD, MBA 04/15/2021 1:08 AM

## 2021-04-14 NOTE — Progress Notes (Signed)
PHARMACY - PHYSICIAN COMMUNICATION CRITICAL VALUE ALERT - BLOOD CULTURE IDENTIFICATION (BCID)  Alexander Vi. is an 80 y.o. male who presented to [facility] on 04/13/2021 with a chief complaint of cough, shortness of breath and fever. Started on ceftriaxone and azithromycin for CAP. Patient with history significant cor pacemaker placement.   Assessment:  1/4 (aerobic) staphylococcal species + staph epidermidis. No resistance genes detected. Given history of pacemaker and patient sepsis on presentation, possible true infection and not contaminant.   Name of physician (or Provider) Contacted: Dr. Arville Care  Current antibiotics: Ceftriaxone and Azithromycin  Changes to prescribed antibiotics recommended: Recommend Vancomycin to cover staphylococcal species  Recommendations accepted by provider  Results for orders placed or performed during the hospital encounter of 04/13/21  Blood Culture ID Panel (Reflexed) (Collected: 04/13/2021  6:33 PM)  Result Value Ref Range   Enterococcus faecalis NOT DETECTED NOT DETECTED   Enterococcus Faecium NOT DETECTED NOT DETECTED   Listeria monocytogenes NOT DETECTED NOT DETECTED   Staphylococcus species DETECTED (A) NOT DETECTED   Staphylococcus aureus (BCID) NOT DETECTED NOT DETECTED   Staphylococcus epidermidis DETECTED (A) NOT DETECTED   Staphylococcus lugdunensis NOT DETECTED NOT DETECTED   Streptococcus species NOT DETECTED NOT DETECTED   Streptococcus agalactiae NOT DETECTED NOT DETECTED   Streptococcus pneumoniae NOT DETECTED NOT DETECTED   Streptococcus pyogenes NOT DETECTED NOT DETECTED   A.calcoaceticus-baumannii NOT DETECTED NOT DETECTED   Bacteroides fragilis NOT DETECTED NOT DETECTED   Enterobacterales NOT DETECTED NOT DETECTED   Enterobacter cloacae complex NOT DETECTED NOT DETECTED   Escherichia coli NOT DETECTED NOT DETECTED   Klebsiella aerogenes NOT DETECTED NOT DETECTED   Klebsiella oxytoca NOT DETECTED NOT DETECTED   Klebsiella  pneumoniae NOT DETECTED NOT DETECTED   Proteus species NOT DETECTED NOT DETECTED   Salmonella species NOT DETECTED NOT DETECTED   Serratia marcescens NOT DETECTED NOT DETECTED   Haemophilus influenzae NOT DETECTED NOT DETECTED   Neisseria meningitidis NOT DETECTED NOT DETECTED   Pseudomonas aeruginosa NOT DETECTED NOT DETECTED   Stenotrophomonas maltophilia NOT DETECTED NOT DETECTED   Candida albicans NOT DETECTED NOT DETECTED   Candida auris NOT DETECTED NOT DETECTED   Candida glabrata NOT DETECTED NOT DETECTED   Candida krusei NOT DETECTED NOT DETECTED   Candida parapsilosis NOT DETECTED NOT DETECTED   Candida tropicalis NOT DETECTED NOT DETECTED   Cryptococcus neoformans/gattii NOT DETECTED NOT DETECTED   Methicillin resistance mecA/C NOT DETECTED NOT DETECTED    Sharen Hones, PharmD, BCPS Clinical Pharmacist   04/14/2021  10:16 PM

## 2021-04-14 NOTE — Evaluation (Addendum)
Clinical/Bedside Swallow Evaluation Patient Details  Name: Alexander Kline. MRN: [id number] Date of Birth: 1941/01/11  Today's Date: 04/14/2021 Time: SLP Start Time (ACUTE ONLY): 1000 SLP Stop Time (ACUTE ONLY): 1100 SLP Time Calculation (min) (ACUTE ONLY): 60 min  Past Medical History:  Past Medical History:  Diagnosis Date   Grade I diastolic dysfunction    Hyperlipidemia    Hypertension    Pre-diabetes    Stroke [facility])    Past Surgical History:  Past Surgical History:  Procedure Laterality Date   COLONOSCOPY WITH PROPOFOL N/A 01/14/2020   Procedure: COLONOSCOPY WITH PROPOFOL;  Surgeon: Toledo, Boykin Nearing, MD;  Location: [facility] ENDOSCOPY;  Service: Gastroenterology;  Laterality: N/A;   ESOPHAGOGASTRODUODENOSCOPY (EGD) WITH PROPOFOL N/A 01/14/2020   Procedure: ESOPHAGOGASTRODUODENOSCOPY (EGD) WITH PROPOFOL;  Surgeon: Toledo, Boykin Nearing, MD;  Location: [facility] ENDOSCOPY;  Service: Gastroenterology;  Laterality: N/A;   HIP ARTHROPLASTY Right 09/29/2017   Procedure: ARTHROPLASTY BIPOLAR HIP (HEMIARTHROPLASTY);  Surgeon: Deeann Saint, MD;  Location: [facility] ORS;  Service: Orthopedics;  Laterality: Right;   JOINT REPLACEMENT     PACEMAKER IMPLANT N/A 09/28/2020   Procedure: PACEMAKER IMPLANT;  Surgeon: Marcina Millard, MD;  Location: [facility] INVASIVE CV LAB;  Service: Cardiovascular;  Laterality: N/A;   TEE WITHOUT CARDIOVERSION N/A 07/26/2018   Procedure: TRANSESOPHAGEAL ECHOCARDIOGRAM (TEE);  Surgeon: Laurier Nancy, MD;  Location: [facility] ORS;  Service: Cardiovascular;  Laterality: N/A;   HPI:  Pt is a 80 y.o. 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  CT of Chest: Multi lobar pneumonia, possibly atypical or viral in etiology. Aspiration is not excluded. Clinical correlation and follow-up to  resolution recommended.  DG Esophagus 12/2020: hiatal hernia. Mild B ring noted. Exam otherwise unremarkable. No obstructing abnormality.   Assessment / Plan / Recommendation Clinical Impression  Pt appears to present w/ possible pharyngeal phase dysphagia w/ suspected delay in pharyngeal swallow initiation moreso w/ thin liquids thus resulting in overt clinical s/s of aspiration. Also suspect impact from pt's Baseline increased laryngeal/pharyngeal phlegm -- wet, phlegmy vocal quality at Baseline PRIOR to po's noted. MD has now stopped pt's IV fluids and given diuretic. At rest, pt is consistently coughing and expectorating thin phlegm and mucous. Pt and Dtr stated this began 3 days ago followed by sore throat and fever. Dtr reported pt was recently Dx'd w/ CHF by Cardiologist. Pt also has Baseline Esophageal deficits per recent DG Esophagus -- hiatal hernia, B-ring. He is on a PPI. Following aspiration precautions and using a modified diet, pt appeared to demonstrate improved tolerance of po's w/ out immediate, overt s/s of aspiration.   Pt consumed trials of single ice chips, thin and Nectar liquids via Cup, purees, and soft solids w/ instruction on general aspiration precautions - min verbal cues needed for follow throught. Pt exhibited throat clearing and coughing more immediate to the swallow w/ the trials of thin liquids; min to no overt s/s of aspiration noted w/ the Nectar liquids, purees, and soft solids. No decline in respiratory presentation; O2 sats remained 98%. Unable to fully gauge and assess vocal quality d/t Baseline phlegm. Laryngeal excursion adequate. Oral phase appeared [facility] w/ timely bolus management, mastication, and  control of bolus propulsion for A-P transfer for swallowing. Oral clearing achieved w/ all trial consistencies. OM  Exam appeared [facility] w/ no unilateral weakness noted. Speech Clear. Pt fed self w/ setup support.     Recommend a Dysphagia level 3(mech soft) diet w/ Nectar liquids via Cup until MBSS can be completed d/t pt's current presentation and to Dtr's report of inconsisteny throat clearing/cough at home w/ thin liquids "sometimes" -- this does not occur on a daily basis per Dtr's report. Pt also has Esophageal deficits per recent DG Esophagus so recommend Reflux precautions. Recommend general aspiration precautions, Pills WHOLE in Puree for safer, easier swallowing. Education given on Pills in Puree; food consistencies and easy to eat options; general aspiration precautions. MBSS to follow tomorrow. NSG/MD updated. Pt/Dtr agreed. SLP Visit Diagnosis: Dysphagia, pharyngeal phase (R13.13) (Esophageal phase dysmotility)    Aspiration Risk  Mild aspiration risk;Risk for inadequate nutrition/hydration    Diet Recommendation  Dysphagia level 3(mech soft) diet w/ Nectar liquids via Cup; Aspiration and Reflux precautions.  Medication Administration: Whole meds with puree (for safer swallowing)    Other  Recommendations Recommended Consults:  (TBD) Oral Care Recommendations: Oral care BID;Patient independent with oral care;Oral care before and after PO Other Recommendations: Order thickener from pharmacy;Prohibited food (jello, ice cream, thin soups);Remove water pitcher;Have oral suction available   Follow up Recommendations  (TBD)      Frequency and Duration min 3x week  2 weeks       Prognosis Prognosis for Safe Diet Advancement: Fair (-Good) Barriers to Reach Goals: Motivation;Time post onset;Severity of deficits      Swallow Study   General Date of Onset: 04/14/21 HPI: Pt is a 80 y.o. 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  CT of Chest: Multi lobar pneumonia, possibly atypical or viral in etiology. Aspiration is not excluded. Clinical correlation and follow-up to  resolution recommended.  DG Esophagus 12/2020: hiatal hernia. Mild B ring noted. Exam otherwise unremarkable. No obstructing abnormality. Type of Study: Bedside Swallow Evaluation Previous Swallow Assessment: none Diet Prior to this Study: NPO (regular diet at home) Temperature Spikes Noted: No (wbc 10.3) Respiratory Status: Nasal cannula (2L) History of Recent Intubation: No Behavior/Cognition: Alert;Cooperative;Pleasant mood;Requires cueing (min) Oral Cavity Assessment: Within Functional Limits Oral Care Completed by SLP: Yes Oral Cavity - Dentition: Adequate natural dentition Vision: Functional for self-feeding Self-Feeding Abilities: Able to feed self;Needs set up Patient Positioning: Upright in bed (needed min support) Baseline Vocal Quality: Wet (phlegmy at baseline) Volitional Cough: Strong Volitional Swallow: Able to elicit    Oral/Motor/Sensory Function Overall Oral Motor/Sensory Function: Within functional limits   Ice Chips Ice chips: Within functional limits (3 trials) Other Comments: appeared adequate but inconsistent cough to clear phelgm as did at baseline   Thin Liquid Thin Liquid: Impaired Presentation: Cup;Self Fed (10 trials) Oral Phase Impairments:  (none) Oral Phase Functional Implications:  (none) Pharyngeal  Phase Impairments: Cough - Immediate;Throat Clearing - Immediate;Throat Clearing - Delayed (intermittent in conjunction w/ the phlegm but notable)    Nectar Thick Nectar Thick Liquid: Within functional limits (~2-3 ozs) Other Comments: appeared adequate but inconsistent cough to clear phelgm as did at baseline x2  Honey Thick  Honey Thick Liquid: Not tested   Puree Puree: Within functional limits Presentation: Self Fed;Spoon (~4 ozs)   Solid     Solid: Within functional limits Presentation: Self Fed (5 trials) Other Comments: appeared adequate but inconsistent cough to clear phelgm as did at baseline        Jerilynn Som, MS, [facility] [phone] Jackalyn Haith 04/14/2021,4:59 PM

## 2021-04-14 NOTE — Progress Notes (Signed)
Initial Nutrition Assessment  DOCUMENTATION CODES:   Not applicable  INTERVENTION:   Nepro Shake po TID, each supplement provides 425 kcal and 19 grams protein  Magic cup TID with meals, each supplement provides 290 kcal and 9 grams of protein  MVI po daily   Pt at high refeed risk; recommend monitor potassium, magnesium and phosphorus labs daily until stable  NUTRITION DIAGNOSIS:   Inadequate oral intake related to acute illness as evidenced by meal completion < 50%.  GOAL:   Patient will meet greater than or equal to 90% of their needs  MONITOR:   PO intake, Supplement acceptance, Labs, Weight trends, Skin, I & O's  REASON FOR ASSESSMENT:   Malnutrition Screening Tool    ASSESSMENT:   80 y.o. 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  Met with pt in room today. Pt reports good appetite and oral intake at baseline but reports that his appetite has been decreased in hospital. Pt ate 25% of his lunch tray today. Pt is reporting sore throat. Pt reports that he does not drink supplements at home. RD discussed with pt the importance of adequate nutrition needed to preserve lean muscle. Pt would like to have vanilla supplements and Magic Cups in hospital; RD will order. Pt seen by SLP today and placed on a dysphagia 3/nectar thick diet. Per chart, pt is weight stable at baseline.   Medications reviewed and include: ferrous sulfate, MVI, lovaza, protonix, azithromycin, ceftriaxone   Labs reviewed: K 4.6 wnl, BUN 27(H), creat 1.57(H)  NUTRITION - FOCUSED PHYSICAL EXAM:  Flowsheet Row Most Recent Value  Orbital Region No depletion  Upper Arm Region Mild depletion  Thoracic  and Lumbar Region No depletion  Buccal Region No depletion  Temple Region No depletion  Clavicle Bone Region Mild depletion  Clavicle and Acromion Bone Region Mild depletion  Scapular Bone Region No depletion  Dorsal Hand Mild depletion  Patellar Region Mild depletion  Anterior Thigh Region Mild depletion  Posterior Calf Region Mild depletion  Edema (RD Assessment) None  Hair Reviewed  Eyes Reviewed  Mouth Reviewed  Skin Reviewed  Nails Reviewed      Diet Order:   Diet Order             DIET DYS 3 Room service appropriate? Yes with Assist; Fluid consistency: Nectar Thick  Diet effective now                  EDUCATION NEEDS:   Education needs have been addressed  Skin:  Skin Assessment: Reviewed RN Assessment  Last BM:  pta  Height:   Ht Readings from Last 1 Encounters:  04/13/21 '5\' 11"'  (1.803 m)    Weight:   Wt Readings from Last 1 Encounters:  04/13/21 110 kg    Ideal Body Weight:  78 kg  BMI:  Body mass index is 33.82 kg/m.  Estimated Nutritional Needs:   Kcal:  2200-2500kcal/day  Protein:  110-125g/day  Fluid:  2.0-2,3L/day  Koleen Distance MS, RD, LDN Please refer to [facility] for RD and/or RD on-call/weekend/after hours pager

## 2021-04-14 NOTE — Progress Notes (Signed)
[facility]  - [place] [facility]   PATIENT NAME: Alexander Kline    MR#:  [id number]  DATE OF BIRTH:  1941-01-17  SUBJECTIVE:  patient came in with increasing shortness of breath and productive cough. Daughter at bedside. Overall he feels better however has continuous coughing spell well I was in the room. Currently on 2 L nasal cannula oxygen sats greater than 92%. Tolerating PO diet after working with speech therapy earlier.  family does not feel patient is choking at home.  REVIEW OF SYSTEMS:   Review of Systems  Constitutional:  Positive for malaise/fatigue. Negative for chills, fever and weight loss.  HENT:  Negative for ear discharge, ear pain and nosebleeds.   Eyes:  Negative for blurred vision, pain and discharge.  Respiratory:  Positive for cough, sputum production and shortness of breath. Negative for wheezing and stridor.   Cardiovascular:  Negative for chest pain, palpitations, orthopnea and PND.  Gastrointestinal:  Negative for abdominal pain, diarrhea, nausea and vomiting.  Genitourinary:  Negative for frequency and urgency.  Musculoskeletal:  Negative for back pain and joint pain.  Neurological:  Positive for weakness. Negative for sensory change, speech change and focal weakness.  Psychiatric/Behavioral:  Negative for depression and hallucinations. The patient is not nervous/anxious.   Tolerating Diet:yes Tolerating PT: very independent ambulatory and active at baseline  DRUG ALLERGIES:   Allergies  Allergen Reactions  . Sulfa Antibiotics     Per pt, he gets bad headaches with sulfa drugs    VITALS:  Blood pressure 100/72, pulse 88, temperature 98.2 F (36.8 C), resp. rate 17, height 5\' 11"  (1.803 m), weight 110 kg, SpO2 98 %.  PHYSICAL EXAMINATION:   Physical Exam  GENERAL:  80 y.o.-year-old patient lying in the bed with no acute distress.  HEENT: Head atraumatic, normocephalic. Oropharynx and nasopharynx clear.  NECK:  Supple, no jugular  venous distention. No thyroid enlargement, no tenderness.  LUNGS: coarse breath sounds bilaterally, no wheezing, rales, rhonchi. No use of accessory muscles of respiration.  CARDIOVASCULAR: S1, S2 normal. No murmurs, rubs, or gallops.  ABDOMEN: Soft, nontender, nondistended. Bowel sounds present. No organomegaly or mass.  EXTREMITIES: No cyanosis, clubbing or edema b/l.    NEUROLOGIC: grossly nonfocal. No motor or sensory deficit PSYCHIATRIC:  patient is alert and oriented x 3.  SKIN: No obvious rash, lesion, or ulcer.   LABORATORY PANEL:  CBC Recent Labs  Lab 04/14/21 0520  WBC 10.3  HGB 12.8*  HCT 36.7*  PLT 143*    Chemistries  Recent Labs  Lab 04/13/21 1833 04/14/21 0520  NA 136 137  K 4.1 4.6  CL 104 102  CO2 23 29  GLUCOSE 155* 141*  BUN 25* 27*  CREATININE 1.46* 1.57*  CALCIUM 8.8* 8.6*  MG 2.0  --   AST 38  --   ALT 24  --   ALKPHOS 71  --   BILITOT 1.0  --    Cardiac Enzymes No results for input(s): TROPONINI in the last 168 hours. RADIOLOGY:  CT CHEST WO CONTRAST  Result Date: 04/13/2021 CLINICAL DATA:  80 year old male with pneumonia. EXAM: CT CHEST WITHOUT CONTRAST TECHNIQUE: Multidetector CT imaging of the chest was performed following the standard protocol without IV contrast. COMPARISON:  Chest radiograph dated 04/13/2021 and CT dated 09/30/2017. FINDINGS: Evaluation of this exam is limited due to respiratory motion artifact as well as in the absence of intravenous contrast. Cardiovascular: Mild cardiomegaly. No pericardial effusion. There is coronary vascular calcification. The  thoracic aorta and central pulmonary arteries are grossly unremarkable on this noncontrast CT. Left pectoral pacemaker device noted. Mediastinum/Nodes: Top-normal mediastinal lymph nodes measure 10 mm in short axis. The esophagus is grossly unremarkable. No mediastinal fluid collection. Lungs/Pleura: Scattered bilateral ground-glass nodular densities primarily involving the left lower  lobe most consistent with multi lobar pneumonia, possibly atypical or viral in etiology. Aspiration is not excluded. Clinical correlation and follow-up to resolution recommended. There is no pleural effusion or pneumothorax. The central airways are patent. Upper Abdomen: No acute abnormality. Musculoskeletal: Degenerative changes of the spine. No acute osseous pathology. Left scapular fixation plate. IMPRESSION: Multi lobar pneumonia, possibly atypical or viral in etiology. Aspiration is not excluded. Clinical correlation and follow-up to resolution recommended. Electronically Signed   By: Elgie Collard M.D.   On: 04/13/2021 20:13   DG Chest Portable 1 View  Result Date: 04/13/2021 CLINICAL DATA:  Hypoxia EXAM: PORTABLE CHEST 1 VIEW COMPARISON:  09/28/2020 FINDINGS: Surgical plate and fixating screws at the left scapula. Left-sided dual lead pacing device with lead tips projecting over right atrium and cardiac apex. Slight increased loop in the right atrial lead compared to prior. Patchy atelectasis left base. Stable cardiomediastinal silhouette. No pneumothorax or pleural effusion. IMPRESSION: Minimal patchy atelectasis at the left base. Electronically Signed   By: Jasmine Pang M.D.   On: 04/13/2021 19:07   ASSESSMENT AND PLAN:  Alexander Kline. is a 80 y.o. 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 Patient states that he had a fever on the day of admission with a T-max of 101F and she administered Tylenol prior to his arrival to the ER.  Patient was noted to be tachypneic and was hypoxic on room air with pulse oximetry of 87% requiring  oxygen supplementation at 2 L to improve pulse oximetry to 95%.  Sepsis from community-acquired pneumonia rule out aspiration--POA --Patient presents to the ER for evaluation of productive cough, shortness of breath and fever with T-max of 101, tachypnea, leukocytosis and lactic acidosis --He is hypoxic and tachypneic with room air pulse oximetry of 87% and requires oxygen supplementation at 2 L to maintain pulse oximetry of 95% -- on Rocephin and Zithromax -- IV fluid resuscitation per sepsis protocol --Strict aspiration precautions --Speech therapy for swallow function evaluation recommends barium swallow study --Follow-up results of blood culture so far negative -- Pro calcitonin 6.99 -- lactic acid 2.0-- 1.7 -- white count 12.6--- 10.3   Acute metabolic encephalopathy Most likely secondary to acute illness --At baseline patient has a history of mild cognitive dysfunction but is usually oriented to person place and time -- patient's mentation is at baseline according to daughter     Diabetes mellitus with complications of stage III chronic kidney disease --Hold metformin for now. Continue SSI --Blood sugar checks every 4 hours    Chronic diastolic dysfunction CHF --Continue enalapril and metoprolol    History of CVA with small VSD Continue apixaban     Procedures: Family communication : daughter at bedside Consults : none CODE STATUS: full DVT Prophylaxis : eliquis Level of care: Progressive Cardiac Status is: Inpatient  Remains inpatient appropriate because:Inpatient level of care appropriate due to severity  of illness  Dispo: The patient is from: Home              Anticipated d/c is to: Home              Patient currently is not medically stable to d/c.   Difficult to place patient No        TOTAL TIME TAKING CARE OF THIS PATIENT: 35 minutes.  >50% time spent on counselling and coordination of care  Note: This dictation was prepared with Dragon dictation  along with smaller phrase technology. Any transcriptional errors that result from this process are unintentional.  Enedina Finner M.D    [facility]   CC: Primary care physician; Sherron Monday, MD Patient ID: Fransico Meadow., male   DOB: October 30, 1941, 80 y.o.   MRN: [id number]

## 2021-04-15 ENCOUNTER — Inpatient Hospital Stay: Payer: Medicare Other

## 2021-04-15 DIAGNOSIS — A419 Sepsis, unspecified organism: Secondary | ICD-10-CM | POA: Diagnosis not present

## 2021-04-15 LAB — GLUCOSE, CAPILLARY
Glucose-Capillary: 106 mg/dL — ABNORMAL HIGH (ref 70–99)
Glucose-Capillary: 117 mg/dL — ABNORMAL HIGH (ref 70–99)
Glucose-Capillary: 117 mg/dL — ABNORMAL HIGH (ref 70–99)
Glucose-Capillary: 118 mg/dL — ABNORMAL HIGH (ref 70–99)
Glucose-Capillary: 153 mg/dL — ABNORMAL HIGH (ref 70–99)
Glucose-Capillary: 202 mg/dL — ABNORMAL HIGH (ref 70–99)

## 2021-04-15 MED ORDER — VANCOMYCIN HCL 1500 MG/300ML IV SOLN
1500.0000 mg | INTRAVENOUS | Status: DC
  Filled 2021-04-15: qty 300

## 2021-04-15 MED ORDER — TORSEMIDE 20 MG PO TABS
20.0000 mg | ORAL_TABLET | ORAL | Status: DC
  Administered 2021-04-15: 20 mg via ORAL
  Filled 2021-04-15: qty 1

## 2021-04-15 MED ORDER — SODIUM CHLORIDE 0.9 % IV SOLN
500.0000 mg | INTRAVENOUS | Status: AC
  Administered 2021-04-15: 500 mg via INTRAVENOUS
  Filled 2021-04-15: qty 500

## 2021-04-15 MED ORDER — FERROUS GLUCONATE 324 (38 FE) MG PO TABS
324.0000 mg | ORAL_TABLET | Freq: Every day | ORAL | Status: DC
  Administered 2021-04-16: 324 mg via ORAL
  Filled 2021-04-15: qty 1

## 2021-04-15 MED ORDER — RIVASTIGMINE TARTRATE 3 MG PO CAPS
3.0000 mg | ORAL_CAPSULE | Freq: Every day | ORAL | Status: DC
  Administered 2021-04-15: 3 mg via ORAL
  Filled 2021-04-15 (×2): qty 1

## 2021-04-15 MED ORDER — PANTOPRAZOLE SODIUM 40 MG PO TBEC: 40.0000 mg | DELAYED_RELEASE_TABLET | Freq: Every evening | ORAL | Status: DC

## 2021-04-15 MED ORDER — RIVASTIGMINE TARTRATE 1.5 MG PO CAPS
1.5000 mg | ORAL_CAPSULE | Freq: Every day | ORAL | Status: DC
  Administered 2021-04-15 – 2021-04-16 (×2): 1.5 mg via ORAL
  Filled 2021-04-15 (×2): qty 1

## 2021-04-15 MED ORDER — ROSUVASTATIN CALCIUM 10 MG PO TABS
20.0000 mg | ORAL_TABLET | Freq: Every day | ORAL | Status: DC
  Administered 2021-04-16: 20 mg via ORAL
  Filled 2021-04-15: qty 2

## 2021-04-15 MED ORDER — MIDODRINE HCL 5 MG PO TABS
2.5000 mg | ORAL_TABLET | Freq: Three times a day (TID) | ORAL | Status: DC
  Administered 2021-04-15 – 2021-04-16 (×5): 2.5 mg via ORAL
  Filled 2021-04-15 (×5): qty 1

## 2021-04-15 NOTE — Care Management Important Message (Signed)
Important Message  Patient Details  Name: Alexander Kline. MRN: [id number] Date of Birth: [birth year]   Medicare Important Message Given:  N/A - LOS <3 / Initial given by admissions  Initial Medicare IM reviewed by [name], Patient Access Associate on 04/14/2021 at 12:29am.     [name] 04/15/2021, 8:28 AM

## 2021-04-15 NOTE — Evaluation (Signed)
Physical Therapy Evaluation Patient Details Name: Alexander Kline. MRN: [id number] DOB: 17-Sep-1941 Today's Date: 04/15/2021   History of Present Illness  Pt is an 80 y/o M admitted on 04/13/21 with c/o 3 day hx of cough, SOB & fever. Pt being treated for sepsis due to CAP. 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 Pt attempts to ambulate short distance without AD but with min assist, frequently reaching for objects to stabilize himself, and with shuffled gait pattern. Provided pt with RW & pt able to ambulate 100 ft with RW & light min assist with improving gait pattern. Pt does require supplemental O2 during session with lowest SpO2 of 87% but able to recover with rest & pursed lip breathing. Pt would benefit from acute PT services to address high level balance, gait with LRAD, endurance & activity tolerance.    Follow Up Recommendations Home health PT;Supervision for mobility/OOB    Equipment Recommendations       Recommendations for Other Services       Precautions / Restrictions Precautions Precautions: Fall Restrictions Weight Bearing Restrictions: No      Mobility  Bed Mobility Overal bed mobility: Needs Assistance Bed Mobility: Supine to Sit     Supine to sit: HOB elevated;Supervision          Transfers Overall transfer level: Needs assistance Equipment used: None Transfers: Sit to/from Stand Sit to Stand: Min guard            Ambulation/Gait Ambulation/Gait assistance: Min Chemical engineer (Feet): 100 Feet Assistive device: None;Rolling walker (2 wheeled) Gait Pattern/deviations: Decreased step length - right;Decreased step length - left;Decreased dorsiflexion - right;Decreased dorsiflexion -  left;Decreased stride length;Shuffle Gait velocity: decreased   General Gait Details: 10 ft without AD with min assist & pt frequently reaching for objects to stabilize himself & pt with shuffled gait, provided pt with RW & pt with slightly increased BLE step length & stride length, education re: proper use of AD but would benefit from reinforcement especially with lateral gait in small spaces  Stairs            Wheelchair Mobility    Modified Rankin (Stroke Patients Only)       Balance Overall balance assessment: Needs assistance Sitting-balance support: Feet supported;Bilateral upper extremity supported Sitting balance-Leahy Scale: Good Sitting balance - Comments: dons socks sitting EOB with posterior lean but able to correct   Standing balance support: During functional activity;No upper extremity supported Standing balance-Leahy Scale: Fair Standing balance comment: pt able to stand & bath peri area with CGA for standing balance                             Pertinent Vitals/Pain Pain Assessment: No/denies pain    Home Living Family/patient expects to be discharged to:: Private residence Living Arrangements: Spouse/significant other Available Help at Discharge: Family;Available 24 hours/day Type of Home: House Home Access: Stairs to enter Entrance Stairs-Rails: Right Entrance Stairs-Number of Steps: 4-5 Home Layout: One level Home Equipment: Walker - standard      Prior Function Level of Independence: Independent         Comments: Independent without AD, driving, no falls in past 6 months.  Hand Dominance        Extremity/Trunk Assessment   Upper Extremity Assessment Upper Extremity Assessment: Overall WFL for tasks assessed    Lower Extremity Assessment Lower Extremity Assessment: Generalized weakness    Cervical / Trunk Assessment Cervical / Trunk Assessment: Kyphotic  Communication   Communication: No difficulties  Cognition  Arousal/Alertness: Awake/alert Behavior During Therapy: WFL for tasks assessed/performed Overall Cognitive Status: Within Functional Limits for tasks assessed                                 General Comments: requires education re: need for supplemental O2 as pt attempting to doff it at beginning of session stating "I don't need it"      General Comments General comments (skin integrity, edema, etc.): Pt requires 2L/min supplemental O2 via nasal cannula, SPO2 87-96% with cuing for pursed lip breathing during gait; reviewed use of incentive spirometer & flutter valve with pt, educated pt on HHPT f/u & RW recommendations    Exercises     Assessment/Plan    PT Assessment Patient needs continued PT services  PT Problem List Decreased strength;Decreased mobility;Decreased safety awareness;Decreased activity tolerance;Cardiopulmonary status limiting activity;Decreased balance;Decreased knowledge of use of DME       PT Treatment Interventions Therapeutic exercise;DME instruction;Gait training;Balance training;Stair training;Neuromuscular re-education;Functional mobility training;Therapeutic activities;Patient/family education    PT Goals (Current goals can be found in the Care Plan section)  Acute Rehab PT Goals Patient Stated Goal: go home, return to PLOF PT Goal Formulation: With patient/family Time For Goal Achievement: 04/29/21 Potential to Achieve Goals: Good    Frequency Min 2X/week   Barriers to discharge        Co-evaluation               AM-PAC PT "6 Clicks" Mobility  Outcome Measure Help needed turning from your back to your side while in a flat bed without using bedrails?: None Help needed moving from lying on your back to sitting on the side of a flat bed without using bedrails?: A Little Help needed moving to and from a bed to a chair (including a wheelchair)?: A Little Help needed standing up from a chair using your arms (e.g., wheelchair or  bedside chair)?: A Little Help needed to walk in hospital room?: A Little Help needed climbing 3-5 steps with a railing? : A Little 6 Click Score: 19    End of Session Equipment Utilized During Treatment: Gait belt;Oxygen Activity Tolerance: Patient tolerated treatment well (pt self limits, declining additional gait after rest stating "I don't want to push it") Patient left: in chair;with call [place]/phone within reach;with chair alarm set;with family/visitor present Nurse Communication: Mobility status (O2) PT Visit Diagnosis: Unsteadiness on feet (R26.81);Muscle weakness (generalized) (M62.81)    Time: 4709-6283 PT Time Calculation (min) (ACUTE ONLY): 26 min   Charges:   PT Evaluation $PT Eval Low Complexity: 1 Low PT Treatments $Therapeutic Activity: 8-22 mins        Aleda Grana, PT, DPT 04/15/21, 11:00 AM   Sandi Mariscal 04/15/2021, 10:59 AM

## 2021-04-15 NOTE — Progress Notes (Signed)
Modified Barium Swallow Progress Note  Patient Details  Name: Alexander Kline. MRN: [id number] Date of Birth: September 29, 1941  Today's Date: 04/15/2021  Modified Barium Swallow completed.  Full report located under Chart Review in the Imaging Section.  Brief recommendations include the following:  Clinical Impression  Pt presents with mild pharyngeal phase dysphagia that is likely multifactorial in nature. As such he is at a higher risk of developing aspiration pneumonia. Pt has extensive history of esophageal deficits, GERD as well as previous CVA. Currently pt's pharyngeal phase is c/b delayed swallow initiation to the pyriform sinus with silent aspiration of thin liquids during the swallow and penetration of nectar thick liquids. Pt's also doesn't have any hyolaryngeal excursion. During the study several compensatory strategies were attempted but none yield consistent results. At this time, recommend dysphagia 3 diet with nectar thick liquids via cup, medicine whole in puree of with nectar thick liquids and water protocol. Follow up ST services are indicated to target pharyngeal strengthening.   Swallow Evaluation Recommendations       SLP Diet Recommendations: Dysphagia 2 (Fine chop) solids;Nectar thick liquid   Liquid Administration via: Cup   Medication Administration: Whole meds with liquid   Supervision: Patient able to self feed   Compensations: Minimize environmental distractions;Slow rate;Small sips/bites   Postural Changes: Remain semi-upright after after feeds/meals (Comment);Seated upright at 90 degrees   Oral Care Recommendations: Oral care BID (before water)   Other Recommendations: Order thickener from pharmacy;Prohibited food (jello, ice cream, thin soups);Remove water pitcher;Have oral suction available   Alexander Kline B. Dreama Saa M.S., CCC-SLP, [facility] Speech-Language Pathologist Rehabilitation Services Office [phone]  Tempie Gibeault Dreama Saa 04/15/2021,3:04 PM

## 2021-04-15 NOTE — Progress Notes (Addendum)
[facility]  - [place] [facility]   PATIENT NAME: Alexander Kline    MR#:  [id number]  DATE OF BIRTH:  1940/12/22  SUBJECTIVE:  patient came in with increasing shortness of breath and productive cough. Wife at bedside. Overall he feels better   Tolerating PO diet after working with speech therapy earlier. Patient feels he is ready to go home. He still requiring 2 L nasal cannula oxygen. Worked earlier with physical therapy. REVIEW OF SYSTEMS:   Review of Systems  Constitutional:  Positive for malaise/fatigue. Negative for chills, fever and weight loss.  HENT:  Negative for ear discharge, ear pain and nosebleeds.   Eyes:  Negative for blurred vision, pain and discharge.  Respiratory:  Positive for cough, sputum production and shortness of breath. Negative for wheezing and stridor.   Cardiovascular:  Negative for chest pain, palpitations, orthopnea and PND.  Gastrointestinal:  Negative for abdominal pain, diarrhea, nausea and vomiting.  Genitourinary:  Negative for frequency and urgency.  Musculoskeletal:  Negative for back pain and joint pain.  Neurological:  Positive for weakness. Negative for sensory change, speech change and focal weakness.  Psychiatric/Behavioral:  Negative for depression and hallucinations. The patient is not nervous/anxious.   Tolerating Diet:yes Tolerating PT: very independent ambulatory and active at baseline  DRUG ALLERGIES:   Allergies  Allergen Reactions  . Sulfa Antibiotics     Per pt, he gets bad headaches with sulfa drugs    VITALS:  Blood pressure 128/61, pulse 86, temperature 98 F (36.7 C), resp. rate 18, height 5\' 11"  (1.803 m), weight 110 kg, SpO2 99 %.  PHYSICAL EXAMINATION:   Physical Exam  GENERAL:  80 y.o.-year-old patient lying in the bed with no acute distress.  HEENT: Head atraumatic, normocephalic. Oropharynx and nasopharynx clear.  NECK:  Supple, no jugular venous distention. No thyroid enlargement, no tenderness.   LUNGS: coarse breath sounds bilaterally, no wheezing, rales, rhonchi. No use of accessory muscles of respiration.  CARDIOVASCULAR: S1, S2 normal. No murmurs, rubs, or gallops.  ABDOMEN: Soft, nontender, nondistended. Bowel sounds present. No organomegaly or mass.  EXTREMITIES: No cyanosis, clubbing or edema b/l.    NEUROLOGIC: grossly nonfocal. No motor or sensory deficit PSYCHIATRIC:  patient is alert and oriented x 3.  SKIN: No obvious rash, lesion, or ulcer.   LABORATORY PANEL:  CBC Recent Labs  Lab 04/14/21 0520  WBC 10.3  HGB 12.8*  HCT 36.7*  PLT 143*     Chemistries  Recent Labs  Lab 04/13/21 1833 04/14/21 0520  NA 136 137  K 4.1 4.6  CL 104 102  CO2 23 29  GLUCOSE 155* 141*  BUN 25* 27*  CREATININE 1.46* 1.57*  CALCIUM 8.8* 8.6*  MG 2.0  --   AST 38  --   ALT 24  --   ALKPHOS 71  --   BILITOT 1.0  --     Cardiac Enzymes No results for input(s): TROPONINI in the last 168 hours. RADIOLOGY:  CT CHEST WO CONTRAST  Result Date: 04/13/2021 CLINICAL DATA:  80 year old male with pneumonia. EXAM: CT CHEST WITHOUT CONTRAST TECHNIQUE: Multidetector CT imaging of the chest was performed following the standard protocol without IV contrast. COMPARISON:  Chest radiograph dated 04/13/2021 and CT dated 09/30/2017. FINDINGS: Evaluation of this exam is limited due to respiratory motion artifact as well as in the absence of intravenous contrast. Cardiovascular: Mild cardiomegaly. No pericardial effusion. There is coronary vascular calcification. The thoracic aorta and central pulmonary arteries are grossly  unremarkable on this noncontrast CT. Left pectoral pacemaker device noted. Mediastinum/Nodes: Top-normal mediastinal lymph nodes measure 10 mm in short axis. The esophagus is grossly unremarkable. No mediastinal fluid collection. Lungs/Pleura: Scattered bilateral ground-glass nodular densities primarily involving the left lower lobe most consistent with multi lobar pneumonia,  possibly atypical or viral in etiology. Aspiration is not excluded. Clinical correlation and follow-up to resolution recommended. There is no pleural effusion or pneumothorax. The central airways are patent. Upper Abdomen: No acute abnormality. Musculoskeletal: Degenerative changes of the spine. No acute osseous pathology. Left scapular fixation plate. IMPRESSION: Multi lobar pneumonia, possibly atypical or viral in etiology. Aspiration is not excluded. Clinical correlation and follow-up to resolution recommended. Electronically Signed   By: Elgie Collard M.D.   On: 04/13/2021 20:13   DG Chest Portable 1 View  Result Date: 04/13/2021 CLINICAL DATA:  Hypoxia EXAM: PORTABLE CHEST 1 VIEW COMPARISON:  09/28/2020 FINDINGS: Surgical plate and fixating screws at the left scapula. Left-sided dual lead pacing device with lead tips projecting over right atrium and cardiac apex. Slight increased loop in the right atrial lead compared to prior. Patchy atelectasis left base. Stable cardiomediastinal silhouette. No pneumothorax or pleural effusion. IMPRESSION: Minimal patchy atelectasis at the left base. Electronically Signed   By: Jasmine Pang M.D.   On: 04/13/2021 19:07   ASSESSMENT AND PLAN:  Alexander Kline. is a 80 y.o. 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 Patient states that he had a fever on the day of admission with a T-max of 101F and she administered Tylenol prior to his arrival to the ER.  Patient was noted to be tachypneic and was hypoxic on room air with pulse oximetry of 87% requiring oxygen supplementation at 2 L to improve pulse  oximetry to 95%.  Sepsis from community-acquired pneumonia rule out aspiration--POA --Patient presents to the ER for evaluation of productive cough, shortness of breath and fever with T-max of 101, tachypnea, leukocytosis and lactic acidosis --He is hypoxic and tachypneic with room air pulse oximetry of 87% and requires oxygen supplementation at 2 L to maintain pulse oximetry of 95%-- tolerated. -- on Rocephin and Zithromax -- received IV fluid resuscitation per sepsis protocol --Strict aspiration precautions --Speech therapy for swallow function evaluation recommends barium swallow study --Follow-up results of blood culture so far negative -- Pro calcitonin 6.99 -- lactic acid 2.0-- 1.7 -- white count 12.6--- 10.3 -- patient underwent modified barium swallow study shows silent aspiration. Follow speech therapy dietary recommendation dysphagia 3, nectar thick liquid   Acute metabolic encephalopathy Most likely secondary to acute illness --At baseline patient has a history of mild cognitive dysfunction but is usually oriented to person place and time -- patient's mentation is at baseline according to wife   Diabetes mellitus with complications of stage III chronic kidney disease --Hold metformin for now. Continue SSI --Blood sugar checks every 4 hours    Acute on Chronic diastolic dysfunction CHF --Continue enalapril and metoprolol --received IV lasix--now on po torsemide -BNP elevated    History of CVA with small VSD Continue apixaban     Procedures:MBSS Family communication : wife at bedside Consults : none CODE STATUS: full DVT Prophylaxis :  eliquis Level of care: Progressive Cardiac Status is: Inpatient  Remains inpatient appropriate because:Inpatient level of care appropriate due to severity of illness  Dispo: The patient is from: Home              Anticipated d/c is to: Home with [facility]              Patient currently is improving and hopefully should be ready for  discharge by tomorrow   Difficult to place patient No   TOTAL TIME TAKING CARE OF THIS PATIENT: 25 minutes.  >50% time spent on counselling and coordination of care  Note: This dictation was prepared with Dragon dictation along with smaller phrase technology. Any transcriptional errors that result from this process are unintentional.  Enedina Finner M.D    [facility]   CC: Primary care physician; Sherron Monday, MD Patient ID: Alexander Kline., male   DOB: 07-26-1941, 80 y.o.   MRN: [id number]

## 2021-04-15 NOTE — TOC Initial Note (Signed)
Transition of Care (TOC) - Initial/Assessment Note    Patient Details  Name: Alexander Kline. MRN: [id number] Date of Birth: Jul 25, 1941  Transition of Care [facility]) CM/SW Contact:    Hetty Ely, RN Phone Number: 04/15/2021, 1:04 PM  Clinical Narrative:  Spoke with patient, wife at bedside. Patient says wife does all cooking and shopping and he continues to drive to medical visits and pick up medications at [facility] in [place]. Lives in single family home with wife and Cat, use no assisted devices for ambulation. No home health services used or needed per patient and wife. TOC will continue to track for discharge needs.                 Expected Discharge Plan: Home/Self Care Barriers to Discharge: Continued Medical Work up   Patient Goals and CMS Choice Patient states their goals for this hospitalization and ongoing recovery are:: To return home with wife   Choice offered to / list presented to : NA  Expected Discharge Plan and Services Expected Discharge Plan: Home/Self Care   Discharge Planning Services: NA Post Acute Care Choice: NA Living arrangements for the past 2 months: Single Family Home                 DME Arranged: N/A DME Agency: NA       HH Arranged: NA HH Agency: NA        Prior Living Arrangements/Services Living arrangements for the past 2 months: Single Family Home Lives with:: Spouse Patient language and need for interpreter reviewed:: Yes Do you feel safe going back to the place where you live?: Yes      Need for Family Participation in Patient Care: No (Comment) Care giver support system in place?: Yes (comment)   Criminal Activity/Legal Involvement Pertinent to Current Situation/Hospitalization: No - Comment as needed  Activities of Daily Living Home Assistive Devices/Equipment: None ADL Screening (condition at time of admission) Patient's cognitive ability adequate to safely complete daily activities?: Yes Is the patient deaf or  have difficulty hearing?: No Does the patient have difficulty seeing, even when wearing glasses/contacts?: No Does the patient have difficulty concentrating, remembering, or making decisions?: Yes Patient able to express need for assistance with ADLs?: Yes Does the patient have difficulty dressing or bathing?: No Independently performs ADLs?: Yes (appropriate for developmental age) Does the patient have difficulty walking or climbing stairs?: No Weakness of Legs: Both Weakness of Arms/Hands: None  Permission Sought/Granted Permission sought to share information with : Case Manager Permission granted to share information with : No              Emotional Assessment Appearance:: Appears stated age Attitude/Demeanor/Rapport: Guarded Affect (typically observed): Flat Orientation: : Oriented to Self, Oriented to Place, Oriented to  Time, Oriented to Situation Alcohol / Substance Use: Not Applicable Psych Involvement: No (comment)  Admission diagnosis:  Acute respiratory failure (HCC) [J96.00] Acute on chronic diastolic congestive heart failure (HCC) [I50.33] Sepsis with acute hypoxic respiratory failure without septic shock, due to unspecified organism (HCC) [A41.9, R65.20, J96.01] Patient Active Problem List   Diagnosis Date Noted   Acute respiratory failure (HCC) 04/13/2021   CKD stage 3 due to type 2 diabetes mellitus (HCC) 04/13/2021   Acute metabolic encephalopathy 04/13/2021   Sepsis (HCC) 04/13/2021   CAP (community acquired pneumonia) 04/13/2021   Chronic diastolic CHF (congestive heart failure) (HCC) 04/13/2021   Bradycardia 09/25/2020   Fever 09/24/2020   Bradycardia, unspecified 09/24/2020  Pre-diabetes    Hypokalemia    Mild protein malnutrition (HCC)    Grade I diastolic dysfunction    Hyperlipidemia    Altered mental status    TIA (transient ischemic attack) 09/17/2020   Abnormal SPEP 06/23/2020   Primary hypertension 06/23/2020   Sinus bradycardia 06/22/2020    Premature ventricular contractions 06/22/2020   Heart palpitations 06/22/2020   Dizziness 06/22/2020   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 [place] [address] Phone: [phone] Fax: [phone]     Social Determinants of Health (SDOH) Interventions    Readmission Risk Interventions Readmission Risk Prevention Plan 04/15/2021  Transportation Screening Complete  PCP or Specialist Appt within 3-5 Days Complete  HRI or Home Care Consult Complete  Social Work Consult for Recovery Care Planning/Counseling Complete  Palliative Care Screening Not Applicable  Medication Review Oceanographer) Complete  Some recent data might be hidden

## 2021-04-15 NOTE — Progress Notes (Addendum)
  Speech Language Pathology Treatment: Dysphagia  Patient Details Name: Alexander Kline. MRN: [id number] DOB: 25-May-1941 Today's Date: 04/15/2021 Time: 1400-1430 SLP Time Calculation (min) (ACUTE ONLY): 30 min  Assessment / Plan / Recommendation Clinical Impression  Skilled treatment session focused on meeting with pt and his wife to discuss results of Modified Barium Swallow Study. Video of swallow with silent aspiration played for pt and his wife. Aspiration risk were provided along with strict aspiration precautions. Education provided on nectar thick liquids consistency as well as recommend for water protocol - pt may consume thin water after oral care, in between meals. Recommend Outpatient ST services to target pharyngeal strengthening with repeat instrumental study for possible liquid upgrade. All questions were answered to pt and wife's satisfaction.   Pt's voice was wet throughout MBSS and during this session.    HPI HPI: Pt is a 80 y.o. 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  CT of Chest: Multi lobar pneumonia, possibly atypical or viral in etiology. Aspiration is not excluded. Clinical correlation and follow-up to  resolution recommended.  DG Esophagus 12/2020: hiatal hernia. Mild B ring noted. Exam otherwise unremarkable. No obstructing abnormality.      SLP Plan  Continue with current plan of care       Recommendations  Diet recommendations: Dysphagia 3 (mechanical soft);Nectar-thick liquid (water protocol) Liquids provided via: Cup Medication Administration: Whole meds with puree Supervision: Patient able to  self feed Compensations: Minimize environmental distractions;Slow rate;Small sips/bites Postural Changes and/or Swallow Maneuvers: Out of bed for meals;Seated upright 90 degrees;Upright 30-60 min after meal                Oral Care Recommendations: Oral care BID Follow up Recommendations: Outpatient SLP;Home health SLP SLP Visit Diagnosis: Dysphagia, pharyngeal phase (R13.13) Plan: Continue with current plan of care       GO              Alexander Kline B. Alexander Kline M.S., CCC-SLP, [facility] Speech-Language Pathologist Rehabilitation Services Office [phone]   Alexander Kline 04/15/2021, 3:09 PM

## 2021-04-16 DIAGNOSIS — J189 Pneumonia, unspecified organism: Secondary | ICD-10-CM | POA: Diagnosis not present

## 2021-04-16 DIAGNOSIS — A419 Sepsis, unspecified organism: Secondary | ICD-10-CM | POA: Diagnosis not present

## 2021-04-16 LAB — GLUCOSE, CAPILLARY
Glucose-Capillary: 117 mg/dL — ABNORMAL HIGH (ref 70–99)
Glucose-Capillary: 126 mg/dL — ABNORMAL HIGH (ref 70–99)
Glucose-Capillary: 130 mg/dL — ABNORMAL HIGH (ref 70–99)
Glucose-Capillary: 171 mg/dL — ABNORMAL HIGH (ref 70–99)

## 2021-04-16 MED ORDER — STARCH (THICKENING) PO POWD: ORAL | 0 refills | Status: AC

## 2021-04-16 MED ORDER — METOPROLOL SUCCINATE ER 25 MG PO TB24: 12.5000 mg | ORAL_TABLET | Freq: Every day | ORAL | 0 refills | Status: DC

## 2021-04-16 MED ORDER — AMOXICILLIN-POT CLAVULANATE 875-125 MG PO TABS
1.0000 | ORAL_TABLET | Freq: Two times a day (BID) | ORAL | Status: DC
  Administered 2021-04-16: 1 via ORAL
  Filled 2021-04-16: qty 1

## 2021-04-16 MED ORDER — AMOXICILLIN-POT CLAVULANATE 875-125 MG PO TABS: 1.0000 | ORAL_TABLET | Freq: Two times a day (BID) | ORAL | 0 refills | Status: DC

## 2021-04-16 NOTE — Discharge Summary (Signed)
Physician Discharge Summary  Alexander Kline. ZOX:[id number] DOB: 21-Aug-1941 DOA: 04/13/2021  PCP: Sherron Monday, MD  Admit date: 04/13/2021 Discharge date: 04/16/2021  Admitted From: home Discharge disposition: home   Recommendations for Outpatient Follow-Up:   Finish abx BMP 1 week Daily weights- adjust diuretics Home health- PT/SLP   Discharge Diagnosis:   Principal Problem:   Sepsis (HCC) Active Problems:   Acute respiratory failure (HCC)   CKD stage 3 due to type 2 diabetes mellitus (HCC)   Acute metabolic encephalopathy   CAP (community acquired pneumonia)   Chronic diastolic CHF (congestive heart failure) (HCC)    Discharge Condition: Improved.  Diet recommendation: DYS nectar thick  Wound care: None.  Code status: Full.   History of Present Illness:   Alexander Kline. is a 80 y.o. 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 Patient states that he had a fever on the day of admission with a T-max of 101F and she administered Tylenol prior to his arrival to the ER.  Patient was noted to be tachypneic and was hypoxic on room air with pulse oximetry of 87% requiring oxygen supplementation at 2 L to improve pulse oximetry to 95%.  He is only oriented to person and place and at his baseline, he is usually awake, alert and oriented to person, place and time. According to the wife his cough is productive but she is unable to tell me what color the phlegm is. He received 2 doses of the COVID-19 vaccine and a booster dose as well.  He had a home COVID-19 test which was negative. I am unable to do  a review of systems on this patient due to his mental status. Labs show sodium 136, potassium 4.1, chloride 104, bicarb 23, glucose 155, BUN 25, creatinine 1.46, calcium 8.8, magnesium 2.0, alkaline phosphatase 71, albumin 4.0, AST 38, ALT 24, total protein 7.6, BNP 882, troponin 65, lactic acid 2.0, white count 12.6, hemoglobin 13.4, hematocrit 39.6, MCV 92.7, RDW 13.2, platelet count 168 Respiratory viral panel is negative Chest x-ray reviewed by me shows infiltrate at the left base. CT scan of the chest without contrast shows multi lobar pneumonia, possibly atypical or viral in etiology. Aspiration is not excluded. Clinical correlation and follow-up to resolution recommended.  Twelve-lead EKG reviewed by me shows shows an irregular rhythm most likely A. fib     Hospital Course by Problem:   Sepsis from community-acquired pneumonia rule out aspiration--POA --Patient presents to the ER for evaluation of productive cough, shortness of breath and fever with T-max of 101, tachypnea, leukocytosis and lactic acidosis -on room air -- Rocephin and Zithromax- change to PO abx --Strict aspiration precautions -- patient underwent modified barium swallow study shows silent aspiration. Follow speech therapy dietary recommendation    Acute metabolic encephalopathy Most likely secondary to acute illness --At baseline patient has a history of mild cognitive dysfunction but is usually oriented to person place and time -- patient's mentation is at baseline according to wife   Diabetes mellitus with complications of stage III chronic kidney disease --Hold metformin for now.  - diet controlled for now    Acute  on Chronic diastolic dysfunction CHF --Continue enalapril and metoprolol --received IV lasix--now on po torsemide- adjust as an outpatient -BNP elevated    History of CVA with small VSD Continue apixaban    Medical Consultants:      Discharge Exam:   Vitals:   04/16/21 0754 04/16/21  1154  BP: 103/77 129/71  Pulse: 78 77  Resp: 18 17  Temp: 97.7 F (36.5 C) 99.3 F (37.4 C)  SpO2: 95% 99%   Vitals:   04/16/21 0108 04/16/21 0522 04/16/21 0754 04/16/21 1154  BP: 127/82 121/84 103/77 129/71  Pulse: 85 78 78 77  Resp: 18 20 18 17   Temp: 98.8 F (37.1 C) 98.7 F (37.1 C) 97.7 F (36.5 C) 99.3 F (37.4 C)  TempSrc: Oral Oral Oral   SpO2: 95% 93% 95% 99%  Weight:      Height:        General exam: Appears calm and comfortable. Anxious to go home   The results of significant diagnostics from this hospitalization (including imaging, microbiology, ancillary and laboratory) are listed below for reference.     Procedures and Diagnostic Studies:   CT CHEST WO CONTRAST  Result Date: 04/13/2021 CLINICAL DATA:  80 year old male with pneumonia. EXAM: CT CHEST WITHOUT CONTRAST TECHNIQUE: Multidetector CT imaging of the chest was performed following the standard protocol without IV contrast. COMPARISON:  Chest radiograph dated 04/13/2021 and CT dated 09/30/2017. FINDINGS: Evaluation of this exam is limited due to respiratory motion artifact as well as in the absence of intravenous contrast. Cardiovascular: Mild cardiomegaly. No pericardial effusion. There is coronary vascular calcification. The thoracic aorta and central pulmonary arteries are grossly unremarkable on this noncontrast CT. Left pectoral pacemaker device noted. Mediastinum/Nodes: Top-normal mediastinal lymph nodes measure 10 mm in short axis. The esophagus is grossly unremarkable. No mediastinal fluid collection. Lungs/Pleura: Scattered bilateral ground-glass nodular densities primarily involving the left lower lobe most consistent with multi lobar pneumonia, possibly atypical or viral in etiology. Aspiration is not excluded. Clinical correlation and follow-up to resolution recommended. There is no pleural effusion or pneumothorax. The central airways are patent. Upper Abdomen: No acute abnormality. Musculoskeletal:  Degenerative changes of the spine. No acute osseous pathology. Left scapular fixation plate. IMPRESSION: Multi lobar pneumonia, possibly atypical or viral in etiology. Aspiration is not excluded. Clinical correlation and follow-up to resolution recommended. Electronically Signed   By: 14/11/2016 M.D.   On: 04/13/2021 20:13   DG Chest Portable 1 View  Result Date: 04/13/2021 CLINICAL DATA:  Hypoxia EXAM: PORTABLE CHEST 1 VIEW COMPARISON:  09/28/2020 FINDINGS: Surgical plate and fixating screws at the left scapula. Left-sided dual lead pacing device with lead tips projecting over right atrium and cardiac apex. Slight increased loop in the right atrial lead compared to prior. Patchy atelectasis left base. Stable cardiomediastinal silhouette. No pneumothorax or pleural effusion. IMPRESSION: Minimal patchy atelectasis at the left base. Electronically Signed   By: 09/30/2020 M.D.   On: 04/13/2021 19:07     Labs:   Basic Metabolic Panel: Recent Labs  Lab 04/13/21 1833 04/14/21 0520  NA 136 137  K 4.1 4.6  CL 104 102  CO2 23 29  GLUCOSE 155* 141*  BUN 25* 27*  CREATININE 1.46* 1.57*  CALCIUM 8.8* 8.6*  MG 2.0  --    GFR Estimated Creatinine Clearance: 46.6 mL/min (A) (by C-G formula based on SCr of 1.57 mg/dL (H)). Liver Function Tests: Recent Labs  Lab 04/13/21 1833  AST 38  ALT 24  ALKPHOS 71  BILITOT 1.0  PROT 7.6  ALBUMIN 4.0   No results for input(s): LIPASE, AMYLASE in the last 168 hours. No results for input(s): AMMONIA in the last 168 hours. Coagulation profile Recent Labs  Lab 04/14/21 0520  INR 1.4*    CBC: Recent Labs  Lab 04/13/21 1833 04/14/21 0520  WBC 12.6* 10.3  NEUTROABS 10.1*  --   HGB 13.4 12.8*  HCT 39.6 36.7*  MCV 92.7 90.4  PLT 168 143*   Cardiac Enzymes: No results for input(s): CKTOTAL, CKMB, CKMBINDEX, TROPONINI in the last 168 hours. BNP: Invalid input(s): POCBNP CBG: Recent Labs  Lab 04/15/21 2020 04/16/21 0012  04/16/21 0424 04/16/21 0753 04/16/21 1153  GLUCAP 118* 117* 130* 126* 171*   D-Dimer No results for input(s): DDIMER in the last 72 hours. Hgb A1c No results for input(s): HGBA1C in the last 72 hours. Lipid Profile No results for input(s): CHOL, HDL, LDLCALC, TRIG, CHOLHDL, LDLDIRECT in the last 72 hours. Thyroid function studies No results for input(s): TSH, T4TOTAL, T3FREE, THYROIDAB in the last 72 hours.  Invalid input(s): FREET3 Anemia work up No results for input(s): VITAMINB12, FOLATE, FERRITIN, TIBC, IRON, RETICCTPCT in the last 72 hours. Microbiology Recent Results (from the past 240 hour(s))  Resp Panel by RT-PCR (Flu A&B, Covid) Nasopharyngeal Swab     Status: None   Collection Time: 04/13/21  6:33 PM   Specimen: Nasopharyngeal Swab; Nasopharyngeal(NP) swabs in vial transport medium  Result Value Ref Range Status   SARS Coronavirus 2 by RT PCR NEGATIVE NEGATIVE Final    Comment: (NOTE) SARS-CoV-2 target nucleic acids are NOT DETECTED.  The SARS-CoV-2 RNA is generally detectable in upper respiratory specimens during the acute phase of infection. The lowest concentration of SARS-CoV-2 viral copies this assay can detect is 138 copies/mL. A negative result does not preclude SARS-Cov-2 infection and should not be used as the sole basis for treatment or other patient management decisions. A negative result may occur with  improper specimen collection/handling, submission of specimen other than nasopharyngeal swab, presence of viral mutation(s) within the areas targeted by this assay, and inadequate number of viral copies(<138 copies/mL). A negative result must be combined with clinical observations, patient history, and epidemiological information. The expected result is Negative.  Fact Sheet for Patients:  BloggerCourse.com  Fact Sheet for Healthcare Providers:  SeriousBroker.it  This test is no t yet approved or  cleared by the [place] FDA and  has been authorized for detection and/or diagnosis of SARS-CoV-2 by FDA under an Emergency Use Authorization (EUA). This EUA will remain  in effect (meaning this test can be used) for the duration of the COVID-19 declaration under Section 564(b)(1) of the Act, 21 U.S.C.section 360bbb-3(b)(1), unless the authorization is terminated  or revoked sooner.       Influenza A by PCR NEGATIVE NEGATIVE Final   Influenza B by PCR NEGATIVE NEGATIVE Final    Comment: (NOTE) The Xpert Xpress SARS-CoV-2/FLU/RSV plus assay is intended as an aid in the diagnosis of influenza from Nasopharyngeal swab specimens and should not be used as a sole basis for treatment. Nasal washings and aspirates are unacceptable for Xpert Xpress SARS-CoV-2/FLU/RSV testing.  Fact Sheet for Patients: BloggerCourse.com  Fact Sheet for Healthcare Providers: SeriousBroker.it  This test is not yet approved or cleared by the [place] FDA and has been authorized for detection and/or diagnosis of SARS-CoV-2 by FDA under an Emergency Use Authorization (EUA). This EUA will remain in effect (meaning this test can  be used) for the duration of the COVID-19 declaration under Section 564(b)(1) of the Act, 21 U.S.C. section 360bbb-3(b)(1), unless the authorization is terminated or revoked.  Performed at [facility], [address] [place], [place] [address]   Blood culture (routine x 2)     Status: None (Preliminary result)   Collection Time: 04/13/21  6:33 PM   Specimen: BLOOD  Result Value Ref Range Status   Specimen Description BLOOD RIGHT ANTECUBITAL  Final   Special Requests   Final    BOTTLES DRAWN AEROBIC AND ANAEROBIC Blood Culture adequate volume   Culture   Final    NO GROWTH 3 DAYS Performed at [facility], [address] [place], [place] [address]    Report Status PENDING  Incomplete  Blood culture  (routine x 2)     Status: None (Preliminary result)   Collection Time: 04/13/21  6:33 PM   Specimen: BLOOD  Result Value Ref Range Status   Specimen Description   Final    BLOOD LEFT ANTECUBITAL Performed at [facility], [address], [place], [place] [address]    Special Requests   Final    BOTTLES DRAWN AEROBIC AND ANAEROBIC Blood Culture adequate volume Performed at [facility], [address], [place], [place] [address]    Culture  Setup Time   Final    GRAM POSITIVE COCCI AEROBIC BOTTLE ONLY CRITICAL RESULT CALLED TO, READ BACK BY AND VERIFIED WITH: CARISSA DOLAN  ON 04/14/21 SKL Performed at [facility], [address] [place], [place] [address]    Culture GRAM POSITIVE COCCI  Final   Report Status PENDING  Incomplete  Blood Culture ID Panel (Reflexed)     Status: Abnormal   Collection Time: 04/13/21  6:33 PM  Result Value Ref Range Status   Enterococcus faecalis NOT DETECTED NOT DETECTED Final   Enterococcus Faecium NOT DETECTED NOT DETECTED Final   Listeria monocytogenes NOT DETECTED NOT DETECTED Final   Staphylococcus species DETECTED (A) NOT DETECTED Final    Comment: CRITICAL RESULT CALLED TO, READ BACK BY AND VERIFIED WITH: CARISSA DOLAN  ON 04/14/21 SKL    Staphylococcus aureus (BCID) NOT DETECTED NOT DETECTED Final   Staphylococcus epidermidis DETECTED (A) NOT DETECTED Final    Comment: CRITICAL RESULT CALLED TO, READ BACK BY AND VERIFIED WITH: CARISSA DOLAN  ON 04/14/21 SKL    Staphylococcus lugdunensis NOT DETECTED NOT DETECTED Final   Streptococcus species NOT DETECTED NOT DETECTED Final   Streptococcus agalactiae NOT DETECTED NOT DETECTED Final   Streptococcus pneumoniae NOT DETECTED NOT DETECTED Final   Streptococcus pyogenes NOT DETECTED NOT DETECTED Final   A.calcoaceticus-baumannii NOT DETECTED NOT DETECTED Final   Bacteroides fragilis NOT DETECTED NOT DETECTED Final   Enterobacterales NOT DETECTED NOT  DETECTED Final   Enterobacter cloacae complex NOT DETECTED NOT DETECTED Final   Escherichia coli NOT DETECTED NOT DETECTED Final   Klebsiella aerogenes NOT DETECTED NOT DETECTED Final   Klebsiella oxytoca NOT DETECTED NOT DETECTED Final   Klebsiella pneumoniae NOT DETECTED NOT DETECTED Final   Proteus species NOT DETECTED NOT DETECTED Final   Salmonella species NOT DETECTED NOT DETECTED Final   Serratia marcescens NOT DETECTED NOT DETECTED Final   Haemophilus influenzae NOT DETECTED NOT DETECTED Final   Neisseria meningitidis NOT DETECTED NOT DETECTED Final   Pseudomonas aeruginosa NOT DETECTED NOT DETECTED Final   Stenotrophomonas maltophilia NOT DETECTED NOT DETECTED Final   Candida albicans NOT DETECTED NOT DETECTED Final  Candida auris NOT DETECTED NOT DETECTED Final   Candida glabrata NOT DETECTED NOT DETECTED Final   Candida krusei NOT DETECTED NOT DETECTED Final   Candida parapsilosis NOT DETECTED NOT DETECTED Final   Candida tropicalis NOT DETECTED NOT DETECTED Final   Cryptococcus neoformans/gattii NOT DETECTED NOT DETECTED Final   Methicillin resistance mecA/C NOT DETECTED NOT DETECTED Final    Comment: Performed at [facility], [address] [place], [place] [address]  Culture, blood (single) w Reflex to ID Panel     Status: None (Preliminary result)   Collection Time: 04/15/21  9:50 AM   Specimen: BLOOD  Result Value Ref Range Status   Specimen Description BLOOD LEFT ANTECUBITAL  Final   Special Requests   Final    BOTTLES DRAWN AEROBIC AND ANAEROBIC Blood Culture adequate volume   Culture   Final    NO GROWTH < 24 HOURS Performed at [facility], [address], [place], [place] [address]    Report Status PENDING  Incomplete     Discharge Instructions:   Discharge Instructions     (HEART FAILURE PATIENTS) Call MD:  Anytime you have any of the following symptoms: 1) 3 pound weight gain in 24 hours or 5 pounds in 1 week 2) shortness of  breath, with or without a dry hacking cough 3) swelling in the hands, feet or stomach 4) if you have to sleep on extra pillows at night in order to breathe.   Complete by: As directed    Discharge instructions   Complete by: As directed    DYS 3 nectar thick diet   Increase activity slowly   Complete by: As directed       Allergies as of 04/16/2021       Reactions   Sulfa Antibiotics    Per pt, he gets bad headaches with sulfa drugs        Medication List     STOP taking these medications    Apoaequorin 10 MG Caps   Coconut Oil 1000 MG Caps   metFORMIN 500 MG 24 hr tablet Commonly known as: GLUCOPHAGE-XR       TAKE these medications    amoxicillin-clavulanate 875-125 MG tablet Commonly known as: AUGMENTIN Take 1 tablet by mouth every 12 (twelve) hours.   Cholecalciferol 25 MCG (1000 UT) tablet Take 3,000 Units by mouth daily.   CoQ-10 100 MG Caps Take 100 mg by mouth every evening.   D-MANNOSE PO Take 600 mg by mouth in the morning and at bedtime.   Eliquis 5 MG Tabs tablet Generic drug: apixaban Take 5 mg by mouth 2 (two) times daily.   ferrous gluconate 240 (27 FE) MG tablet Commonly known as: FERGON Take 240 mg by mouth daily.   fluticasone 50 MCG/ACT nasal spray Commonly known as: FLONASE Place 1-2 sprays into both nostrils daily as needed (allergies.).   food thickener Powd Commonly known as: THICK IT Thicken to nectar thick liquids   metoprolol succinate 25 MG 24 hr tablet Commonly known as: TOPROL-XL Take 0.5 tablets (12.5 mg total) by mouth daily with breakfast. Start taking on: April 17, 2021 What changed:  medication strength how much to take when to take this additional instructions Another medication with the same name was removed. Continue taking this medication, and follow the directions you see here.   midodrine 2.5 MG tablet Commonly known as: PROAMATINE Take 2.5 mg by mouth 3 (three) times daily.   multivitamin with  minerals Tabs tablet  Take 1 tablet by mouth every evening.   omeprazole 20 MG tablet Commonly known as: PRILOSEC OTC Take 20 mg by mouth every evening.   rivastigmine 1.5 MG capsule Commonly known as: EXELON Take 1.5-3 mg by mouth See admin instructions. Take 1 capsule (1.5mg ) by mouth every morning and take 2 capsules (3mg ) by mouth every night   rosuvastatin 20 MG tablet Commonly known as: CRESTOR Take 20 mg by mouth daily.   torsemide 20 MG tablet Commonly known as: DEMADEX Take 20 mg by mouth every other day.   VITAMIN B COMPLEX PO Take 1 tablet by mouth daily.          Time coordinating discharge: 35 min  Signed:  Joseph ArtJessica U Bradlee Heitman DO  [facility] 04/16/2021, 12:08 PM

## 2021-04-16 NOTE — Progress Notes (Signed)
Patient ambulated on room air and O2 sats remained above 95% without supplemental oxygen.

## 2021-04-16 NOTE — TOC Transition Note (Addendum)
Transition of Care [facility]) - CM/SW Discharge Note   Patient Details  Name: Alexander Kline. MRN: [id number] Date of Birth: [birth year]  [facility] CM/SW Contact:  Bing Quarry, RN Phone Number: 04/16/2021, 1:36 PM   Clinical Narrative: Patient to be discharged today to Home/Self Care. Patient declined [facility] services via prior CM notes--that wife does cooking and shopping. No prior HH services used or needed, no assistive devices. Will clarify for discharge today. Gabriel Cirri RN CM   144 pm Update: Spoke with patient on phone in room and he verbally declined the need/want for [facility] services including PT/OT/SLP per orders. Gabriel Cirri RN CM   Final next level of care: Home/Self Care Barriers to Discharge: Barriers Resolved   Patient Goals and CMS Choice Patient states their goals for this hospitalization and ongoing recovery are:: To return home with wife   Choice offered to / list presented to : NA  Discharge Placement                       Discharge Plan and Services   Discharge Planning Services: NA Post Acute Care Choice: NA          DME Arranged: N/A DME Agency: NA       HH Arranged: NA HH Agency: NA (Patient declined need for [facility] services/equipment. See CM notes.)        Social Determinants of Health (SDOH) Interventions     Readmission Risk Interventions Readmission Risk Prevention Plan 04/15/2021  Transportation Screening Complete  PCP or Specialist Appt within 3-5 Days Complete  HRI or Home Care Consult Complete  Social Work Consult for Recovery Care Planning/Counseling Complete  Palliative Care Screening Not Applicable  Medication Review [employer]) Complete  Some recent data might be hidden

## 2021-04-17 LAB — CULTURE, BLOOD (ROUTINE X 2): Special Requests: ADEQUATE

## 2021-04-18 LAB — CULTURE, BLOOD (ROUTINE X 2)
Culture: NO GROWTH
Special Requests: ADEQUATE

## 2021-04-20 ENCOUNTER — Ambulatory Visit (HOSPITAL_COMMUNITY)
Admission: RE | Admit: 2021-04-20 | Discharge: 2021-04-20 | Disposition: A | Discharge status: Home / Self Care | Payer: Medicare Other | Source: Ambulatory Visit | Attending: Neurology | Admitting: Neurology

## 2021-04-20 ENCOUNTER — Other Ambulatory Visit: Payer: Self-pay

## 2021-04-20 DIAGNOSIS — M79651 Pain in right thigh: Secondary | ICD-10-CM | POA: Diagnosis present

## 2021-04-20 DIAGNOSIS — G3184 Mild cognitive impairment, so stated: Secondary | ICD-10-CM | POA: Diagnosis present

## 2021-04-20 LAB — CULTURE, BLOOD (SINGLE)
Culture: NO GROWTH
Special Requests: ADEQUATE

## 2021-04-20 NOTE — Progress Notes (Signed)
Per order,  Changed device settings for MRI to DOO at 90 bpm Will program device back to pre-MRI settings after completion of exam, and send transmission.         

## 2021-08-04 ENCOUNTER — Ambulatory Visit: Payer: Medicare Other | Attending: Internal Medicine | Admitting: Speech Pathology

## 2021-08-04 ENCOUNTER — Other Ambulatory Visit: Payer: Self-pay

## 2021-08-04 DIAGNOSIS — R1313 Dysphagia, pharyngeal phase: Secondary | ICD-10-CM | POA: Diagnosis not present

## 2021-08-06 ENCOUNTER — Encounter: Payer: Self-pay | Admitting: Speech Pathology

## 2021-08-06 NOTE — Therapy (Signed)
[facility] MAIN [facility] SERVICES [address] [place], [place], [address] Phone: [phone]   Fax:  [phone]  Speech Language Pathology Evaluation  Patient Details  Name: Alexander Kline. MRN: [id number] Date of Birth: 27-Feb-1941 Referring Provider (SLP): Enid Baas   Encounter Date: 08/04/2021   End of Session - 08/06/21 1224     Visit Number 1    Number of Visits 1    Authorization Type [employer]    SLP Start Time 1400    SLP Stop Time  1445    SLP Time Calculation (min) 45 min    Activity Tolerance Patient tolerated treatment well             Past Medical History:  Diagnosis Date   Grade I diastolic dysfunction    Hyperlipidemia    Hypertension    Pre-diabetes    Stroke [facility])     Past Surgical History:  Procedure Laterality Date   COLONOSCOPY WITH PROPOFOL N/A 01/14/2020   Procedure: COLONOSCOPY WITH PROPOFOL;  Surgeon: Toledo, Boykin Nearing, MD;  Location: [facility] ENDOSCOPY;  Service: Gastroenterology;  Laterality: N/A;   ESOPHAGOGASTRODUODENOSCOPY (EGD) WITH PROPOFOL N/A 01/14/2020   Procedure: ESOPHAGOGASTRODUODENOSCOPY (EGD) WITH PROPOFOL;  Surgeon: Toledo, Boykin Nearing, MD;  Location: [facility] ENDOSCOPY;  Service: Gastroenterology;  Laterality: N/A;   HIP ARTHROPLASTY Right 09/29/2017   Procedure: ARTHROPLASTY BIPOLAR HIP (HEMIARTHROPLASTY);  Surgeon: Deeann Saint, MD;  Location: [facility] ORS;  Service: Orthopedics;  Laterality: Right;   JOINT REPLACEMENT     PACEMAKER IMPLANT N/A 09/28/2020   Procedure: PACEMAKER IMPLANT;  Surgeon: Marcina Millard, MD;  Location: [facility] INVASIVE CV LAB;  Service: Cardiovascular;  Laterality: N/A;   TEE WITHOUT CARDIOVERSION N/A 07/26/2018   Procedure: TRANSESOPHAGEAL ECHOCARDIOGRAM (TEE);  Surgeon: Laurier Nancy, MD;  Location: [facility] ORS;  Service: Cardiovascular;  Laterality: N/A;    There were no vitals filed for this visit.   Subjective Assessment - 08/06/21 1219      Subjective "I am surprised you remembered [place]" accompanied by his daughter    Patient is accompained by: Family member    Currently in Pain? No/denies                SLP Evaluation [facility] - 08/06/21 1219       SLP Visit Information   SLP Received On 08/05/21    Referring Provider (SLP) Enid Baas    Onset Date 04/15/2021    Medical Diagnosis Pharyngeal Dysphagia      Subjective   Patient/Family Stated Goal to reduce risk of developing aspiration pneumonia      General Information   HPI Pt is a 80 year old male who was recently hospitalized for pneumonia (04/15/2021), MBSS revealed intermittent silent aspiration of thin liquids, recommended Outpatient ST services to target pharyngeal strengthening.    Behavioral/Cognition appropriate    Mobility Status ambulatory      Balance Screen   Has the patient fallen in the past 6 months No    Has the patient had a decrease in activity level because of a fear of falling?  No    Is the patient reluctant to leave their home because of a fear of falling?  No      Prior Functional Status   Cognitive/Linguistic Baseline Baseline deficits    Baseline deficit details mild memory deficits, but pt continues to good historian      Oral Motor/Sensory Function   Overall Oral Motor/Sensory Function Appears within functional limits  for tasks assessed      Motor Speech   Overall Motor Speech Appears within functional limits for tasks assessed                             SLP Education - 08/06/21 1223     Education Details aspiration precautions, s/s of aspiration, s/s of aspiration pneumonia, pulmonary hygiene    Person(s) Educated Patient;Child(ren)    Methods Explanation;Demonstration;Verbal cues    Comprehension Verbalized understanding;Returned demonstration                  Plan - 08/06/21 1224     Clinical Impression Statement Pt presents for follow up of pharyngeal dysphagia documented during  hospitalization in 03/2021. MBSS imaging reviewed with pt and his daughter. Silent aspiration of thin liquids is intermittent and mild. Additionally information was garnered from pt and his daughter about factors that may have resulted in pt's hospitalization such a fluid overload and decreased physical activity. Since discharge from hospitalization pt has been drinking thin liquids and he has also increased his physical activity in addition to his fluid overload being resolved. Extensive and through education was provided to pt and his daughter on aspiration precautions when drinking thin liquids, s/s of respiratory decline, importance of staying active to reduce risk of aspiration pneumonia. Pt and his daughter voice understanding of this information. At this time, pt's risk of aspiration is considered minimal and he appears appropriate to continue consuming thin liquids. Will re-visit dysphagia therapy should pt be admitted again for pneumonia. Pt and his daughter voiced agreement with this plan. No further services are indicated at this time.    Consulted and Agree with Plan of Care Patient;Family member/caregiver    Family Member Consulted pt's daughter              Problem List Patient Active Problem List   Diagnosis Date Noted   Acute respiratory failure (HCC) 04/13/2021   CKD stage 3 due to type 2 diabetes mellitus (HCC) 04/13/2021   Acute metabolic encephalopathy 04/13/2021   Sepsis (HCC) 04/13/2021   CAP (community acquired pneumonia) 04/13/2021   Chronic diastolic CHF (congestive heart failure) (HCC) 04/13/2021   Bradycardia 09/25/2020   Fever 09/24/2020   Bradycardia, unspecified 09/24/2020   Pre-diabetes    Hypokalemia    Mild protein malnutrition (HCC)    Grade I diastolic dysfunction    Hyperlipidemia    Altered mental status    TIA (transient ischemic attack) 09/17/2020   Abnormal SPEP 06/23/2020   Primary hypertension 06/23/2020   Sinus bradycardia 06/22/2020    Premature ventricular contractions 06/22/2020   Heart palpitations 06/22/2020   Dizziness 06/22/2020   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 Dreama Saa M.S., CCC-SLP, [facility] Speech-Language Pathologist Rehabilitation Services Office [phone]  [name] 08/06/2021, 12:30 PM  [facility] MAIN [facility] SERVICES [address] [place], [place], [address] Phone: [phone]   Fax:  [phone]  Name: Alexander Kline. MRN: [id number] Date of Birth: 09-16-1941

## 2021-08-06 NOTE — Patient Instructions (Signed)
Follow strict aspiration precautions Continue to be physically active

## 2024-10-13 ENCOUNTER — Other Ambulatory Visit (HOSPITAL_COMMUNITY): Payer: Self-pay | Admitting: Family Medicine

## 2024-10-13 DIAGNOSIS — M5412 Radiculopathy, cervical region: Secondary | ICD-10-CM

## 2024-11-22 ENCOUNTER — Inpatient Hospital Stay: Admit: 2024-11-22 | Discharge: 2024-11-22 | Disposition: A | Attending: Internal Medicine

## 2024-11-22 ENCOUNTER — Observation Stay
Admission: EM | Admit: 2024-11-22 | Discharge: 2024-11-26 | Disposition: A | Discharge status: Skilled Nursing Facility | Attending: Student | Admitting: Student

## 2024-11-22 ENCOUNTER — Other Ambulatory Visit: Payer: Self-pay

## 2024-11-22 ENCOUNTER — Emergency Department

## 2024-11-22 DIAGNOSIS — E669 Obesity, unspecified: Secondary | ICD-10-CM | POA: Insufficient documentation

## 2024-11-22 DIAGNOSIS — N183 Chronic kidney disease, stage 3 unspecified: Secondary | ICD-10-CM | POA: Insufficient documentation

## 2024-11-22 DIAGNOSIS — Z8673 Personal history of transient ischemic attack (TIA), and cerebral infarction without residual deficits: Secondary | ICD-10-CM | POA: Insufficient documentation

## 2024-11-22 DIAGNOSIS — R531 Weakness: Secondary | ICD-10-CM | POA: Diagnosis present

## 2024-11-22 DIAGNOSIS — R5381 Other malaise: Secondary | ICD-10-CM | POA: Insufficient documentation

## 2024-11-22 DIAGNOSIS — W2203XA Walked into furniture, initial encounter: Secondary | ICD-10-CM | POA: Insufficient documentation

## 2024-11-22 DIAGNOSIS — M6281 Muscle weakness (generalized): Secondary | ICD-10-CM | POA: Insufficient documentation

## 2024-11-22 DIAGNOSIS — I130 Hypertensive heart and chronic kidney disease with heart failure and stage 1 through stage 4 chronic kidney disease, or unspecified chronic kidney disease: Secondary | ICD-10-CM | POA: Diagnosis not present

## 2024-11-22 DIAGNOSIS — E1122 Type 2 diabetes mellitus with diabetic chronic kidney disease: Secondary | ICD-10-CM | POA: Diagnosis not present

## 2024-11-22 DIAGNOSIS — W19XXXA Unspecified fall, initial encounter: Secondary | ICD-10-CM | POA: Diagnosis not present

## 2024-11-22 DIAGNOSIS — R7989 Other specified abnormal findings of blood chemistry: Secondary | ICD-10-CM | POA: Diagnosis not present

## 2024-11-22 DIAGNOSIS — I5032 Chronic diastolic (congestive) heart failure: Secondary | ICD-10-CM | POA: Insufficient documentation

## 2024-11-22 DIAGNOSIS — Z950 Presence of cardiac pacemaker: Secondary | ICD-10-CM | POA: Diagnosis not present

## 2024-11-22 DIAGNOSIS — G8929 Other chronic pain: Secondary | ICD-10-CM | POA: Diagnosis not present

## 2024-11-22 DIAGNOSIS — G4733 Obstructive sleep apnea (adult) (pediatric): Secondary | ICD-10-CM | POA: Insufficient documentation

## 2024-11-22 DIAGNOSIS — Z79899 Other long term (current) drug therapy: Secondary | ICD-10-CM | POA: Insufficient documentation

## 2024-11-22 DIAGNOSIS — M545 Low back pain, unspecified: Secondary | ICD-10-CM | POA: Diagnosis not present

## 2024-11-22 DIAGNOSIS — I959 Hypotension, unspecified: Secondary | ICD-10-CM | POA: Diagnosis not present

## 2024-11-22 DIAGNOSIS — Z7901 Long term (current) use of anticoagulants: Secondary | ICD-10-CM | POA: Insufficient documentation

## 2024-11-22 DIAGNOSIS — W1830XA Fall on same level, unspecified, initial encounter: Secondary | ICD-10-CM | POA: Insufficient documentation

## 2024-11-22 LAB — URINALYSIS, W/ REFLEX TO CULTURE (INFECTION SUSPECTED)
Bacteria, UA: NONE SEEN
Bilirubin Urine: NEGATIVE
Glucose, UA: NEGATIVE mg/dL
Ketones, ur: NEGATIVE mg/dL
Leukocytes,Ua: NEGATIVE
Nitrite: NEGATIVE
Protein, ur: 30 mg/dL — AB
Specific Gravity, Urine: 1.026 (ref 1.005–1.030)
Squamous Epithelial / HPF: 0 /HPF (ref 0–5)
pH: 5 (ref 5.0–8.0)

## 2024-11-22 LAB — CK: Total CK: 137 U/L (ref 49–397)

## 2024-11-22 LAB — CBC WITH DIFFERENTIAL/PLATELET
Abs Immature Granulocytes: 0.03 10*3/uL (ref 0.00–0.07)
Basophils Absolute: 0.1 10*3/uL (ref 0.0–0.1)
Basophils Relative: 1 %
Eosinophils Absolute: 0.4 10*3/uL (ref 0.0–0.5)
Eosinophils Relative: 5 %
HCT: 42.3 % (ref 39.0–52.0)
Hemoglobin: 14.4 g/dL (ref 13.0–17.0)
Immature Granulocytes: 0 %
Lymphocytes Relative: 24 %
Lymphs Abs: 1.9 10*3/uL (ref 0.7–4.0)
MCH: 30.8 pg (ref 26.0–34.0)
MCHC: 34 g/dL (ref 30.0–36.0)
MCV: 90.4 fL (ref 80.0–100.0)
Monocytes Absolute: 0.6 10*3/uL (ref 0.1–1.0)
Monocytes Relative: 7 %
Neutro Abs: 5 10*3/uL (ref 1.7–7.7)
Neutrophils Relative %: 63 %
Platelets: 181 10*3/uL (ref 150–400)
RBC: 4.68 MIL/uL (ref 4.22–5.81)
RDW: 14.3 % (ref 11.5–15.5)
WBC: 7.9 10*3/uL (ref 4.0–10.5)
nRBC: 0 % (ref 0.0–0.2)

## 2024-11-22 LAB — ECHOCARDIOGRAM COMPLETE
Area-P 1/2: 6.12 cm2
Calc EF: 38.5 %
S' Lateral: 3.7 cm
Single Plane A2C EF: 37.6 %
Single Plane A4C EF: 41.6 %

## 2024-11-22 LAB — COMPREHENSIVE METABOLIC PANEL WITH GFR
ALT: 23 U/L (ref 0–44)
AST: 35 U/L (ref 15–41)
Albumin: 3.7 g/dL (ref 3.5–5.0)
Alkaline Phosphatase: 91 U/L (ref 38–126)
Anion gap: 10 (ref 5–15)
BUN: 19 mg/dL (ref 8–23)
CO2: 26 mmol/L (ref 22–32)
Calcium: 9.7 mg/dL (ref 8.9–10.3)
Chloride: 106 mmol/L (ref 98–111)
Creatinine, Ser: 1.09 mg/dL (ref 0.61–1.24)
GFR, Estimated: >60 mL/min
Glucose, Bld: 120 mg/dL — ABNORMAL HIGH (ref 70–99)
Potassium: 3.8 mmol/L (ref 3.5–5.1)
Sodium: 142 mmol/L (ref 135–145)
Total Bilirubin: 0.5 mg/dL (ref 0.0–1.2)
Total Protein: 6.8 g/dL (ref 6.5–8.1)

## 2024-11-22 LAB — TROPONIN T, HIGH SENSITIVITY
Troponin T High Sensitivity: 68 ng/L — ABNORMAL HIGH (ref 0–19)
Troponin T High Sensitivity: 61 ng/L — ABNORMAL HIGH (ref 0–19)

## 2024-11-22 LAB — LACTIC ACID, PLASMA: Lactic Acid, Venous: 0.9 mmol/L (ref 0.5–1.9)

## 2024-11-22 LAB — TSH: TSH: 1.83 u[IU]/mL (ref 0.350–4.500)

## 2024-11-22 LAB — LIPASE, BLOOD: Lipase: 10 U/L — ABNORMAL LOW (ref 11–51)

## 2024-11-22 MED ORDER — ENOXAPARIN SODIUM 40 MG/0.4ML IJ SOSY
40.0000 mg | PREFILLED_SYRINGE | INTRAMUSCULAR | Status: DC
  Administered 2024-11-22 – 2024-11-26 (×5): 40 mg via SUBCUTANEOUS
  Filled 2024-11-22 (×5): qty 0.4

## 2024-11-22 MED ORDER — ACETAMINOPHEN 325 MG PO TABS: 650.0000 mg | ORAL_TABLET | Freq: Four times a day (QID) | ORAL | Status: DC | PRN

## 2024-11-22 MED ORDER — RIVASTIGMINE TARTRATE 3 MG PO CAPS
3.0000 mg | ORAL_CAPSULE | Freq: Every day | ORAL | Status: DC
  Administered 2024-11-22 – 2024-11-25 (×4): 3 mg via ORAL
  Filled 2024-11-22 (×5): qty 1

## 2024-11-22 MED ORDER — MIDODRINE HCL 5 MG PO TABS: 2.5000 mg | ORAL_TABLET | Freq: Three times a day (TID) | ORAL | Status: DC

## 2024-11-22 MED ORDER — ONDANSETRON HCL 4 MG/2ML IJ SOLN: 4.0000 mg | Freq: Four times a day (QID) | INTRAMUSCULAR | Status: DC | PRN

## 2024-11-22 MED ORDER — SENNOSIDES-DOCUSATE SODIUM 8.6-50 MG PO TABS: 1.0000 | ORAL_TABLET | Freq: Every evening | ORAL | Status: DC | PRN

## 2024-11-22 MED ORDER — ONDANSETRON HCL 4 MG PO TABS: 4.0000 mg | ORAL_TABLET | Freq: Four times a day (QID) | ORAL | Status: DC | PRN

## 2024-11-22 MED ORDER — TORSEMIDE 20 MG PO TABS
20.0000 mg | ORAL_TABLET | ORAL | Status: DC
  Filled 2024-11-22: qty 1

## 2024-11-22 MED ORDER — APIXABAN 5 MG PO TABS
5.0000 mg | ORAL_TABLET | Freq: Two times a day (BID) | ORAL | Status: DC
  Filled 2024-11-22: qty 1

## 2024-11-22 MED ORDER — PERFLUTREN LIPID MICROSPHERE
1.0000 mL | INTRAVENOUS | Status: AC | PRN
  Administered 2024-11-22: 3 mL via INTRAVENOUS

## 2024-11-22 MED ORDER — PANTOPRAZOLE SODIUM 40 MG PO TBEC
40.0000 mg | DELAYED_RELEASE_TABLET | Freq: Every evening | ORAL | Status: DC
  Filled 2024-11-22: qty 1

## 2024-11-22 MED ORDER — ACETAMINOPHEN 650 MG RE SUPP: 650.0000 mg | Freq: Four times a day (QID) | RECTAL | Status: DC | PRN

## 2024-11-22 MED ORDER — PANTOPRAZOLE SODIUM 40 MG PO TBEC
40.0000 mg | DELAYED_RELEASE_TABLET | Freq: Every evening | ORAL | Status: DC
  Administered 2024-11-23 – 2024-11-26 (×4): 40 mg via ORAL
  Filled 2024-11-22 (×4): qty 1

## 2024-11-22 MED ORDER — FLUTICASONE PROPIONATE 50 MCG/ACT NA SUSP: 1.0000 | Freq: Every day | NASAL | Status: DC | PRN

## 2024-11-22 MED ORDER — HYDROMORPHONE HCL 1 MG/ML IJ SOLN: 0.5000 mg | INTRAMUSCULAR | Status: DC | PRN

## 2024-11-22 MED ORDER — ROSUVASTATIN CALCIUM 20 MG PO TABS
20.0000 mg | ORAL_TABLET | Freq: Every day | ORAL | Status: DC
  Administered 2024-11-23 – 2024-11-26 (×4): 20 mg via ORAL
  Filled 2024-11-22 (×2): qty 2
  Filled 2024-11-22: qty 1
  Filled 2024-11-22: qty 2
  Filled 2024-11-22: qty 1

## 2024-11-22 MED ORDER — SODIUM CHLORIDE 0.9 % IV BOLUS
1000.0000 mL | Freq: Once | INTRAVENOUS | Status: AC
  Administered 2024-11-22: 1000 mL via INTRAVENOUS

## 2024-11-22 MED ORDER — BISACODYL 5 MG PO TBEC: 5.0000 mg | DELAYED_RELEASE_TABLET | Freq: Every day | ORAL | Status: DC | PRN

## 2024-11-22 MED ORDER — TRAZODONE HCL 50 MG PO TABS: 25.0000 mg | ORAL_TABLET | Freq: Every evening | ORAL | Status: DC | PRN

## 2024-11-22 MED ORDER — METOPROLOL SUCCINATE ER 25 MG PO TB24
12.5000 mg | ORAL_TABLET | Freq: Every day | ORAL | Status: DC
  Administered 2024-11-23 – 2024-11-26 (×3): 12.5 mg via ORAL
  Filled 2024-11-22 (×4): qty 1

## 2024-11-22 MED ORDER — RIVASTIGMINE TARTRATE 1.5 MG PO CAPS
1.5000 mg | ORAL_CAPSULE | Freq: Every day | ORAL | Status: DC
  Administered 2024-11-23 – 2024-11-26 (×4): 1.5 mg via ORAL
  Filled 2024-11-22 (×5): qty 1

## 2024-11-22 MED ORDER — RIVASTIGMINE TARTRATE 1.5 MG PO CAPS: 1.5000 mg | ORAL_CAPSULE | ORAL | Status: DC

## 2024-11-22 NOTE — ED Notes (Addendum)
 Assissted pt to side of bed to stand with walker to use urinal. Pt daughter at bedside helped. Pt voided ~18mL urine

## 2024-11-22 NOTE — ED Triage Notes (Addendum)
 Pt arrives via [facility] from home for a fall out of a recliner. Pt slipped out of chair and was complaining of mid to lower back pain with EMS. Hx of cognitive issues and a stroke in 2016, per EMS. Pt alert and oriented to himself on arrival.   EMS vitals: 99% Spo2 140/86 BP 127 CBG 98.65F temp

## 2024-11-22 NOTE — ED Provider Notes (Signed)
 "  [facility] Provider Note    Event Date/Time   First MD Initiated Contact with Patient 11/22/24 [phone]     (approximate)   History   Fall   HPI  Alexander Pechacek. is a 84 y.o. male with a past medical history of CKD, diabetes, hyperlipidemia, history of CVA who presents today for evaluation of low back pain after he slipped out of a chair.  According to his daughter, he has had progressive weakness over the last week, and is unable to walk or even push himself up with his arms since yesterday.  Started using a walker about 3 weeks ago, and previously did not require any assist devices.  Daughter also notes that he has been sleeping 20 hours/day.  Daughter also reports that he was spitting up stomach contents yesterday.  Patient Active Problem List   Diagnosis Date Noted   Fall at home, initial encounter 11/22/2024   Fall 11/22/2024   Acute respiratory failure (HCC) 04/13/2021   CKD stage 3 due to type 2 diabetes mellitus (HCC) 04/13/2021   Acute metabolic encephalopathy 04/13/2021   Sepsis (HCC) 04/13/2021   CAP (community acquired pneumonia) 04/13/2021   Chronic diastolic CHF (congestive heart failure) (HCC) 04/13/2021   Bradycardia 09/25/2020   Fever 09/24/2020   Bradycardia, unspecified 09/24/2020   Pre-diabetes    Hypokalemia    Mild protein malnutrition    Grade I diastolic dysfunction    Hyperlipidemia    Altered mental status    TIA (transient ischemic attack) 09/17/2020   Abnormal SPEP 06/23/2020   Primary hypertension 06/23/2020   Sinus bradycardia 06/22/2020   Premature ventricular contractions 06/22/2020   Heart palpitations 06/22/2020   Dizziness 06/22/2020   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 08/22/2018   CVA (cerebral vascular accident) (HCC) 05/30/2018   Muscle strain of thigh 05/15/2018   Trochanteric bursitis of right hip 02/14/2018   Hip fracture, unspecified laterality, closed, initial encounter (HCC) 09/28/2017    Hip fracture (HCC) 09/28/2017          Physical Exam   Triage Vital Signs: ED Triage Vitals  Encounter Vitals Group     BP      Girls Systolic BP Percentile      Girls Diastolic BP Percentile      Boys Systolic BP Percentile      Boys Diastolic BP Percentile      Pulse      Resp      Temp      Temp src      SpO2      Weight      Height      Head Circumference      Peak Flow      Pain Score      Pain Loc      Pain Education      Exclude from Growth Chart     Most recent vital signs: Vitals:   11/22/24 0754 11/22/24 1121  BP: (!) 152/98   Pulse: 85   Resp: 16   Temp: (!) 97.5 F (36.4 C) 98 F (36.7 C)  SpO2: 97%     Physical Exam Vitals and nursing note reviewed.  Constitutional:      General: Awake and alert. No acute distress.    Appearance: Normal appearance. The patient is normal weight.  HENT:     Head: Normocephalic and atraumatic.     Mouth: Mucous membranes are moist.  Eyes:     General:  PERRL. Normal EOMs        Right eye: No discharge.        Left eye: No discharge.     Conjunctiva/sclera: Conjunctivae normal.  Cardiovascular:     Rate and Rhythm: Normal rate.     Pulses: Normal pulses.  Pulmonary:     Effort: Pulmonary effort is normal. No respiratory distress.     Breath sounds: Normal breath sounds.  Abdominal:     Abdomen is soft. There is no abdominal tenderness. No rebound or guarding. No distention. Musculoskeletal:        General: No swelling. Normal range of motion.     Cervical back: Normal range of motion and neck supple. No midline cervical spine tenderness.  Full range of motion of neck.   Normal strength and sensation in bilateral upper extremities. Normal grip strength bilaterally.  Normal intrinsic muscle function of the hand bilaterally.  Normal radial pulses bilaterally. No thoracic or vertebral tenderness.  Negative straight leg raise bilaterally. Skin:    General: Skin is warm and dry.     Capillary Refill:  Capillary refill takes less than 2 seconds.     Findings: No rash.  No evidence of skin infection Neurological:     Mental Status: The patient is awake and alert.  At mental baseline per daughter at bedside.  Normal strength and sensation of bilateral upper extremities, 3-4 out of 5 strength to lower extremities.  Normal finger-to-nose.  Face is symmetric.     ED Results / Procedures / Treatments   Labs (all labs ordered are listed, but only abnormal results are displayed) Labs Reviewed  COMPREHENSIVE METABOLIC PANEL WITH GFR - Abnormal; Notable for the following components:      Result Value   Glucose, Bld 120 (*)    All other components within normal limits  LIPASE, BLOOD - Abnormal; Notable for the following components:   Lipase 10 (*)    All other components within normal limits  URINALYSIS, W/ REFLEX TO CULTURE (INFECTION SUSPECTED) - Abnormal; Notable for the following components:   Color, Urine AMBER (*)    APPearance HAZY (*)    Hgb urine dipstick SMALL (*)    Protein, ur 30 (*)    All other components within normal limits  TROPONIN T, HIGH SENSITIVITY - Abnormal; Notable for the following components:   Troponin T High Sensitivity 68 (*)    All other components within normal limits  TROPONIN T, HIGH SENSITIVITY - Abnormal; Notable for the following components:   Troponin T High Sensitivity 61 (*)    All other components within normal limits  CBC WITH DIFFERENTIAL/PLATELET  LACTIC ACID, PLASMA  CK  TSH     EKG     RADIOLOGY I independently reviewed and interpreted imaging and agree with radiologists findings.     PROCEDURES:  Critical Care performed:   Procedures   MEDICATIONS ORDERED IN ED: Medications  metoprolol  succinate (TOPROL -XL) 24 hr tablet 12.5 mg (has no administration in time range)  rosuvastatin  (CRESTOR ) tablet 20 mg (has no administration in time range)  pantoprazole  (PROTONIX ) EC tablet 40 mg (has no administration in time range)   fluticasone  (FLONASE ) 50 MCG/ACT nasal spray 1-2 spray (has no administration in time range)  acetaminophen  (TYLENOL ) tablet 650 mg (has no administration in time range)    Or  acetaminophen  (TYLENOL ) suppository 650 mg (has no administration in time range)  HYDROmorphone  (DILAUDID ) injection 0.5-1 mg (has no administration in time range)  traZODone  (DESYREL ) tablet  25 mg (has no administration in time range)  senna-docusate (Senokot-S) tablet 1 tablet (has no administration in time range)  ondansetron  (ZOFRAN ) tablet 4 mg (has no administration in time range)    Or  ondansetron  (ZOFRAN ) injection 4 mg (has no administration in time range)  bisacodyl  (DULCOLAX) EC tablet 5 mg (has no administration in time range)  rivastigmine  (EXELON ) capsule 1.5 mg (has no administration in time range)    And  rivastigmine  (EXELON ) capsule 3 mg (has no administration in time range)  enoxaparin  (LOVENOX ) injection 40 mg (has no administration in time range)  sodium chloride  0.9 % bolus 1,000 mL (0 mLs Intravenous Stopped 11/22/24 1119)     IMPRESSION / MDM / ASSESSMENT AND PLAN / ED COURSE  I reviewed the triage vital signs and the nursing notes.   Differential diagnosis includes, but is not limited to, weakness, electrolyte disarray, infection, CVA, cardiac event, lumbar radiculopathy.  Patient is awake and alert, though minimally participatory in exam.  Says yes or no to answer questions and follows commands, is at his mental baseline according to daughter at bedside.  His strength is symmetric bilaterally and he is able to lift bilateral arms and legs, I do not suspect occult fracture.  There does not appear to be acute focal neurological deficit.  I reviewed the patient's chart.  Patient was seen by neurology 2 days ago and diagnosed with lumbar radiculopathy with chronic right thigh pain and muscle weakness, to which neurology attributed his frequent falls and lack of stability.  Further workup is  indicated today.  CT head and neck obtained for evaluation of central or cervical spine etiology was negative.  CT lumbar spine does not reveal any acute findings.  X-ray chest and pelvis obtained given his fall at home, this was negative for any acute findings.  Labs obtained are overall reassuring with the exception of an elevated troponin to 68.  Creatinine is normal.  It is possible that he had NSTEMI or cardiac event 3 weeks ago when his symptoms began and is downtrending from there, or it is possible that he had a stroke several weeks ago, though there is no evidence of this on CAT scan.  Regardless, feel that he would benefit from admission to the hospital for further evaluation and management.  I discussed this option with the patient and his daughter and they are in agreement with plan.  I consulted the hospitalist for admission.  Patient was accepted by Dr. Laurita.  I discussed the case with Dr. Dorothyann who also evaluated the patient and agrees with assessment and plan.  Patient's presentation is most consistent with acute presentation with potential threat to life or bodily function.      FINAL CLINICAL IMPRESSION(S) / ED DIAGNOSES   Final diagnoses:  Fall, initial encounter  Weakness  Elevated troponin     Rx / DC Orders   ED Discharge Orders     None        Note:  This document was prepared using Dragon voice recognition software and may include unintentional dictation errors.   Wong Steadham E, PA-C 11/22/24 1238    Dorothyann Drivers, MD 11/22/24 1355  "

## 2024-11-22 NOTE — H&P (Addendum)
 " History and Physical    Alexander Kline Alexander Kline. FMW:[id number] DOB: 27-Aug-1941 DOA: 11/22/2024  PCP: Sherial Bail, MD (Confirm with patient/family/NH records and if not entered, this has to be entered at [facility] point of entry) Patient coming from: Home  I have personally briefly reviewed patient's old medical records in [facility]  Chief Complaint: Generalized weakness, fall  HPI: Alexander Kline. is a 84 y.o. 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  Lately about 6 weeks ago patient started to experience worsening of back pain and bilateral lower extremity weakness, right more than left.  Went to see neurology, who suspect patient has a worsening of herniated disc problems and a outpatient MRI was ordered.  Patient was also evaluated by PCP recently for increasing daytime sleepiness, who suspected that patient has a underlying OSA and a sleep study was scheduled in March.  Patient had a mechanical fall this morning when he slipped off a chair and hit his back.  Unable to stand up because of severe back pain.  Denies any cough or shortness of breath no chest pains, no urinary problems or diarrhea.  Denies any prodrome of lightheadedness blurry vision.  No head or neck injury, no LOC.  ED Course: Afebrile, no tachycardia no hypotension blood pressure 152/98 physician 100% on room air.  Chest x-ray negative for acute infiltrates, CT head and neck negative for any trauma or intracranial bleed.  X-ray of lumbar spine and hip and pelvis showed no problems or fracture.  UA negative for UTI. Trop first set 68.  Review of Systems: As per HPI otherwise 14 point  review of systems negative.    Past Medical History:  Diagnosis Date   Grade I diastolic dysfunction    Hyperlipidemia    Hypertension    Pre-diabetes    Stroke [facility])     Past Surgical History:  Procedure Laterality Date   COLONOSCOPY WITH PROPOFOL  N/A 01/14/2020   Procedure: COLONOSCOPY WITH PROPOFOL ;  Surgeon: Toledo, Ladell POUR, MD;  Location: [facility] ENDOSCOPY;  Service: Gastroenterology;  Laterality: N/A;   ESOPHAGOGASTRODUODENOSCOPY (EGD) WITH PROPOFOL  N/A 01/14/2020   Procedure: ESOPHAGOGASTRODUODENOSCOPY (EGD) WITH PROPOFOL ;  Surgeon: Toledo, Ladell POUR, MD;  Location: [facility] ENDOSCOPY;  Service: Gastroenterology;  Laterality: N/A;   HIP ARTHROPLASTY Right 09/29/2017   Procedure: ARTHROPLASTY BIPOLAR HIP (HEMIARTHROPLASTY);  Surgeon: Cleotilde Barrio, MD;  Location: [facility] ORS;  Service: Orthopedics;  Laterality: Right;   JOINT REPLACEMENT     PACEMAKER IMPLANT N/A 09/28/2020   Procedure: PACEMAKER IMPLANT;  Surgeon: Ammon Blunt, MD;  Location: [facility] INVASIVE CV LAB;  Service: Cardiovascular;  Laterality: N/A;   TEE WITHOUT CARDIOVERSION N/A 07/26/2018   Procedure: TRANSESOPHAGEAL ECHOCARDIOGRAM (TEE);  Surgeon: Fernand Denyse LABOR, MD;  Location: [facility] ORS;  Service: Cardiovascular;  Laterality: N/A;     reports that he has never smoked. He has never used smokeless tobacco. He reports that he does not drink alcohol and does not use drugs.  Allergies[1]  Family History  Problem Relation Age of Onset   CAD Father    CAD Brother     Prior to Admission medications  Medication Sig Start Date End Date Taking? Authorizing Provider  amoxicillin -clavulanate (AUGMENTIN ) 875-125 MG tablet Take 1 tablet by mouth every 12 (twelve) hours. 04/16/21   Vann, Jessica U, DO  apixaban  (ELIQUIS ) 5 MG TABS tablet Take 5 mg by mouth 2 (two) times daily.     [provider]  B Complex Vitamins (VITAMIN B COMPLEX PO) Take 1 tablet by mouth daily.     [provider]  Cholecalciferol  25  MCG (1000 UT) tablet Take 3,000 Units by mouth daily.    [provider]  Coenzyme Q10 (COQ-10) 100 MG CAPS Take 100 mg by mouth every evening.    [provider]  D-MANNOSE PO Take 600 mg by mouth in the morning and at bedtime.    [provider]  ferrous gluconate  (FERGON) 240 (27 FE) MG tablet Take 240 mg by mouth daily.    [provider]  fluticasone  (FLONASE ) 50 MCG/ACT nasal spray Place 1-2 sprays into both nostrils daily as needed (allergies.).  08/18/19   [provider]  food thickener (THICK IT) POWD Thicken to nectar thick liquids 04/16/21   Vann, Jessica U, DO  metoprolol  succinate (TOPROL -XL) 25 MG 24 hr tablet Take 0.5 tablets (12.5 mg total) by mouth daily with breakfast. 04/17/21   Juvenal Harlene PENNER, DO  midodrine  (PROAMATINE ) 2.5 MG tablet Take 2.5 mg by mouth 3 (three) times daily.    [provider]  Multiple Vitamin (MULTIVITAMIN WITH MINERALS) TABS tablet Take 1 tablet by mouth every evening.    [provider]  omeprazole (PRILOSEC OTC) 20 MG tablet Take 20 mg by mouth every evening.    [provider]  rivastigmine  (EXELON ) 1.5 MG capsule Take 1.5-3 mg by mouth See admin instructions. Take 1 capsule (1.5mg ) by mouth every morning and take 2 capsules (3mg ) by mouth every night    [provider]  rosuvastatin  (CRESTOR ) 20 MG tablet Take 20 mg by mouth daily.    [provider]  torsemide  (DEMADEX ) 20 MG tablet Take 20 mg by mouth every other day.    [provider]    Physical Exam: Vitals:   11/22/24 0754  BP: (!) 152/98  Pulse: 85  Resp: 16  Temp: (!) 97.5 F (36.4 C)  TempSrc: Oral  SpO2: 97%    Constitutional: NAD, calm, comfortable Vitals:   11/22/24 0754  BP: (!) 152/98  Pulse: 85  Resp: 16  Temp: (!) 97.5 F (36.4 C)  TempSrc: Oral  SpO2: 97%   Eyes: PERRL, lids and conjunctivae normal ENMT: Mucous membranes are moist. Posterior pharynx clear of any  exudate or lesions.Normal dentition.  Neck: normal, supple, no masses, no thyromegaly Respiratory: clear to auscultation bilaterally, no wheezing, no crackles. Normal respiratory effort. No accessory muscle use.  Cardiovascular: Regular rate and rhythm, no murmurs / rubs / gallops. No extremity edema. 2+ pedal pulses. No carotid bruits.  Abdomen: no tenderness, no masses palpated. No hepatosplenomegaly. Bowel sounds positive.  Musculoskeletal: no clubbing / cyanosis. No joint deformity upper and lower extremities. Good ROM, no contractures. Normal muscle tone.  Skin: no rashes, lesions, ulcers. No induration Neurologic: CN 2-12 grossly intact. Sensation intact, DTR normal. Strength 4/5 in lower extremity compared to 5/5 on bilateral upper extremities. Psychiatric: Normal judgment and insight. Alert and oriented x 3. Normal mood.     Labs on Admission: I have personally reviewed following labs and imaging studies  CBC: Recent Labs  Lab 11/22/24 0911  WBC 7.9  NEUTROABS 5.0  HGB 14.4  HCT 42.3  MCV 90.4  PLT 181   Basic Metabolic Panel: Recent Labs  Lab 11/22/24 0911  NA 142  K 3.8  CL 106  CO2 26  GLUCOSE 120*  BUN 19  CREATININE 1.09  CALCIUM  9.7   GFR: CrCl cannot be calculated (Unknown ideal weight.). Liver Function Tests: Recent Labs  Lab 11/22/24 0911  AST 35  ALT 23  ALKPHOS 91  BILITOT 0.5  PROT 6.8  ALBUMIN 3.7   Recent Labs  Lab 11/22/24 0911  LIPASE 10*   No results for input(s): AMMONIA in the last 168 hours. Coagulation Profile: No results for input(s): INR, PROTIME in the last 168 hours. Cardiac Enzymes: No results for input(s): CKTOTAL, CKMB, CKMBINDEX, TROPONINI in the last 168 hours. BNP (last 3 results) No results for input(s): PROBNP in the last 8760 hours. HbA1C: No results for input(s): HGBA1C in the last 72 hours. CBG: No results for input(s): GLUCAP in the last 168 hours. Lipid Profile: No results for  input(s): CHOL, HDL, LDLCALC, TRIG, CHOLHDL, LDLDIRECT in the last 72 hours. Thyroid  Function Tests: No results for input(s): TSH, T4TOTAL, FREET4, T3FREE, THYROIDAB in the last 72 hours. Anemia Panel: No results for input(s): VITAMINB12, FOLATE, FERRITIN, TIBC, IRON , RETICCTPCT in the last 72 hours. Urine analysis:    Component Value Date/Time   COLORURINE AMBER (A) 11/22/2024 0823   APPEARANCEUR HAZY (A) 11/22/2024 0823   LABSPEC 1.026 11/22/2024 0823   PHURINE 5.0 11/22/2024 0823   GLUCOSEU NEGATIVE 11/22/2024 0823   HGBUR SMALL (A) 11/22/2024 0823   BILIRUBINUR NEGATIVE 11/22/2024 0823   KETONESUR NEGATIVE 11/22/2024 0823   PROTEINUR 30 (A) 11/22/2024 0823   NITRITE NEGATIVE 11/22/2024 0823   LEUKOCYTESUR NEGATIVE 11/22/2024 0823    Radiological Exams on Admission: DG Chest 1 View Result Date: 11/22/2024 CLINICAL DATA:  Fall.  Weakness. EXAM: CHEST  1 VIEW COMPARISON:  04/13/2021 FINDINGS: The heart size and mediastinal contours are within normal limits. Pacemaker remains in place. Both lungs are clear. No evidence of pneumothorax or hemothorax. Internal fixation plate and screws are again seen in the left scapula. IMPRESSION: No acute findings or active lung disease. Electronically Signed   By: Norleen DELENA Kil M.D.   On: 11/22/2024 09:34   DG Pelvis Portable Result Date: 11/22/2024 CLINICAL DATA:  Fall.  Pelvic pain. EXAM: PORTABLE PELVIS 1 VIEW COMPARISON:  None Available. FINDINGS: There is no evidence of pelvic fracture or diastasis. Right hip prosthesis and lower lumbar spine degenerative changes noted. Surgical clip seen in left lower quadrant. IMPRESSION: No evidence of pelvic fracture or other acute findings. Electronically Signed   By: Norleen DELENA Kil M.D.   On: 11/22/2024 09:32   CT Cervical Spine Wo Contrast Result Date: 11/22/2024 EXAM: CT CERVICAL SPINE WITHOUT CONTRAST 11/22/2024 08:45:19 AM TECHNIQUE: CT of the cervical spine was performed  without the administration of intravenous contrast. Multiplanar reformatted images are provided for review. Automated exposure control, iterative reconstruction, and/or weight based adjustment of the mA/kV was utilized to reduce the radiation dose to as low as reasonably achievable. COMPARISON: CT head 11/22/2024 (reported separately). Prior cervical spine CT 09/17/2020. CLINICAL HISTORY: 84 year old male with neck trauma due to a fall from a chair. FINDINGS: BONES AND ALIGNMENT: Stable lordosis. No acute fracture or traumatic malalignment. DEGENERATIVE CHANGES: Chronic and mostly age appropriate cervical spine degeneration. SOFT TISSUES: Negative visible non-contrast thoracic inlet. No prevertebral soft tissue swelling. Prior cervical spine CT 09/17/2020 was negative for age non-contrast neck soft tissues. IMPRESSION: 1. No acute traumatic injury identified in the  cervical spine. 2. Chronic cervical spine degeneration. Electronically signed by: Helayne Hurst MD 11/22/2024 09:03 AM EST RP Workstation: [id number]   CT Lumbar Spine Wo Contrast Result Date: 11/22/2024 EXAM: CT OF THE LUMBAR SPINE WITHOUT CONTRAST 11/22/2024 08:45:19 AM TECHNIQUE: CT of the lumbar spine was performed without the administration of intravenous contrast. Multiplanar reformatted images are provided for review. Automated exposure control, iterative reconstruction, and/or weight based adjustment of the mA/kV was utilized to reduce the radiation dose to as low as reasonably achievable. COMPARISON: Lumbar MRI 04/20/2021. CLINICAL HISTORY: 84 year old male. Back trauma, no prior imaging. Fall from chair. FINDINGS: Normal lumbar segmentation, the same numbering system used on the previous MRI. BONES AND ALIGNMENT: Chronic T12 superior endplate Schmorl node, stable. Maintained lumbar vertebral height. Small chronic degenerative endplate Schmorl nodes at L3-L4 and L4-L5. Chronic vacuum disc at L3-L4 and L4-L5. Normal alignment except for chronic  grade 1 spondylolisthesis at L3-L4 and L4-L5. Visible sacrum and SI joints appear intact with partial degenerative appearing SI joint fusion. No acute fracture or suspicious bone lesion. SOFT TISSUES: Mild to moderate calcified abdominal aortic atherosclerosis. Diverticulosis of the large bowel in the left lower quadrant. Negative non-contrast lumbar paraspinal soft tissues. DEGENERATIVE CHANGES: Stable lumbar spine degeneration from previous MRI, mild for age above the L3-L4 level. L3-L4 and L4-L5 chronic multifactorial spinal stenosis in the setting of mild grade 1 spondylolisthesis at each level. Mild to moderate spinal stenosis at L3-L4 and chronic severe spinal stenosis at L4-L5. Severe right L4 neural foraminal stenosis. IMPRESSION: 1. No acute traumatic injury identified in the lumbar spine. 2. Chronic lumbar spine degeneration Stable from 2022 MRI. 3. Aortic Atherosclerosis (ICD10-I70.0). Electronically signed by: Helayne Hurst MD 11/22/2024 08:57 AM EST RP Workstation: [id number]   CT Head Wo Contrast Result Date: 11/22/2024 EXAM: CT HEAD WITHOUT CONTRAST 11/22/2024 08:45:19 AM TECHNIQUE: CT of the head was performed without the administration of intravenous contrast. Automated exposure control, iterative reconstruction, and/or weight based adjustment of the mA/kV was utilized to reduce the radiation dose to as low as reasonably achievable. COMPARISON: Brain MRI 09/17/2020, Head CT 09/24/2020. CLINICAL HISTORY: 84 year old male. Head trauma, minor (Age >= 65 years). Fall from chair. FINDINGS: BRAIN AND VENTRICLES: No acute hemorrhage. No evidence of acute infarct. No hydrocephalus. No extra-axial collection. No mass effect or midline shift. Brain volume is mildly decreased since 2021 in a generalized fashion. Ex vacuo appearing ventricular enlargement is very similar. Chronic periventricular white matter hypodensity is moderate and not significantly changed. No suspicious intracranial vascular  hyperdensity. Mild for age calcified atherosclerosis at the skull base. ORBITS: No acute abnormality. SINUSES: No acute abnormality. SOFT TISSUES AND SKULL: No acute soft tissue abnormality. No skull fracture. IMPRESSION: 1. No acute traumatic injury identified.  No acute intracranial abnormality. Electronically signed by: Helayne Hurst MD 11/22/2024 08:51 AM EST RP Workstation: [id number]    EKG: Independently reviewed.  Atrial paced  Assessment/Plan Principal Problem:   Fall Active Problems:   Fall at home, initial encounter  (please populate well all problems here in Problem List. (For example, if patient is on BP meds at home and you resume or decide to hold them, it is a problem that needs to be her. Same for CAD, COPD, HLD and so on)  Generalized weakness Fall at home - Trauma scan negative - Etiology likely secondary to a gradually worsening of chronic back problems/herniated lumbar spine discs - Pain control - PT OT evaluation - Outpatient MRI - Other DDx, reveal patient cardiac and  pulmonary history, in 2022 there was a note from pulmonary mentioned that patient has pulmonary hypertension however patient was never followed up with the usual.  At that point pulmonary recommended possible RHC. Check CK level  Questional OSA - Check nocturnal pulse ox - Check TSH - Outpatient sleep study scheduled for March  Elevated troponins - No chest pains -Trend trop -Echo - Consider outpatient stress test.  Chronic HFrEF with recovered LVEF - Euvolemic, on torsemide  Monday Wednesday Friday  Obesity - Estimated BMI> 30 - Calorie control recommended  Hx of embolic stroke - Off Eliquis  -Continue Statin  PPM - No acute concern  DVT prophylaxis: Lovenox  Code Status: Full code Family Communication: Daughter at bedside Disposition Plan: Patient is sick with frequent falls, worsening of lower extremity weakness following a fall event, requiring IV pain medications and PT evaluation,  expect more than 2 midnight hospital stay. Consults called: None Admission status: Telemetry admission   Cort ONEIDA Mana MD [facility] Pager [phone] 11/22/2024, 10:41 AM        [1]  Allergies Allergen Reactions   Sulfa Antibiotics     Per pt, he gets bad headaches with sulfa drugs   "

## 2024-11-22 NOTE — Progress Notes (Signed)
 Echocardiogram 2D Echocardiogram has been performed.  Ruhee Enck N Perla Echavarria,RDCS 11/22/2024, 1:35 PM

## 2024-11-22 NOTE — ED Provider Notes (Signed)
----------------------------------------- °  10:14 AM on 11/22/2024 ----------------------------------------- I have personally seen and evaluated the patient in conjunction with the physician assistant.  Patient provides a story of worsening generalized weakness over the past 3 weeks now having to use a walker when previously did not require any assistive devices.  Patient has become so weak that he slumped out of the recliner this morning.  His lab work today shows reassuring CBC reassuring chemistry lactic acid is normal.  Patient's troponin however resulted elevated at 68 with normal renal function.  Urinalysis is normal.  Chest x-ray is clear, pelvis x-ray is negative.  CT scan of the head is negative for acute intracranial abnormality, CT cervical spine is negative and CT lumbar spine shows no acute process.  Patient's generalized weakness with elevated troponin would be concerning for possible recent cardiac event although denies any chest pain, possible CVA CTs are negative patient has a pacemaker but it is MRI compatible.  Given the elevated troponin I believe the patient would require admission to the hospital service for further workup and treatment, as well as possible cardiology consultation or echocardiogram.  If possible during this admission an MRI may be helpful however I believe that is less of a priority at this time.  Patient and daughter are agreeable to this plan of care admission and workup.   Dorothyann Drivers, MD 11/22/24 1016

## 2024-11-22 NOTE — Evaluation (Signed)
 Occupational Therapy Evaluation Patient Details Name: Alexander Kline. MRN: [id number] DOB: 1941/10/28 Today's Date: 11/22/2024   History of Present Illness   84 y.o. 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  Patient's daughter, [name], present to confirm baseline. Pt up until ~ 6 weeks ago was ambulating without RW but recently has had decline and utilizing RW for mobility and needing assistance for self care tasks from wife. Patient currently functioning at +2 assistance for bed mobility, functional transfers, and mobility. Pt also needing assistance for self care tasks and is not at baseline. Patient will benefit from acute OT to increase overall independence in the areas of ADLs, functional mobility, and safety awareness in order to safely discharge.      If plan is discharge home, recommend the following:   A lot of help with walking and/or transfers;A lot of help with bathing/dressing/bathroom;Assistance with cooking/housework;Direct supervision/assist for medications management;Direct supervision/assist for financial management;Assist for transportation;Help with stairs or ramp for entrance;Supervision due to cognitive status     Functional Status Assessment   Patient has had a recent decline in their functional status and demonstrates the ability to make significant improvements in function in a reasonable and predictable amount of time.     Equipment Recommendations   None recommended by OT      Precautions/Restrictions   Precautions Precautions: Fall     Mobility Bed Mobility Overal bed mobility: Needs Assistance Bed  Mobility: Sidelying to Sit, Sit to Sidelying   Sidelying to sit: Mod assist, Max assist, +2 for physical assistance     Sit to sidelying: Mod assist, Max assist, +2 for physical assistance General bed mobility comments: log roll for comfort    Transfers Overall transfer level: Needs assistance Equipment used: 2 person hand held assist, Rolling walker (2 wheels) Transfers: Sit to/from Stand Sit to Stand: Mod assist, +2 physical assistance, +2 safety/equipment                  Balance Overall balance assessment: Needs assistance Sitting-balance support: Feet supported Sitting balance-Leahy Scale: Fair     Standing balance support: Reliant on assistive device for balance, Bilateral upper extremity supported Standing balance-Leahy Scale: Poor                             ADL either performed or assessed with clinical judgement   ADL Overall ADL's : Needs assistance/impaired                     Lower Body Dressing: Minimal assistance Lower Body Dressing Details (indicate cue type and reason): assistance to don shoes and tie laces Toilet Transfer: Minimal assistance;+2 for physical assistance;+2 for safety/equipment;Rolling walker (2 wheels) Toilet Transfer Details (indicate cue type and reason): simluated                 Vision Patient Visual Report: No change from baseline              Pertinent Vitals/Pain Pain Assessment Pain Assessment: Faces Faces Pain Scale: Hurts a little bit Pain Location: chronic back pain when repositioning in bed Pain Descriptors / Indicators: Discomfort, Grimacing Pain  Intervention(s): Monitored during session, Repositioned     Extremity/Trunk Assessment Upper Extremity Assessment Upper Extremity Assessment: Generalized weakness   Lower Extremity Assessment Lower Extremity Assessment: Generalized weakness       Communication Communication Communication: Impaired Factors Affecting Communication:  Hearing impaired   Cognition Arousal: Alert Behavior During Therapy: Flat affect Cognition: History of cognitive impairments             OT - Cognition Comments: 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                 Following commands: Impaired Following commands impaired: Follows one step commands with increased time, Only follows one step commands consistently     Cueing  General Comments   Cueing Techniques: Verbal cues;Tactile cues              Home Living Family/patient expects to be discharged to:: Private residence Living Arrangements: Spouse/significant other Available Help at Discharge: Family;Available 24 hours/day Type of Home: House Home Access: Stairs to enter [employer] of Steps: 6 Entrance Stairs-Rails: Right;Left Home Layout: One level     Bathroom Shower/Tub: Sponge bathes at baseline         Home Equipment: [employer] (2 wheels);Cane - quad   Additional Comments: electric chair lift      Prior Functioning/Environment Prior Level of Function : History of Falls (last six months);Independent/Modified Independent;Needs assist               ADLs Comments: Generally Ind but needing more assistance and having more falls over the last few weeks. Most recently using RW for mobility and lift chair. Wife assisting with ADLs as needed.    OT Problem List: Decreased strength;Decreased activity tolerance;Impaired balance (sitting and/or standing);Decreased safety awareness   OT Treatment/Interventions: Self-care/ADL training;Balance training;Therapeutic exercise;Neuromuscular education;Therapeutic activities;Energy conservation;DME and/or AE instruction;Patient/family education      OT Goals(Current goals can be found in the care plan section)   Acute Rehab OT Goals Patient Stated Goal: to get stronger OT Goal Formulation: With patient/family Time For Goal Achievement: 12/06/24 Potential to Achieve Goals: Fair ADL  Goals Pt Will Perform Grooming: with supervision;standing Pt Will Perform Lower Body Dressing: with contact guard assist;sit to/from stand Pt Will Transfer to Toilet: with contact guard assist;ambulating Pt Will Perform Toileting - Clothing Manipulation and hygiene: with contact guard assist;sit to/from stand   OT Frequency:  Min 2X/week    Co-evaluation PT/OT/SLP Co-Evaluation/Treatment: Yes Reason for Co-Treatment: Complexity of the patient's impairments (multi-system involvement);For patient/therapist safety;To address functional/ADL transfers PT goals addressed during session: Mobility/safety with mobility OT goals addressed during session: ADL's and self-care      AM-PAC OT 6 Clicks Daily Activity     Outcome Measure Help from another person eating meals?: None Help from another person taking care of personal grooming?: A Little Help from another person toileting, which includes using toliet, bedpan, or urinal?: A Lot Help from another person bathing (including washing, rinsing, drying)?: A Lot Help from another person to put on and taking off regular upper body clothing?: A Little Help from another person to put on and taking off regular lower body clothing?: A Lot 6 Click Score: 16   End of Session Equipment Utilized During Treatment: Rolling walker (2 wheels)  Activity Tolerance: Patient tolerated treatment well;Patient limited by fatigue Patient left: in bed;with bed alarm set;with call Herald/phone within reach;with family/visitor present  OT Visit Diagnosis: Unsteadiness on feet (R26.81);Repeated falls (R29.6);Muscle weakness (generalized) (M62.81)  Time: 8641-8574 OT Time Calculation (min): 27 min Charges:  OT General Charges $OT Visit: 1 Visit OT Evaluation $OT Eval Moderate Complexity: 1 [address], [place], OTR/L , CBIS ascom [phone]  11/22/24, 2:41 PM

## 2024-11-22 NOTE — Evaluation (Signed)
 Physical Therapy Evaluation Patient Details Name: Alexander Kline. MRN: [id number] DOB: [birth year] Today's Date: 11/22/2024  History of Present Illness  Pt is an 84 y.o. 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   Clinical Impression  Pt pleasant with some confusion and difficulty providing history with daughter present to assist.  Pt required +2 assistance with functional tasks per below despite putting forth good effort with multi-modal cuing for sequencing.  Pt was able to amb 10 feet this session but presented with poor gait kinematics and is at a very high risk for additional falls at this time.  Pt will benefit from continued PT services upon discharge to safely address deficits listed in patient problem list for decreased caregiver assistance and eventual return to PLOF.          If plan is discharge home, recommend the following: Two people to help with walking and/or transfers;A lot of help with bathing/dressing/bathroom;Assistance with cooking/housework;Direct supervision/assist for medications management;Supervision due to cognitive status;Direct supervision/assist for financial management;Assist for transportation;Help with stairs or ramp for entrance   Can travel by private vehicle   No    Equipment Recommendations Other (comment) (TBD at next venue of care)  Recommendations for Other Services       Functional Status Assessment Patient has had a recent decline in their functional status and demonstrates the ability to make significant improvements in function in a reasonable and predictable amount of time.     Precautions / Restrictions Precautions Precautions: Fall Restrictions Weight Bearing Restrictions Per Provider Order: No      Mobility  Bed  Mobility Overal bed mobility: Needs Assistance Bed Mobility: Sidelying to Sit, Sit to Sidelying, Rolling Rolling: Min assist, +2 for physical assistance Sidelying to sit: Mod assist, Max assist, +2 for physical assistance     Sit to sidelying: Mod assist, Max assist, +2 for physical assistance General bed mobility comments: log roll training with daughter present for comfort with +2 assist needed for BLE and trunk control along with multi-modal cues for sequencing    Transfers Overall transfer level: Needs assistance Equipment used: Rolling walker (2 wheels) Transfers: Sit to/from Stand Sit to Stand: Mod assist, +2 physical assistance, +2 safety/equipment           General transfer comment: Multi-modal cues for sequencing and to initiate movement    Ambulation/Gait Ambulation/Gait assistance: Contact guard assist, +2 safety/equipment Gait Distance (Feet): 10 Feet Assistive device: Rolling walker (2 wheels) Gait Pattern/deviations: Step-through pattern, Decreased step length - right, Decreased step length - left, Shuffle, Trunk flexed Gait velocity: decreased     General Gait Details: Pt able to amb room distance with effortful, shuffling steps and mod lean on the RW for support with occasional mild knee buckling noted that the pt was able to self-correct  Stairs            Wheelchair Mobility     Tilt Bed    Modified Rankin (Stroke Patients Only)       Balance Overall balance assessment: Needs assistance, History of Falls Sitting-balance support: Feet supported Sitting balance-Leahy Scale: Fair     Standing balance support: Reliant on assistive device for balance, Bilateral upper extremity supported, During functional activity Standing balance-Leahy Scale: Poor  Pertinent Vitals/Pain Pain Assessment Faces Pain Scale: Hurts a little bit Pain Location: chronic back pain Pain Descriptors / Indicators: Discomfort,  Grimacing Pain Intervention(s): Premedicated before session, Repositioned, Monitored during session    Home Living Family/patient expects to be discharged to:: Private residence Living Arrangements: Spouse/significant other Available Help at Discharge: Family;Available 24 hours/day Type of Home: House Home Access: Stairs to enter Entrance Stairs-Rails: Right;Left;Can reach both Entrance Stairs-Number of Steps: 6   Home Layout: One level Home Equipment: [employer] (2 wheels);Cane - quad;Lift chair Additional Comments: electric chair lift device for assisting people up from the floor; daughter in room and assisted with history due to pt being a poor historian    Prior Function Prior Level of Function : History of Falls (last six months);Independent/Modified Independent;Needs assist             Mobility Comments: Prior to around 6 weeks ago pt was ambulating independently without an AD, since then pt has been Mod Ind with amb with a RW but in the 1-2 weeks pt has steady become weaker with multiple falls and difficulty ambulating ADLs Comments: Generally Ind but needing more assistance over the last few weeks with wife assisting with ADLs as needed.     Extremity/Trunk Assessment   Upper Extremity Assessment Upper Extremity Assessment: Generalized weakness    Lower Extremity Assessment Lower Extremity Assessment: Generalized weakness       Communication   Communication Communication: Impaired Factors Affecting Communication: Hearing impaired    Cognition Arousal: Alert Behavior During Therapy: Flat affect   PT - Cognitive impairments: History of cognitive impairments                         Following commands: Impaired Following commands impaired: Follows one step commands with increased time, Only follows one step commands consistently     Cueing Cueing Techniques: Verbal cues, Tactile cues     General Comments      Exercises Other  Exercises Other Exercises: Log roll training   Assessment/Plan    PT Assessment Patient needs continued PT services  PT Problem List Decreased strength;Decreased activity tolerance;Decreased balance;Decreased mobility;Decreased knowledge of use of DME;Pain       PT Treatment Interventions DME instruction;Gait training;Stair training;Functional mobility training;Patient/family education;Therapeutic activities;Therapeutic exercise;Balance training    PT Goals (Current goals can be found in the Care Plan section)  Acute Rehab PT Goals Patient Stated Goal: To get stronger before returning home PT Goal Formulation: With family Time For Goal Achievement: 12/05/24 Potential to Achieve Goals: Good    Frequency Min 2X/week     Co-evaluation   Reason for Co-Treatment: Complexity of the patient's impairments (multi-system involvement);For patient/therapist safety;To address functional/ADL transfers PT goals addressed during session: Mobility/safety with mobility OT goals addressed during session: ADL's and self-care       AM-PAC PT 6 Clicks Mobility  Outcome Measure Help needed turning from your back to your side while in a flat bed without using bedrails?: A Lot Help needed moving from lying on your back to sitting on the side of a flat bed without using bedrails?: A Lot Help needed moving to and from a bed to a chair (including a wheelchair)?: A Lot Help needed standing up from a chair using your arms (e.g., wheelchair or bedside chair)?: A Lot Help needed to walk in hospital room?: A Lot Help needed climbing 3-5 steps with a railing? : Total 6 Click Score: 11    End of  Session Equipment Utilized During Treatment: Gait belt Activity Tolerance: Patient tolerated treatment well Patient left: in bed;with call Narayanan/phone within reach;with bed alarm set;with family/visitor present Nurse Communication: Mobility status PT Visit Diagnosis: Unsteadiness on feet (R26.81);History of  falling (Z91.81);Difficulty in walking, not elsewhere classified (R26.2);Muscle weakness (generalized) (M62.81);Pain Pain - part of body:  (low back)    Time: 8641-8575 PT Time Calculation (min) (ACUTE ONLY): 26 min   Charges:   PT Evaluation $PT Eval Moderate Complexity: 1 Mod   PT General Charges $$ ACUTE PT VISIT: 1 Visit       D. Glendia Bertin PT, DPT 11/22/24, 4:29 PM

## 2024-11-22 NOTE — ED Notes (Signed)
 Called dietary for missing lunch tray.

## 2024-11-23 DIAGNOSIS — R5381 Other malaise: Secondary | ICD-10-CM | POA: Diagnosis not present

## 2024-11-23 DIAGNOSIS — W19XXXA Unspecified fall, initial encounter: Secondary | ICD-10-CM | POA: Diagnosis not present

## 2024-11-23 MED ORDER — DICLOFENAC SODIUM 1 % EX GEL
2.0000 g | Freq: Four times a day (QID) | CUTANEOUS | Status: DC
  Administered 2024-11-23: 2 g via TOPICAL
  Filled 2024-11-23 (×2): qty 100

## 2024-11-23 MED ORDER — MIDODRINE HCL 5 MG PO TABS
5.0000 mg | ORAL_TABLET | Freq: Two times a day (BID) | ORAL | Status: DC
  Filled 2024-11-23 (×6): qty 1

## 2024-11-23 MED ORDER — ASPIRIN 81 MG PO TBEC
81.0000 mg | DELAYED_RELEASE_TABLET | Freq: Every day | ORAL | Status: DC
  Administered 2024-11-23 – 2024-11-26 (×4): 81 mg via ORAL
  Filled 2024-11-23 (×4): qty 1

## 2024-11-23 NOTE — Care Management CC44 (Signed)
"         Condition Code 44 Documentation Completed  Patient Details  Name: Alexander Kline. MRN: [id number] Date of Birth: 04/08/1941   Condition Code 44 given:    Patient signature on Condition Code 44 notice:    Documentation of 2 MD's agreement:    Code 44 added to claim:       [name], LCSW 11/23/2024, 2:55 PM  "

## 2024-11-23 NOTE — NC FL2 (Signed)
 " [place]  MEDICAID FL2 LEVEL OF CARE FORM     IDENTIFICATION  Patient Name: Alexander Kline. Birthdate: 03-Dec-1940 Sex: male Admission Date (Current Location): 11/22/2024  [facility] and [place] Number:  Chiropodist and Address:  [facility], [address] [place], [place] [address]      Provider Number: [id number]  Attending Physician Name and Address:  Cosette Blackwater, MD  Relative Name and Phone Number:  [facility]  Daughter, Emergency Contact  [phone] (Mobile)    Current Level of Care: Hospital Recommended Level of Care: Skilled Nursing Facility Prior Approval Number:    Date Approved/Denied:   PASRR Number: [id number]  Discharge Plan: SNF    Current Diagnoses: Patient Active Problem List   Diagnosis Date Noted   Fall at home, initial encounter 11/22/2024   Fall 11/22/2024   Acute respiratory failure (HCC) 04/13/2021   CKD stage 3 due to type 2 diabetes mellitus (HCC) 04/13/2021   Acute metabolic encephalopathy 04/13/2021   Sepsis (HCC) 04/13/2021   CAP (community acquired pneumonia) 04/13/2021   Chronic diastolic CHF (congestive heart failure) (HCC) 04/13/2021   Bradycardia 09/25/2020   Fever 09/24/2020   Bradycardia, unspecified 09/24/2020   Pre-diabetes    Hypokalemia    Mild protein malnutrition    Grade I diastolic dysfunction    Hyperlipidemia    Altered mental status    TIA (transient ischemic attack) 09/17/2020   Abnormal SPEP 06/23/2020   Primary hypertension 06/23/2020   Sinus bradycardia 06/22/2020   Premature ventricular contractions 06/22/2020   Heart palpitations 06/22/2020   Dizziness 06/22/2020   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 08/22/2018   CVA (cerebral vascular accident) (HCC) 05/30/2018   Muscle strain of thigh 05/15/2018   Trochanteric bursitis of right hip 02/14/2018   Hip fracture, unspecified laterality, closed, initial encounter (HCC) 09/28/2017   Hip fracture (HCC)  09/28/2017    Orientation RESPIRATION BLADDER Height & Weight     Self  Normal Continent Weight:   Height:     BEHAVIORAL SYMPTOMS/MOOD NEUROLOGICAL BOWEL NUTRITION STATUS     (Some memory issues) Continent Diet  AMBULATORY STATUS COMMUNICATION OF NEEDS Skin   Supervision Verbally Normal                       Personal Care Assistance Level of Assistance  Bathing, Dressing Bathing Assistance: Limited assistance   Dressing Assistance: Limited assistance     Functional Limitations Info             SPECIAL CARE FACTORS FREQUENCY  PT (By licensed PT), OT (By licensed OT)     PT Frequency: 3x OT Frequency: 3x            Contractures      Additional Factors Info  Code Status, Allergies Code Status Info: FULL Allergies Info: Sulfa Antibiotics           Current Medications (11/23/2024):  This is the current hospital active medication list Current Facility-Administered Medications  Medication Dose Route Frequency Provider Last Rate Last Admin   acetaminophen  (TYLENOL ) tablet 650 mg  650 mg Oral Q6H PRN Laurita Manor T, MD       Or   acetaminophen  (TYLENOL ) suppository 650 mg  650 mg Rectal Q6H PRN Laurita Manor DASEN, MD       aspirin  EC tablet 81 mg  81 mg Oral Daily Tariq, Hassan, MD   81 mg at 11/23/24 1020   bisacodyl  (DULCOLAX) EC tablet 5  mg  5 mg Oral Daily PRN Laurita Cort DASEN, MD       diclofenac  Sodium (VOLTAREN ) 1 % topical gel 2 g  2 g Topical QID Tariq, Hassan, MD       enoxaparin  (LOVENOX ) injection 40 mg  40 mg Subcutaneous Q24H Laurita Cort T, MD   40 mg at 11/22/24 1259   fluticasone  (FLONASE ) 50 MCG/ACT nasal spray 1-2 spray  1-2 spray Each Nare Daily PRN Laurita Cort DASEN, MD       metoprolol  succinate (TOPROL -XL) 24 hr tablet 12.5 mg  12.5 mg Oral Q breakfast Laurita Cort T, MD   12.5 mg at 11/23/24 0813   midodrine  (PROAMATINE ) tablet 5 mg  5 mg Oral BID Cosette Blackwater, MD       ondansetron  (ZOFRAN ) tablet 4 mg  4 mg Oral Q6H PRN Laurita Cort T, MD       Or    ondansetron  (ZOFRAN ) injection 4 mg  4 mg Intravenous Q6H PRN Laurita Cort T, MD       pantoprazole  (PROTONIX ) EC tablet 40 mg  40 mg Oral QPM Laurita Cort T, MD   40 mg at 11/23/24 1020   rivastigmine  (EXELON ) capsule 1.5 mg  1.5 mg Oral Daily Nazari, Walid A, RPH   1.5 mg at 11/23/24 1019   And   rivastigmine  (EXELON ) capsule 3 mg  3 mg Oral QHS Nazari, Walid A, RPH   3 mg at 11/22/24 2229   rosuvastatin  (CRESTOR ) tablet 20 mg  20 mg Oral Daily Laurita Cort T, MD   20 mg at 11/23/24 1020   senna-docusate (Senokot-S) tablet 1 tablet  1 tablet Oral QHS PRN Laurita Cort DASEN, MD       traZODone  (DESYREL ) tablet 25 mg  25 mg Oral QHS PRN Laurita Cort DASEN, MD       Current Outpatient Medications  Medication Sig Dispense Refill   aspirin  EC 81 MG tablet Take 81 mg by mouth daily.     B Complex Vitamins (VITAMIN B COMPLEX PO) Take 1 tablet by mouth daily.      Cholecalciferol  25 MCG (1000 UT) tablet Take 3,000 Units by mouth daily.     Coenzyme Q10 (COQ-10) 100 MG CAPS Take 100 mg by mouth every evening.     D-MANNOSE PO Take 600 mg by mouth in the morning and at bedtime.     ferrous gluconate  (FERGON) 240 (27 FE) MG tablet Take 240 mg by mouth daily.     midodrine  (PROAMATINE ) 5 MG tablet Take 5 mg by mouth 2 (two) times daily.     Multiple Vitamin (MULTIVITAMIN WITH MINERALS) TABS tablet Take 1 tablet by mouth every evening.     omeprazole (PRILOSEC OTC) 20 MG tablet Take 20 mg by mouth every evening.     rivastigmine  (EXELON ) 1.5 MG capsule Take 1.5-3 mg by mouth See admin instructions. Take 1 capsule (1.5mg ) by mouth every morning and take 2 capsules (3mg ) by mouth every night     rosuvastatin  (CRESTOR ) 20 MG tablet Take 20 mg by mouth daily.     amoxicillin -clavulanate (AUGMENTIN ) 875-125 MG tablet Take 1 tablet by mouth every 12 (twelve) hours. (Patient not taking: Reported on 11/22/2024) 8 tablet 0   apixaban  (ELIQUIS ) 5 MG TABS tablet Take 5 mg by mouth 2 (two) times daily.  (Patient not taking:  Reported on 11/22/2024)     fluticasone  (FLONASE ) 50 MCG/ACT nasal spray Place 1-2 sprays into both nostrils daily as needed (allergies.).  (  Patient not taking: Reported on 11/22/2024)     food thickener (THICK IT) POWD Thicken to nectar thick liquids 1020 g 0   metoprolol  succinate (TOPROL -XL) 25 MG 24 hr tablet Take 0.5 tablets (12.5 mg total) by mouth daily with breakfast. (Patient not taking: Reported on 11/22/2024) 30 tablet 0   midodrine  (PROAMATINE ) 2.5 MG tablet Take 2.5 mg by mouth 3 (three) times daily. (Patient not taking: Reported on 11/22/2024)     torsemide  (DEMADEX ) 20 MG tablet Take 20 mg by mouth every other day. (Patient not taking: Reported on 11/22/2024)       Discharge Medications: Please see discharge summary for a list of discharge medications.  Relevant Imaging Results:  Relevant Lab Results:   Additional Information [id number]  Laquana Villari L Nalini Alcaraz, LCSW     "

## 2024-11-23 NOTE — ED Notes (Signed)
Pt brief and bedding changed 

## 2024-11-23 NOTE — Progress Notes (Signed)
 " PROGRESS NOTE    Alexander Kline Alexander Kline.  FMW:[id number] DOB: 12/08/1940 DOA: 11/22/2024 PCP: Alexander Bail, MD  Subjective: No acute events overnight. Seen and examined at bedside with daughter. Reports feeling better and no pain while in bed. As per daughter, patient has high pain tolerance and only experiences pain with mobility. Denies nausea, vomiting, constipation.  Hospital Course:   84 y.o. 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  Lately about 6 weeks ago patient started to experience worsening of back pain and bilateral lower extremity weakness, right more than left.  Went to see neurology, who suspect patient has a worsening of herniated disc problems and a outpatient MRI was ordered.  Patient was also evaluated by PCP recently for increasing daytime sleepiness, who suspected that patient has a underlying OSA and a sleep study was scheduled in March.  Patient had a mechanical fall this morning when he slipped off a chair and hit his back.  Unable to stand up because of severe back pain.  Denies any cough or shortness of breath no chest pains, no urinary problems or diarrhea.  Denies any prodrome of lightheadedness blurry vision.  No head or neck injury, no LOC.  Admitted for acute on chronic back pain and acute debilitation.  Assessment and Plan:  Acute debilitation Mechanical Fall at home - Trauma scan negative - Likely secondary to a gradually worsening of chronic back problems/herniated lumbar spine discs - CK 137 - stop IV dilaudid  PRN - cont tylenol  PRN - start diclofenac  gel QID - can consider lidocaine  patch if pain  uncontrolled - can consider oral low dose opioids PRN if pain uncontolled - avoid IV pain meds if able to tolerate PO without n/v - PT OT following, needs SNF - Outpatient MRI as scheduled   Questional OSA - Check nocturnal pulse ox - TSH 1.83 - will need Outpatient sleep study scheduled for March   Elevated troponins Chronic HFrEF with recovered LVEF Hx of Hypotension - No chest pains, Euvolemic on exam - EKG with A-paced rhythm, Qtc 481 - Echo showed LVEF 35-40%, global hypokinesis, grade 3 diastolic dysfunction - cont on torsemide  Monday Wednesday Friday - Consider outpatient stress test. - will need to follow up with cardiology outpatient from drop in EF   Obesity - Estimated BMI> 30 - Calorie control recommended   Hx of embolic stroke - Off Eliquis  - Continue Statin  Pulmonary HTN - Other DDx, reveal patient cardiac and pulmonary history, in 2022 there was a note from pulmonary mentioned that patient has pulmonary hypertension however patient was never followed up with the usual.  At that point pulmonary recommended possible RHC.  - will need outpatient follow up for this   PPM - No acute concern  DVT prophylaxis: enoxaparin  (LOVENOX ) injection 40 mg Start: 11/22/24 1230  Lovenox    Code Status: Full Code Family Communication: updated daughter at bedside Disposition Plan: SNF Reason for continuing need for hospitalization: medically ready, needs SNF  Objective: Vitals:   11/23/24 0623 11/23/24 0710 11/23/24 0730 11/23/24 1015  BP: (!) 160/96  (!) 166/99 (!) 139/91  Pulse: 75  88 87  Resp: 18  18   Temp:  98.3 F (36.8 C)     TempSrc:      SpO2: 97% 100% 99% 99%    Intake/Output Summary (Last 24 hours) at 11/23/2024 1033 Last data filed at 11/22/2024 1119 Gross per 24 hour  Intake 1000 ml  Output --  Net 1000 ml   There were no vitals filed for this visit.  Examination:  Physical Exam Vitals and nursing note reviewed.  Constitutional:       General: He is not in acute distress.    Appearance: He is ill-appearing.     Comments: Weak, frail  HENT:     Head: Normocephalic and atraumatic.  Cardiovascular:     Rate and Rhythm: Normal rate and regular rhythm.     Pulses: Normal pulses.     Heart sounds: Normal heart sounds.  Pulmonary:     Effort: Pulmonary effort is normal.     Breath sounds: Normal breath sounds.  Abdominal:     General: Bowel sounds are normal.     Palpations: Abdomen is soft.  Neurological:     Mental Status: He is alert. Mental status is at baseline.     Data Reviewed: I have personally reviewed following labs and imaging studies  CBC: Recent Labs  Lab 11/22/24 0911  WBC 7.9  NEUTROABS 5.0  HGB 14.4  HCT 42.3  MCV 90.4  PLT 181   Basic Metabolic Panel: Recent Labs  Lab 11/22/24 0911  NA 142  K 3.8  CL 106  CO2 26  GLUCOSE 120*  BUN 19  CREATININE 1.09  CALCIUM  9.7   GFR: CrCl cannot be calculated (Unknown ideal weight.). Liver Function Tests: Recent Labs  Lab 11/22/24 0911  AST 35  ALT 23  ALKPHOS 91  BILITOT 0.5  PROT 6.8  ALBUMIN 3.7   Recent Labs  Lab 11/22/24 0911  LIPASE 10*   No results for input(s): AMMONIA in the last 168 hours. Coagulation Profile: No results for input(s): INR, PROTIME in the last 168 hours. Cardiac Enzymes: Recent Labs  Lab 11/22/24 0909  CKTOTAL 137   ProBNP, BNP (last 5 results) No results for input(s): PROBNP, BNP in the last 8760 hours. HbA1C: No results for input(s): HGBA1C in the last 72 hours. CBG: No results for input(s): GLUCAP in the last 168 hours. Lipid Profile: No results for input(s): CHOL, HDL, LDLCALC, TRIG, CHOLHDL, LDLDIRECT in the last 72 hours. Thyroid  Function Tests: Recent Labs    11/22/24 0909  TSH 1.830   Anemia Panel: No results for input(s): VITAMINB12, FOLATE, FERRITIN, TIBC, IRON , RETICCTPCT in the last 72 hours. Sepsis Labs: Recent Labs  Lab  11/22/24 0911  LATICACIDVEN 0.9    No results found for this or any previous visit (from the past 240 hours).   Radiology Studies: ECHOCARDIOGRAM COMPLETE Result Date: 11/22/2024    ECHOCARDIOGRAM REPORT   Patient Name:   Alexander Kline. Date of Exam: 11/22/2024 Medical Rec #:  [id number]          Height:       70.9 in Accession #:    [id number]         Weight:       242.5 lb Date of Birth:  Apr 24, 1941           BSA:          2.286 m Patient Age:    83 years           BP:  153/98 mmHg Patient Gender: M                  HR:           89 bpm. Exam Location:  [facility] Procedure: 2D Echo, 3D Echo, Color Doppler, Cardiac Doppler, Strain Analysis and            Intracardiac Opacification Agent (Both Spectral and Color Flow            Doppler were utilized during procedure). Indications:     Elevated Troponin  History:         Patient has prior history of Echocardiogram examinations, most                  recent 09/18/2020. Stroke; Risk Factors:Hypertension and                  Dyslipidemia.  Sonographer:     Logan Shove RDCS Referring Phys:  [id number] CORT ONEIDA MANA Diagnosing Phys: Cara JONETTA Lovelace MD IMPRESSIONS  1. Left ventricular ejection fraction, by estimation, is 35 to 40%. The left ventricle has mild to moderately decreased function. The left ventricle demonstrates global hypokinesis. Left ventricular diastolic parameters are consistent with Grade III diastolic dysfunction (restrictive). The average left ventricular global longitudinal strain is 11.6 %. The global longitudinal strain is abnormal.  2. Right ventricular systolic function is low normal. The right ventricular size is mildly enlarged.  3. The mitral valve is normal in structure. Mild mitral valve regurgitation.  4. The aortic valve is calcified. Aortic valve regurgitation is trivial. Aortic valve sclerosis is present, with no evidence of aortic valve stenosis. FINDINGS  Left Ventricle: Left ventricular ejection fraction, by estimation, is 35  to 40%. The left ventricle has mild to moderately decreased function. The left ventricle demonstrates global hypokinesis. Definity  contrast agent was given IV to delineate the left  ventricular endocardial borders. The average left ventricular global longitudinal strain is 11.6 %. Strain was performed and the global longitudinal strain is abnormal. The left ventricular internal cavity size was normal in size. There is no left ventricular hypertrophy. Left ventricular diastolic parameters are consistent with Grade III diastolic dysfunction (restrictive). Right Ventricle: The right ventricular size is mildly enlarged. No increase in right ventricular wall thickness. Right ventricular systolic function is low normal. Left Atrium: Left atrial size was normal in size. Right Atrium: Right atrial size was normal in size. Pericardium: There is no evidence of pericardial effusion. Mitral Valve: The mitral valve is normal in structure. Mild mitral valve regurgitation. Tricuspid Valve: The tricuspid valve is normal in structure. Tricuspid valve regurgitation is mild. Aortic Valve: The aortic valve is calcified. Aortic valve regurgitation is trivial. Aortic valve sclerosis is present, with no evidence of aortic valve stenosis. Pulmonic Valve: The pulmonic valve was not well visualized. Pulmonic valve regurgitation is not visualized. Aorta: The ascending aorta was not well visualized. IAS/Shunts: No atrial level shunt detected by color flow Doppler. Additional Comments: 3D was performed not requiring image post processing on an independent workstation and was abnormal. A device lead is visualized.  LEFT VENTRICLE PLAX 2D LVIDd:         4.60 cm      Diastology LVIDs:         3.70 cm      LV e' medial:    4.68 cm/s LV PW:         1.00 cm      LV E/e' medial:  21.6 LV IVS:  0.80 cm      LV e' lateral:   7.18 cm/s LVOT diam:     2.00 cm      LV E/e' lateral: 14.1 LV SV:         58 LV SV Index:   25           2D Longitudinal  Strain LVOT Area:     3.14 cm     2D Strain GLS (A4C):   11.9 %                             2D Strain GLS (A3C):   11.2 %                             2D Strain GLS (A2C):   11.6 % LV Volumes (MOD)            2D Strain GLS Avg:     11.6 % LV vol d, MOD A2C: 93.8 ml LV vol d, MOD A4C: 130.0 ml LV vol s, MOD A2C: 58.5 ml LV vol s, MOD A4C: 75.9 ml  3D Volume EF: LV SV MOD A2C:     35.3 ml  3D EF:        41 % LV SV MOD A4C:     130.0 ml LV EDV:       121 ml LV SV MOD BP:      43.0 ml  LV ESV:       72 ml                             LV SV:        49 ml RIGHT VENTRICLE            IVC RV Basal diam:  2.50 cm    IVC diam: 0.80 cm RV S prime:     8.38 cm/s TAPSE (M-mode): 1.4 cm LEFT ATRIUM             Index        RIGHT ATRIUM          Index LA diam:        3.20 cm 1.40 cm/m   RA Area:     9.99 cm LA Vol (A2C):   33.5 ml 14.66 ml/m  RA Volume:   19.20 ml 8.40 ml/m LA Vol (A4C):   45.9 ml 20.08 ml/m LA Biplane Vol: 40.6 ml 17.76 ml/m  AORTIC VALVE LVOT Vmax:   90.38 cm/s LVOT Vmean:  59.975 cm/s LVOT VTI:    0.184 m  AORTA Ao Root diam: 3.60 cm Ao Asc diam:  3.60 cm MITRAL VALVE                TRICUSPID VALVE MV Area (PHT): 6.12 cm     TR Peak grad:   31.6 mmHg MV Decel Time: 124 msec     TR Vmax:        281.00 cm/s MV E velocity: 101.00 cm/s MV A velocity: 39.90 cm/s   SHUNTS MV E/A ratio:  2.53         Systemic VTI:  0.18 m                             Systemic Diam: 2.00 cm Cara JONETTA Lovelace MD  Electronically signed by Cara JONETTA Lovelace MD Signature Date/Time: 11/22/2024/2:08:16 PM    Final    DG Chest 1 View Result Date: 11/22/2024 CLINICAL DATA:  Fall.  Weakness. EXAM: CHEST  1 VIEW COMPARISON:  04/13/2021 FINDINGS: The heart size and mediastinal contours are within normal limits. Pacemaker remains in place. Both lungs are clear. No evidence of pneumothorax or hemothorax. Internal fixation plate and screws are again seen in the left scapula. IMPRESSION: No acute findings or active lung disease. Electronically  Signed   By: Norleen DELENA Kil M.D.   On: 11/22/2024 09:34   DG Pelvis Portable Result Date: 11/22/2024 CLINICAL DATA:  Fall.  Pelvic pain. EXAM: PORTABLE PELVIS 1 VIEW COMPARISON:  None Available. FINDINGS: There is no evidence of pelvic fracture or diastasis. Right hip prosthesis and lower lumbar spine degenerative changes noted. Surgical clip seen in left lower quadrant. IMPRESSION: No evidence of pelvic fracture or other acute findings. Electronically Signed   By: Norleen DELENA Kil M.D.   On: 11/22/2024 09:32   CT Cervical Spine Wo Contrast Result Date: 11/22/2024 EXAM: CT CERVICAL SPINE WITHOUT CONTRAST 11/22/2024 08:45:19 AM TECHNIQUE: CT of the cervical spine was performed without the administration of intravenous contrast. Multiplanar reformatted images are provided for review. Automated exposure control, iterative reconstruction, and/or weight based adjustment of the mA/kV was utilized to reduce the radiation dose to as low as reasonably achievable. COMPARISON: CT head 11/22/2024 (reported separately). Prior cervical spine CT 09/17/2020. CLINICAL HISTORY: 84 year old male with neck trauma due to a fall from a chair. FINDINGS: BONES AND ALIGNMENT: Stable lordosis. No acute fracture or traumatic malalignment. DEGENERATIVE CHANGES: Chronic and mostly age appropriate cervical spine degeneration. SOFT TISSUES: Negative visible non-contrast thoracic inlet. No prevertebral soft tissue swelling. Prior cervical spine CT 09/17/2020 was negative for age non-contrast neck soft tissues. IMPRESSION: 1. No acute traumatic injury identified in the cervical spine. 2. Chronic cervical spine degeneration. Electronically signed by: Helayne Hurst MD 11/22/2024 09:03 AM EST RP Workstation: [id number]   CT Lumbar Spine Wo Contrast Result Date: 11/22/2024 EXAM: CT OF THE LUMBAR SPINE WITHOUT CONTRAST 11/22/2024 08:45:19 AM TECHNIQUE: CT of the lumbar spine was performed without the administration of intravenous contrast. Multiplanar  reformatted images are provided for review. Automated exposure control, iterative reconstruction, and/or weight based adjustment of the mA/kV was utilized to reduce the radiation dose to as low as reasonably achievable. COMPARISON: Lumbar MRI 04/20/2021. CLINICAL HISTORY: 84 year old male. Back trauma, no prior imaging. Fall from chair. FINDINGS: Normal lumbar segmentation, the same numbering system used on the previous MRI. BONES AND ALIGNMENT: Chronic T12 superior endplate Schmorl node, stable. Maintained lumbar vertebral height. Small chronic degenerative endplate Schmorl nodes at L3-L4 and L4-L5. Chronic vacuum disc at L3-L4 and L4-L5. Normal alignment except for chronic grade 1 spondylolisthesis at L3-L4 and L4-L5. Visible sacrum and SI joints appear intact with partial degenerative appearing SI joint fusion. No acute fracture or suspicious bone lesion. SOFT TISSUES: Mild to moderate calcified abdominal aortic atherosclerosis. Diverticulosis of the large bowel in the left lower quadrant. Negative non-contrast lumbar paraspinal soft tissues. DEGENERATIVE CHANGES: Stable lumbar spine degeneration from previous MRI, mild for age above the L3-L4 level. L3-L4 and L4-L5 chronic multifactorial spinal stenosis in the setting of mild grade 1 spondylolisthesis at each level. Mild to moderate spinal stenosis at L3-L4 and chronic severe spinal stenosis at L4-L5. Severe right L4 neural foraminal stenosis. IMPRESSION: 1. No acute traumatic injury identified in the lumbar spine. 2. Chronic lumbar spine degeneration Stable from  2022 MRI. 3. Aortic Atherosclerosis (ICD10-I70.0). Electronically signed by: Helayne Hurst MD 11/22/2024 08:57 AM EST RP Workstation: [id number]   CT Head Wo Contrast Result Date: 11/22/2024 EXAM: CT HEAD WITHOUT CONTRAST 11/22/2024 08:45:19 AM TECHNIQUE: CT of the head was performed without the administration of intravenous contrast. Automated exposure control, iterative reconstruction, and/or weight  based adjustment of the mA/kV was utilized to reduce the radiation dose to as low as reasonably achievable. COMPARISON: Brain MRI 09/17/2020, Head CT 09/24/2020. CLINICAL HISTORY: 84 year old male. Head trauma, minor (Age >= 65 years). Fall from chair. FINDINGS: BRAIN AND VENTRICLES: No acute hemorrhage. No evidence of acute infarct. No hydrocephalus. No extra-axial collection. No mass effect or midline shift. Brain volume is mildly decreased since 2021 in a generalized fashion. Ex vacuo appearing ventricular enlargement is very similar. Chronic periventricular white matter hypodensity is moderate and not significantly changed. No suspicious intracranial vascular hyperdensity. Mild for age calcified atherosclerosis at the skull base. ORBITS: No acute abnormality. SINUSES: No acute abnormality. SOFT TISSUES AND SKULL: No acute soft tissue abnormality. No skull fracture. IMPRESSION: 1. No acute traumatic injury identified.  No acute intracranial abnormality. Electronically signed by: Helayne Hurst MD 11/22/2024 08:51 AM EST RP Workstation: [id number]    Scheduled Meds:  aspirin  EC  81 mg Oral Daily   diclofenac  Sodium  2 g Topical QID   enoxaparin  (LOVENOX ) injection  40 mg Subcutaneous Q24H   metoprolol  succinate  12.5 mg Oral Q breakfast   midodrine   5 mg Oral BID   pantoprazole   40 mg Oral QPM   rivastigmine   1.5 mg Oral Daily   And   rivastigmine   3 mg Oral QHS   rosuvastatin   20 mg Oral Daily   Continuous Infusions:   LOS: 1 day   Norval Bar, MD  [facility]  11/23/2024, 10:33 AM   "

## 2024-11-23 NOTE — TOC Initial Note (Addendum)
 Transition of Care (TOC) - Initial/Assessment Note    Patient Details  Name: Alexander Kline. MRN: [id number] Date of Birth: 03-24-1941  Transition of Care [facility]) CM/SW Contact:    Donzell Coller L Solly Derasmo, LCSW Phone Number: 11/23/2024, 1:57 PM  Clinical Narrative:                  Several attempts to reach family via telephone to discuss Code 44 and recommendations for SNF.   CSW spoke with spouse who was in agreement to SNF recommendations but deferred all decision making to daughter, which CSW was not able to get on the phone.   HIPAA compliant voicemail left on daughter voicemail.   2:16pm: CSW received a call back from [place], patient daughter. Code 44 reviewed. Daughter requested document to be e-mailed.   SNF recommendations discussed. Daughter advised that patient is having memory issues and considered memory care. She reported having a conversation with [facility]. Preference for [facility]. If [facility] offers a bed she would like to accept.   FL2 will be completed.        Patient Goals and CMS Choice            Expected Discharge Plan and Services                                              Prior Living Arrangements/Services                       Activities of Daily Living      Permission Sought/Granted                  Emotional Assessment              Admission diagnosis:  Fall [W19.XXXA] Patient Active Problem List   Diagnosis Date Noted   Fall at home, initial encounter 11/22/2024   Fall 11/22/2024   Acute respiratory failure (HCC) 04/13/2021   CKD stage 3 due to type 2 diabetes mellitus (HCC) 04/13/2021   Acute metabolic encephalopathy 04/13/2021   Sepsis (HCC) 04/13/2021   CAP (community acquired pneumonia) 04/13/2021   Chronic diastolic CHF (congestive heart failure) (HCC) 04/13/2021   Bradycardia 09/25/2020   Fever 09/24/2020   Bradycardia, unspecified 09/24/2020   Pre-diabetes    Hypokalemia     Mild protein malnutrition    Grade I diastolic dysfunction    Hyperlipidemia    Altered mental status    TIA (transient ischemic attack) 09/17/2020   Abnormal SPEP 06/23/2020   Primary hypertension 06/23/2020   Sinus bradycardia 06/22/2020   Premature ventricular contractions 06/22/2020   Heart palpitations 06/22/2020   Dizziness 06/22/2020   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 [place] [address] Phone: [phone] Fax: [phone]     Social Drivers of Health (SDOH) Social History: SDOH Screenings   Food Insecurity: No Food Insecurity (09/11/2024)   Received from [facility]  Housing: Low Risk  (09/11/2024)   Received  from [facility]  Transportation Needs: No Transportation Needs (09/11/2024)   Received [facility]  Utilities: Not At Risk (09/11/2024)   Received from [facility]  Financial Resource Strain: Low Risk  (09/11/2024)   Received from [facility]  Tobacco Use: Low Risk (11/22/2024)   SDOH Interventions:     Readmission Risk Interventions     No data to display

## 2024-11-23 NOTE — ED Notes (Signed)
 Assisted pt to use urinal in bed.  Pt unable to urinate.  Changed pt brief, provided pericare, repositioned in bed, warm blankets provided.

## 2024-11-24 DIAGNOSIS — R5381 Other malaise: Secondary | ICD-10-CM | POA: Diagnosis not present

## 2024-11-24 DIAGNOSIS — W19XXXA Unspecified fall, initial encounter: Secondary | ICD-10-CM | POA: Diagnosis not present

## 2024-11-24 MED ORDER — POLYETHYLENE GLYCOL 3350 17 G PO PACK: 17.0000 g | PACK | Freq: Every day | ORAL | Status: DC | PRN

## 2024-11-24 MED ORDER — SENNA 8.6 MG PO TABS
1.0000 | ORAL_TABLET | Freq: Two times a day (BID) | ORAL | Status: DC
  Filled 2024-11-24 (×3): qty 1

## 2024-11-24 NOTE — TOC Progression Note (Signed)
 Transition of Care (TOC) - Progression Note    Patient Details  Name: Alexander Kline. MRN: [id number] Date of Birth: [birth year]  Transition of Care [facility]) CM/SW Contact  Shasta DELENA Daring, RN Phone Number: 11/24/2024, 2:22 PM  Clinical Narrative:    Notified patient's daughter that he received a bed offer from [facility]. She accepted the bed. Advised he will likely be discharge tomorrow and that we will remain in touch.                       Expected Discharge Plan and Services                                               Social Drivers of Health (SDOH) Interventions SDOH Screenings   Food Insecurity: No Food Insecurity (11/24/2024)  Housing: Low Risk (11/24/2024)  Transportation Needs: No Transportation Needs (11/24/2024)  Utilities: Not At Risk (11/24/2024)  Financial Resource Strain: Low Risk  (09/11/2024)   Received from [facility]  Social Connections: Moderately Isolated (11/24/2024)  Tobacco Use: Low Risk (11/22/2024)    Readmission Risk Interventions     No data to display

## 2024-11-24 NOTE — Discharge Summary (Signed)
 " [facility] Phys[facility] Discharge Summary   Patient name: Alexander Kline.  Admit date:     11/22/2024  Discharge date: 11/26/2024  Attending Physician: LAURITA CORT DASEN [[id number]]  Discharge Physician: Elvan Sor   PCP: Sherial Bail, MD  Admitted From: Home  Disposition:  [facility]  Recommendations for Outpatient Follow-up:  Follow up with PCP in 1-2 weeks Follow up for outpatient MRI as scheduled Follow up with neurology as scheduled  Home Health:No Equipment/Devices: @ECDMELIST @  Discharge Condition:Stable CODE STATUS:FULL Diet recommendation: Heart Healthy Fluid Restriction: None   1/28 DC summary was done yesterday by my colleague, patient could not go to SNF as bed was not available.  As per TOC bed is available today and patient can be discharged to skilled nursing facility today. Patient was lying comfortably, having hiccups, started Thorazine  as needed.  Watch for any side effects and sedation. Patient's daughter was concerned about autoimmune workup, it is worth referring to neurology as an outpatient for Myasthenia gravis, NCS and EMG as an outpatient. For CHF patient's blood pressure is fluctuating, changed midodrine  to as needed, continued Toprol -XL at current dose and recommend to follow-up with cardiology for GDMT.   Below is the hospital course dictated by my colleague.  Please review detailed prior notes.  Hospital Summary:  84 y.o. 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 Lately about 6 weeks ago patient started to experience worsening of back pain and bilateral lower extremity weakness, right  more than left.  Went to see neurology, who suspected patient had a worsening of herniated disc problems and a outpatient MRI was ordered.  Patient was also evaluated by PCP recently for increasing daytime sleepiness, who suspected that patient has a underlying OSA and a sleep study was scheduled in March.   Patient had a mechanical fall morning of admission when he slipped off a chair and hit his back. Unable to stand up because of severe back pain.  Denies any cough or shortness of breath no chest pains, no urinary problems or diarrhea.  Denies any prodrome of lightheadedness blurry vision.  No head or neck injury, no LOC. Presented to hospital for further evaluation   Acute on chronic back pain Acute debilitation Radiographic imaging as part of Trauma scan negative for any acute pathology. Pain controlled with tylenol  PRN, and diclofenac  gel QID. Did not require any opioids. Seen by PT during admission. Stable for discharge at this time to SNF.  - will need to follow up for outpatient MRI as scheduled - will need to follow up with PCP/neurology as scheduled.  Elevated troponins Chronic HFrEF with previously recovered LVEF Hx of Hypotension Patient found to have elevated troponins in the absence of chest pains and appeared euvolemic on exam. EKG with A-paced rhythm, Qtc 481. Echo showed LVEF 35-40%, global hypokinesis, grade 3 diastolic dysfunction. AS per chart review, patient was seen by pulmonology also in 2022 for concerns of pulmonary HTN and advised to get a RHC but seems patient was lost to follow up. - may cont on torsemide  Monday Wednesday Friday, if volume overload - Consider outpatient stress test. - will need to follow up with cardiology outpatient  from drop in EF and related workup  Discharge Diagnoses:  Principal Problem:   Fall Active Problems:   Fall at home, initial encounter   Discharge Instructions  Discharge Instructions     Discharge instructions   Complete by: As  directed    Follow-up with PCP, patient needs to be seen by an MD in 1 to 2 days, monitor BP and titrate medication accordingly.  Changed to midodrine  to as needed if systolic BP less than 100 mmHg. Follow-up with Neurology for autoimmune workup, patient may benefit from workup of Myasthenia gravis and EMG and NCS as an outpatient.   Increase activity slowly   Complete by: As directed       Allergies as of 11/26/2024       Reactions   Sulfa Antibiotics    Per pt, he gets bad headaches with sulfa drugs        Medication List     STOP taking these medications    amoxicillin -clavulanate 875-125 MG tablet Commonly known as: AUGMENTIN    Eliquis  5 MG Tabs tablet Generic drug: apixaban    fluticasone  50 MCG/ACT nasal spray Commonly known as: FLONASE    torsemide  20 MG tablet Commonly known as: DEMADEX        TAKE these medications    aspirin  EC 81 MG tablet Take 81 mg by mouth daily.   chlorproMAZINE  25 MG tablet Commonly known as: THORAZINE  Take 1 tablet (25 mg total) by mouth 3 (three) times daily as needed for hiccoughs. Please hold off if patient is sleepy and drowsy   Cholecalciferol  25 MCG (1000 UT) tablet Take 3,000 Units by mouth daily.   CoQ-10 100 MG Caps Take 100 mg by mouth every evening.   D-MANNOSE PO Take 600 mg by mouth in the morning and at bedtime.   diclofenac  Sodium 1 % Gel Commonly known as: VOLTAREN  Apply 2 g topically 4 (four) times daily.   ferrous gluconate  240 (27 FE) MG tablet Commonly known as: FERGON Take 240 mg by mouth daily.   food thickener Powd Commonly known as: THICK IT Thicken to nectar thick liquids   metoprolol  succinate 25 MG 24 hr tablet Commonly known as: TOPROL -XL Take 0.5 tablets (12.5 mg total) by mouth daily with breakfast.   midodrine  5 MG tablet Commonly known as: PROAMATINE  Take 1 tablet (5 mg total) by mouth 3 (three) times daily as needed (if systolic BP less than 100). What changed:  when to take  this reasons to take this Another medication with the same name was removed. Continue taking this medication, and follow the directions you see here.   multivitamin with minerals Tabs tablet Take 1 tablet by mouth every evening.   omeprazole 20 MG tablet Commonly known as: PRILOSEC OTC Take 20 mg by mouth every evening.   rivastigmine  1.5 MG capsule Commonly known as: EXELON  Take 1.5-3 mg by mouth See admin instructions. Take 1 capsule (1.5mg ) by mouth every morning and take 2 capsules (3mg ) by mouth every night   rosuvastatin  20 MG tablet Commonly known as: CRESTOR  Take 20 mg by mouth daily.   VITAMIN B COMPLEX PO Take 1 tablet by mouth daily.        Contact information for after-discharge care     Destination     [facility] .   Service: Skilled Nursing Contact information: [address] [place]  [id number] [phone]  Allergies[1]  Discharge Exam: Vitals:   11/26/24 0440 11/26/24 0819  BP: (!) 142/89 (!) 140/86  Pulse: 88 88  Resp: 20   Temp: 98.6 F (37 C) 98.1 F (36.7 C)  SpO2: 96% 94%    Physical Exam Vitals and nursing note reviewed.  Constitutional:      General: He is not in acute distress.    Appearance: He is ill-appearing.     Comments: Weak, frail  HENT:     Head: Normocephalic and atraumatic.  Cardiovascular:     Rate and Rhythm: Normal rate and regular rhythm.     Pulses: Normal pulses.     Heart sounds: Normal heart sounds.  Pulmonary:     Effort: Pulmonary effort is normal.     Breath sounds: Normal breath sounds.  Abdominal:     General: Bowel sounds are normal.     Palpations: Abdomen is soft.  Neurological:     Mental Status: He is alert.     The results of significant diagnostics from this hospitalization (including imaging, microbiology, ancillary and laboratory) are listed below for reference.    Microbiology: No results found for this or any previous  visit (from the past 240 hours).   Labs: ProBNP, BNP (last 5 results) No results for input(s): PROBNP, BNP in the last 8760 hours. Basic Metabolic Panel: Recent Labs  Lab 11/22/24 0911  NA 142  K 3.8  CL 106  CO2 26  GLUCOSE 120*  BUN 19  CREATININE 1.09  CALCIUM  9.7   Liver Function Tests: Recent Labs  Lab 11/22/24 0911  AST 35  ALT 23  ALKPHOS 91  BILITOT 0.5  PROT 6.8  ALBUMIN 3.7   Recent Labs  Lab 11/22/24 0911  LIPASE 10*   No results for input(s): AMMONIA in the last 168 hours. CBC: Recent Labs  Lab 11/22/24 0911  WBC 7.9  NEUTROABS 5.0  HGB 14.4  HCT 42.3  MCV 90.4  PLT 181   Cardiac Enzymes: Recent Labs  Lab 11/22/24 0909  CKTOTAL 137   BNP: No results for input(s): BNP in the last 168 hours. CBG: No results for input(s): GLUCAP in the last 168 hours. D-Dimer No results for input(s): DDIMER in the last 72 hours. Hgb A1c No results for input(s): HGBA1C in the last 72 hours. Lipid Profile No results for input(s): CHOL, HDL, LDLCALC, TRIG, CHOLHDL, LDLDIRECT in the last 72 hours. Thyroid  function studies No results for input(s): TSH, T4TOTAL, FREET4, T3FREE, THYROIDAB in the last 72 hours.  Invalid input(s): FREET3  Anemia work up No results for input(s): VITAMINB12, FOLATE, FERRITIN, TIBC, IRON , RETICCTPCT in the last 72 hours. Urinalysis    Component Value Date/Time   COLORURINE AMBER (A) 11/22/2024 0823   APPEARANCEUR HAZY (A) 11/22/2024 0823   LABSPEC 1.026 11/22/2024 0823   PHURINE 5.0 11/22/2024 0823   GLUCOSEU NEGATIVE 11/22/2024 0823   HGBUR SMALL (A) 11/22/2024 0823   BILIRUBINUR NEGATIVE 11/22/2024 0823   KETONESUR NEGATIVE 11/22/2024 0823   PROTEINUR 30 (A) 11/22/2024 0823   NITRITE NEGATIVE 11/22/2024 0823   LEUKOCYTESUR NEGATIVE 11/22/2024 0823   Sepsis Labs Recent Labs  Lab 11/22/24 0911  WBC 7.9    Procedures/Studies: ECHOCARDIOGRAM COMPLETE Result Date:  11/22/2024    ECHOCARDIOGRAM REPORT   Patient Name:   Justino Boze. Date of Exam: 11/22/2024 Medical Rec #:  [id number]          Height:       70.9 in Accession #:  [id number]         Weight:       242.5 lb Date of Birth:  1940/11/01           BSA:          2.286 m Patient Age:    83 years           BP:           153/98 mmHg Patient Gender: M                  HR:           89 bpm. Exam Location:  [facility] Procedure: 2D Echo, 3D Echo, Color Doppler, Cardiac Doppler, Strain Analysis and            Intracardiac Opacification Agent (Both Spectral and Color Flow            Doppler were utilized during procedure). Indications:     Elevated Troponin  History:         Patient has prior history of Echocardiogram examinations, most                  recent 09/18/2020. Stroke; Risk Factors:Hypertension and                  Dyslipidemia.  Sonographer:     Logan Shove RDCS Referring Phys:  [id number] CORT ONEIDA MANA Diagnosing Phys: Cara JONETTA Lovelace MD IMPRESSIONS  1. Left ventricular ejection fraction, by estimation, is 35 to 40%. The left ventricle has mild to moderately decreased function. The left ventricle demonstrates global hypokinesis. Left ventricular diastolic parameters are consistent with Grade III diastolic dysfunction (restrictive). The average left ventricular global longitudinal strain is 11.6 %. The global longitudinal strain is abnormal.  2. Right ventricular systolic function is low normal. The right ventricular size is mildly enlarged.  3. The mitral valve is normal in structure. Mild mitral valve regurgitation.  4. The aortic valve is calcified. Aortic valve regurgitation is trivial. Aortic valve sclerosis is present, with no evidence of aortic valve stenosis. FINDINGS  Left Ventricle: Left ventricular ejection fraction, by estimation, is 35 to 40%. The left ventricle has mild to moderately decreased function. The left ventricle demonstrates global hypokinesis. Definity  contrast agent was given IV to delineate the  left  ventricular endocardial borders. The average left ventricular global longitudinal strain is 11.6 %. Strain was performed and the global longitudinal strain is abnormal. The left ventricular internal cavity size was normal in size. There is no left ventricular hypertrophy. Left ventricular diastolic parameters are consistent with Grade III diastolic dysfunction (restrictive). Right Ventricle: The right ventricular size is mildly enlarged. No increase in right ventricular wall thickness. Right ventricular systolic function is low normal. Left Atrium: Left atrial size was normal in size. Right Atrium: Right atrial size was normal in size. Pericardium: There is no evidence of pericardial effusion. Mitral Valve: The mitral valve is normal in structure. Mild mitral valve regurgitation. Tricuspid Valve: The tricuspid valve is normal in structure. Tricuspid valve regurgitation is mild. Aortic Valve: The aortic valve is calcified. Aortic valve regurgitation is trivial. Aortic valve sclerosis is present, with no evidence of aortic valve stenosis. Pulmonic Valve: The pulmonic valve was not well visualized. Pulmonic valve regurgitation is not visualized. Aorta: The ascending aorta was not well visualized. IAS/Shunts: No atrial level shunt detected by color flow Doppler. Additional Comments: 3D was performed not requiring image post processing on an independent workstation and was abnormal. A device  lead is visualized.  LEFT VENTRICLE PLAX 2D LVIDd:         4.60 cm      Diastology LVIDs:         3.70 cm      LV e' medial:    4.68 cm/s LV PW:         1.00 cm      LV E/e' medial:  21.6 LV IVS:        0.80 cm      LV e' lateral:   7.18 cm/s LVOT diam:     2.00 cm      LV E/e' lateral: 14.1 LV SV:         58 LV SV Index:   25           2D Longitudinal Strain LVOT Area:     3.14 cm     2D Strain GLS (A4C):   11.9 %                             2D Strain GLS (A3C):   11.2 %                             2D Strain GLS (A2C):   11.6  % LV Volumes (MOD)            2D Strain GLS Avg:     11.6 % LV vol d, MOD A2C: 93.8 ml LV vol d, MOD A4C: 130.0 ml LV vol s, MOD A2C: 58.5 ml LV vol s, MOD A4C: 75.9 ml  3D Volume EF: LV SV MOD A2C:     35.3 ml  3D EF:        41 % LV SV MOD A4C:     130.0 ml LV EDV:       121 ml LV SV MOD BP:      43.0 ml  LV ESV:       72 ml                             LV SV:        49 ml RIGHT VENTRICLE            IVC RV Basal diam:  2.50 cm    IVC diam: 0.80 cm RV S prime:     8.38 cm/s TAPSE (M-mode): 1.4 cm LEFT ATRIUM             Index        RIGHT ATRIUM          Index LA diam:        3.20 cm 1.40 cm/m   RA Area:     9.99 cm LA Vol (A2C):   33.5 ml 14.66 ml/m  RA Volume:   19.20 ml 8.40 ml/m LA Vol (A4C):   45.9 ml 20.08 ml/m LA Biplane Vol: 40.6 ml 17.76 ml/m  AORTIC VALVE LVOT Vmax:   90.38 cm/s LVOT Vmean:  59.975 cm/s LVOT VTI:    0.184 m  AORTA Ao Root diam: 3.60 cm Ao Asc diam:  3.60 cm MITRAL VALVE                TRICUSPID VALVE MV Area (PHT): 6.12 cm     TR Peak grad:   31.6 mmHg MV Decel Time: 124 msec  TR Vmax:        281.00 cm/s MV E velocity: 101.00 cm/s MV A velocity: 39.90 cm/s   SHUNTS MV E/A ratio:  2.53         Systemic VTI:  0.18 m                             Systemic Diam: 2.00 cm Cara JONETTA Lovelace MD Electronically signed by Cara JONETTA Lovelace MD Signature Date/Time: 11/22/2024/2:08:16 PM    Final    DG Chest 1 View Result Date: 11/22/2024 CLINICAL DATA:  Fall.  Weakness. EXAM: CHEST  1 VIEW COMPARISON:  04/13/2021 FINDINGS: The heart size and mediastinal contours are within normal limits. Pacemaker remains in place. Both lungs are clear. No evidence of pneumothorax or hemothorax. Internal fixation plate and screws are again seen in the left scapula. IMPRESSION: No acute findings or active lung disease. Electronically Signed   By: Norleen DELENA Kil M.D.   On: 11/22/2024 09:34   DG Pelvis Portable Result Date: 11/22/2024 CLINICAL DATA:  Fall.  Pelvic pain. EXAM: PORTABLE PELVIS 1 VIEW COMPARISON:   None Available. FINDINGS: There is no evidence of pelvic fracture or diastasis. Right hip prosthesis and lower lumbar spine degenerative changes noted. Surgical clip seen in left lower quadrant. IMPRESSION: No evidence of pelvic fracture or other acute findings. Electronically Signed   By: Norleen DELENA Kil M.D.   On: 11/22/2024 09:32   CT Cervical Spine Wo Contrast Result Date: 11/22/2024 EXAM: CT CERVICAL SPINE WITHOUT CONTRAST 11/22/2024 08:45:19 AM TECHNIQUE: CT of the cervical spine was performed without the administration of intravenous contrast. Multiplanar reformatted images are provided for review. Automated exposure control, iterative reconstruction, and/or weight based adjustment of the mA/kV was utilized to reduce the radiation dose to as low as reasonably achievable. COMPARISON: CT head 11/22/2024 (reported separately). Prior cervical spine CT 09/17/2020. CLINICAL HISTORY: 84 year old male with neck trauma due to a fall from a chair. FINDINGS: BONES AND ALIGNMENT: Stable lordosis. No acute fracture or traumatic malalignment. DEGENERATIVE CHANGES: Chronic and mostly age appropriate cervical spine degeneration. SOFT TISSUES: Negative visible non-contrast thoracic inlet. No prevertebral soft tissue swelling. Prior cervical spine CT 09/17/2020 was negative for age non-contrast neck soft tissues. IMPRESSION: 1. No acute traumatic injury identified in the cervical spine. 2. Chronic cervical spine degeneration. Electronically signed by: Helayne Hurst MD 11/22/2024 09:03 AM EST RP Workstation: [id number]   CT Lumbar Spine Wo Contrast Result Date: 11/22/2024 EXAM: CT OF THE LUMBAR SPINE WITHOUT CONTRAST 11/22/2024 08:45:19 AM TECHNIQUE: CT of the lumbar spine was performed without the administration of intravenous contrast. Multiplanar reformatted images are provided for review. Automated exposure control, iterative reconstruction, and/or weight based adjustment of the mA/kV was utilized to reduce the radiation  dose to as low as reasonably achievable. COMPARISON: Lumbar MRI 04/20/2021. CLINICAL HISTORY: 84 year old male. Back trauma, no prior imaging. Fall from chair. FINDINGS: Normal lumbar segmentation, the same numbering system used on the previous MRI. BONES AND ALIGNMENT: Chronic T12 superior endplate Schmorl node, stable. Maintained lumbar vertebral height. Small chronic degenerative endplate Schmorl nodes at L3-L4 and L4-L5. Chronic vacuum disc at L3-L4 and L4-L5. Normal alignment except for chronic grade 1 spondylolisthesis at L3-L4 and L4-L5. Visible sacrum and SI joints appear intact with partial degenerative appearing SI joint fusion. No acute fracture or suspicious bone lesion. SOFT TISSUES: Mild to moderate calcified abdominal aortic atherosclerosis. Diverticulosis of the large bowel in the left lower quadrant.  Negative non-contrast lumbar paraspinal soft tissues. DEGENERATIVE CHANGES: Stable lumbar spine degeneration from previous MRI, mild for age above the L3-L4 level. L3-L4 and L4-L5 chronic multifactorial spinal stenosis in the setting of mild grade 1 spondylolisthesis at each level. Mild to moderate spinal stenosis at L3-L4 and chronic severe spinal stenosis at L4-L5. Severe right L4 neural foraminal stenosis. IMPRESSION: 1. No acute traumatic injury identified in the lumbar spine. 2. Chronic lumbar spine degeneration Stable from 2022 MRI. 3. Aortic Atherosclerosis (ICD10-I70.0). Electronically signed by: Helayne Hurst MD 11/22/2024 08:57 AM EST RP Workstation: [id number]   CT Head Wo Contrast Result Date: 11/22/2024 EXAM: CT HEAD WITHOUT CONTRAST 11/22/2024 08:45:19 AM TECHNIQUE: CT of the head was performed without the administration of intravenous contrast. Automated exposure control, iterative reconstruction, and/or weight based adjustment of the mA/kV was utilized to reduce the radiation dose to as low as reasonably achievable. COMPARISON: Brain MRI 09/17/2020, Head CT 09/24/2020. CLINICAL HISTORY:  84 year old male. Head trauma, minor (Age >= 65 years). Fall from chair. FINDINGS: BRAIN AND VENTRICLES: No acute hemorrhage. No evidence of acute infarct. No hydrocephalus. No extra-axial collection. No mass effect or midline shift. Brain volume is mildly decreased since 2021 in a generalized fashion. Ex vacuo appearing ventricular enlargement is very similar. Chronic periventricular white matter hypodensity is moderate and not significantly changed. No suspicious intracranial vascular hyperdensity. Mild for age calcified atherosclerosis at the skull base. ORBITS: No acute abnormality. SINUSES: No acute abnormality. SOFT TISSUES AND SKULL: No acute soft tissue abnormality. No skull fracture. IMPRESSION: 1. No acute traumatic injury identified.  No acute intracranial abnormality. Electronically signed by: Helayne Hurst MD 11/22/2024 08:51 AM EST RP Workstation: [id number]    Time coordinating discharge: 40 mins  SIGNED:  Elvan Sor, MD [facility] 11/26/24, 11:09 AM     [1]  Allergies Allergen Reactions   Sulfa Antibiotics     Per pt, he gets bad headaches with sulfa drugs   "

## 2024-11-24 NOTE — Plan of Care (Signed)

## 2024-11-24 NOTE — Progress Notes (Signed)
 Heart Failure Navigator Progress Note  Assessed for Heart & Vascular TOC clinic readiness.  Patient does not meet criteria due to current [facility] patient.   Navigator will sign off at this time.  Roxy Horseman, RN, BSN [facility] Heart Failure Navigator Secure Chat Only

## 2024-11-24 NOTE — Progress Notes (Signed)
 " PROGRESS NOTE    Alexander Kline Alexander Kline.  FMW:[id number] DOB: 17-Mar-1941 DOA: 11/22/2024 PCP: Alexander Bail, MD  Subjective: No acute events overnight. Seen and examined at bedside with daughter present. Reports pain controlled. Tolerating oral intake without n/v. Reports no BM since admission but doesn't feel constipated.   Hospital Course:  84 y.o. 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  Lately about 6 weeks ago patient started to experience worsening of back pain and bilateral lower extremity weakness, right more than left.  Went to see neurology, who suspect patient has a worsening of herniated disc problems and a outpatient MRI was ordered.  Patient was also evaluated by PCP recently for increasing daytime sleepiness, who suspected that patient has a underlying OSA and a sleep study was scheduled in March.  Patient had a mechanical fall this morning when he slipped off a chair and hit his back.  Unable to stand up because of severe back pain.  Denies any cough or shortness of breath no chest pains, no urinary problems or diarrhea.  Denies any prodrome of lightheadedness blurry vision.  No head or neck injury, no LOC.   Admitted for acute on chronic back pain and acute debilitation.   Assessment and Plan:  Acute debilitation Mechanical Fall at home - Trauma scan negative - Likely secondary to a gradually worsening of chronic back problems/herniated lumbar spine discs - CK 137 - stopped IV dilaudid  PRN - cont tylenol  PRN - cont diclofenac  gel QID - can consider lidocaine  patch if pain uncontrolled - can consider oral low dose opioids PRN if  pain uncontolled - avoid IV pain meds if able to tolerate PO without n/v - PT OT following, needs SNF - Outpatient MRI as scheduled   Questional OSA - Check nocturnal pulse ox - TSH 1.83 - will need Outpatient sleep study scheduled for March   Elevated troponins Chronic HFrEF with previously recovered LVEF Hx of Hypotension - No chest pains, euvolemic on exam - EKG with A-paced rhythm, Qtc 481 - Echo showed LVEF 35-40%, global hypokinesis, grade 3 diastolic dysfunction - cont on torsemide  Monday Wednesday Friday - Consider outpatient stress test. - will need to follow up with cardiology outpatient from drop in EF   Obesity - Estimated BMI> 30 - Calorie control recommended   Hx of embolic stroke - Off Eliquis  - Continue Statin   Pulmonary HTN - Other DDx, reveal patient cardiac and pulmonary history, in 2022 there was a note from pulmonary mentioned that patient has pulmonary hypertension however patient was never followed up with the usual.  At that point pulmonary recommended possible RHC.  - will need outpatient follow up for this   PPM - No acute concern  DVT prophylaxis: enoxaparin  (LOVENOX ) injection 40 mg Start: 11/22/24 1230  Lovenox    Code Status: Full Code Family Communication: updated daughter at bedside Disposition Plan: SNF Reason for continuing need for hospitalization: medically ready, needs SNF   Objective: Vitals:   11/24/24 0830 11/24/24 0900 11/24/24 0908 11/24/24 0952  BP: (!) 136/115 120/77 120/77 (!) 140/88  Pulse:   88 89  Resp:    17  Temp:    98.6 F (  37 C)  TempSrc:      SpO2: 100% 100%  97%   No intake or output data in the 24 hours ending 11/24/24 1040 There were no vitals filed for this visit.  Examination:  Physical Exam Vitals and nursing note reviewed.  Constitutional:      General: He is not in acute distress.    Appearance: He is ill-appearing.     Comments: Weak, frail  HENT:     Head: Normocephalic and atraumatic.   Cardiovascular:     Rate and Rhythm: Normal rate and regular rhythm.     Pulses: Normal pulses.     Heart sounds: Normal heart sounds.  Pulmonary:     Effort: Pulmonary effort is normal.     Breath sounds: Normal breath sounds.  Abdominal:     General: Bowel sounds are normal.     Palpations: Abdomen is soft.  Neurological:     Mental Status: He is alert.     Data Reviewed: I have personally reviewed following labs and imaging studies  CBC: Recent Labs  Lab 11/22/24 0911  WBC 7.9  NEUTROABS 5.0  HGB 14.4  HCT 42.3  MCV 90.4  PLT 181   Basic Metabolic Panel: Recent Labs  Lab 11/22/24 0911  NA 142  K 3.8  CL 106  CO2 26  GLUCOSE 120*  BUN 19  CREATININE 1.09  CALCIUM  9.7   GFR: CrCl cannot be calculated (Unknown ideal weight.). Liver Function Tests: Recent Labs  Lab 11/22/24 0911  AST 35  ALT 23  ALKPHOS 91  BILITOT 0.5  PROT 6.8  ALBUMIN 3.7   Recent Labs  Lab 11/22/24 0911  LIPASE 10*   No results for input(s): AMMONIA in the last 168 hours. Coagulation Profile: No results for input(s): INR, PROTIME in the last 168 hours. Cardiac Enzymes: Recent Labs  Lab 11/22/24 0909  CKTOTAL 137   ProBNP, BNP (last 5 results) No results for input(s): PROBNP, BNP in the last 8760 hours. HbA1C: No results for input(s): HGBA1C in the last 72 hours. CBG: No results for input(s): GLUCAP in the last 168 hours. Lipid Profile: No results for input(s): CHOL, HDL, LDLCALC, TRIG, CHOLHDL, LDLDIRECT in the last 72 hours. Thyroid  Function Tests: Recent Labs    11/22/24 0909  TSH 1.830   Anemia Panel: No results for input(s): VITAMINB12, FOLATE, FERRITIN, TIBC, IRON , RETICCTPCT in the last 72 hours. Sepsis Labs: Recent Labs  Lab 11/22/24 0911  LATICACIDVEN 0.9    No results found for this or any previous visit (from the past 240 hours).   Radiology Studies: ECHOCARDIOGRAM COMPLETE Result Date: 11/22/2024     ECHOCARDIOGRAM REPORT   Patient Name:   Alexander Kline. Date of Exam: 11/22/2024 Medical Rec #:  [id number]          Height:       70.9 in Accession #:    [id number]         Weight:       242.5 lb Date of Birth:  15-Oct-1941           BSA:          2.286 m Patient Age:    83 years           BP:           153/98 mmHg Patient Gender: M                  HR:  89 bpm. Exam Location:  [facility] Procedure: 2D Echo, 3D Echo, Color Doppler, Cardiac Doppler, Strain Analysis and            Intracardiac Opacification Agent (Both Spectral and Color Flow            Doppler were utilized during procedure). Indications:     Elevated Troponin  History:         Patient has prior history of Echocardiogram examinations, most                  recent 09/18/2020. Stroke; Risk Factors:Hypertension and                  Dyslipidemia.  Sonographer:     Logan Shove RDCS Referring Phys:  [id number] CORT ONEIDA MANA Diagnosing Phys: Cara JONETTA Lovelace MD IMPRESSIONS  1. Left ventricular ejection fraction, by estimation, is 35 to 40%. The left ventricle has mild to moderately decreased function. The left ventricle demonstrates global hypokinesis. Left ventricular diastolic parameters are consistent with Grade III diastolic dysfunction (restrictive). The average left ventricular global longitudinal strain is 11.6 %. The global longitudinal strain is abnormal.  2. Right ventricular systolic function is low normal. The right ventricular size is mildly enlarged.  3. The mitral valve is normal in structure. Mild mitral valve regurgitation.  4. The aortic valve is calcified. Aortic valve regurgitation is trivial. Aortic valve sclerosis is present, with no evidence of aortic valve stenosis. FINDINGS  Left Ventricle: Left ventricular ejection fraction, by estimation, is 35 to 40%. The left ventricle has mild to moderately decreased function. The left ventricle demonstrates global hypokinesis. Definity  contrast agent was given IV to delineate the left   ventricular endocardial borders. The average left ventricular global longitudinal strain is 11.6 %. Strain was performed and the global longitudinal strain is abnormal. The left ventricular internal cavity size was normal in size. There is no left ventricular hypertrophy. Left ventricular diastolic parameters are consistent with Grade III diastolic dysfunction (restrictive). Right Ventricle: The right ventricular size is mildly enlarged. No increase in right ventricular wall thickness. Right ventricular systolic function is low normal. Left Atrium: Left atrial size was normal in size. Right Atrium: Right atrial size was normal in size. Pericardium: There is no evidence of pericardial effusion. Mitral Valve: The mitral valve is normal in structure. Mild mitral valve regurgitation. Tricuspid Valve: The tricuspid valve is normal in structure. Tricuspid valve regurgitation is mild. Aortic Valve: The aortic valve is calcified. Aortic valve regurgitation is trivial. Aortic valve sclerosis is present, with no evidence of aortic valve stenosis. Pulmonic Valve: The pulmonic valve was not well visualized. Pulmonic valve regurgitation is not visualized. Aorta: The ascending aorta was not well visualized. IAS/Shunts: No atrial level shunt detected by color flow Doppler. Additional Comments: 3D was performed not requiring image post processing on an independent workstation and was abnormal. A device lead is visualized.  LEFT VENTRICLE PLAX 2D LVIDd:         4.60 cm      Diastology LVIDs:         3.70 cm      LV e' medial:    4.68 cm/s LV PW:         1.00 cm      LV E/e' medial:  21.6 LV IVS:        0.80 cm      LV e' lateral:   7.18 cm/s LVOT diam:     2.00 cm  LV E/e' lateral: 14.1 LV SV:         58 LV SV Index:   25           2D Longitudinal Strain LVOT Area:     3.14 cm     2D Strain GLS (A4C):   11.9 %                             2D Strain GLS (A3C):   11.2 %                             2D Strain GLS (A2C):   11.6 % LV  Volumes (MOD)            2D Strain GLS Avg:     11.6 % LV vol d, MOD A2C: 93.8 ml LV vol d, MOD A4C: 130.0 ml LV vol s, MOD A2C: 58.5 ml LV vol s, MOD A4C: 75.9 ml  3D Volume EF: LV SV MOD A2C:     35.3 ml  3D EF:        41 % LV SV MOD A4C:     130.0 ml LV EDV:       121 ml LV SV MOD BP:      43.0 ml  LV ESV:       72 ml                             LV SV:        49 ml RIGHT VENTRICLE            IVC RV Basal diam:  2.50 cm    IVC diam: 0.80 cm RV S prime:     8.38 cm/s TAPSE (M-mode): 1.4 cm LEFT ATRIUM             Index        RIGHT ATRIUM          Index LA diam:        3.20 cm 1.40 cm/m   RA Area:     9.99 cm LA Vol (A2C):   33.5 ml 14.66 ml/m  RA Volume:   19.20 ml 8.40 ml/m LA Vol (A4C):   45.9 ml 20.08 ml/m LA Biplane Vol: 40.6 ml 17.76 ml/m  AORTIC VALVE LVOT Vmax:   90.38 cm/s LVOT Vmean:  59.975 cm/s LVOT VTI:    0.184 m  AORTA Ao Root diam: 3.60 cm Ao Asc diam:  3.60 cm MITRAL VALVE                TRICUSPID VALVE MV Area (PHT): 6.12 cm     TR Peak grad:   31.6 mmHg MV Decel Time: 124 msec     TR Vmax:        281.00 cm/s MV E velocity: 101.00 cm/s MV A velocity: 39.90 cm/s   SHUNTS MV E/A ratio:  2.53         Systemic VTI:  0.18 m                             Systemic Diam: 2.00 cm Dwayne JONETTA Lovelace MD Electronically signed by Cara JONETTA Lovelace MD Signature Date/Time: 11/22/2024/2:08:16 PM    Final     Scheduled Meds:  aspirin  EC  81 mg  Oral Daily   diclofenac  Sodium  2 g Topical QID   enoxaparin  (LOVENOX ) injection  40 mg Subcutaneous Q24H   metoprolol  succinate  12.5 mg Oral Q breakfast   midodrine   5 mg Oral BID   pantoprazole   40 mg Oral QPM   rivastigmine   1.5 mg Oral Daily   And   rivastigmine   3 mg Oral QHS   rosuvastatin   20 mg Oral Daily   senna  1 tablet Oral BID   Continuous Infusions:   LOS: 1 day   Norval Bar, MD  [facility]  11/24/2024, 10:40 AM   "

## 2024-11-24 NOTE — Progress Notes (Signed)
 Physical Therapy Treatment Patient Details Name: Alexander Kline. MRN: [id number] DOB: 07-30-1941 Today's Date: 11/24/2024   History of Present Illness Pt is an 84 y.o. 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    PT Comments  Patient able to complete all mobility tasks with less physical assist than on initial evaluation; now min/mod assist +2 for safety.  Noted delay in processing, initiation, sequencing and problem-solving of all tasks; consistent cuing/instruction from therapist required to complete all tasks.  Session limited by ongoing bowel incontinence (dep for hygiene); attempted use of BSC, but patient unable to cognitively understand task and generally unaware of bowel needs.   DC recs remain appropriate, as patient continues to require hands-on assist for all functional and mobility tasks.    If plan is discharge home, recommend the following: Two people to help with walking and/or transfers;A lot of help with bathing/dressing/bathroom;Assistance with cooking/housework;Direct supervision/assist for medications management;Supervision due to cognitive status;Direct supervision/assist for financial management;Assist for transportation;Help with stairs or ramp for entrance   Can travel by private vehicle     Yes  Equipment Recommendations       Recommendations for Other Services       Precautions / Restrictions Precautions Precautions: Fall Restrictions Weight Bearing Restrictions Per Provider Order: No     Mobility  Bed Mobility Overal bed mobility: Needs Assistance Bed Mobility: Supine to Sit   Sidelying to sit: Max assist       General bed mobility comments: difficulty with isolated movement of all extremities; poor dissociation of extremities  from trunk    Transfers Overall transfer level: Needs assistance Equipment used: Rolling walker (2 wheels) Transfers: Sit to/from Stand, Bed to chair/wheelchair/BSC Sit to Stand: Min assist, Mod assist, +2 safety/equipment Stand pivot transfers: Min assist, Mod assist, +2 safety/equipment         General transfer comment: prefers to place hands on RW (to assist with cognitive awareness of task requested); physical assist for lift off, anterior weight translation and initial standing balance. Once upright and accommodated to position, able to maintain static standing with RW, min assist +1-2    Ambulation/Gait               General Gait Details: deferred due to bowel incontinence/management   Stairs             Wheelchair Mobility     Tilt Bed    Modified Rankin (Stroke Patients Only)       Balance Overall balance assessment: Needs assistance Sitting-balance support: No upper extremity supported, Feet supported Sitting balance-Leahy Scale: Fair     Standing balance support: Bilateral upper extremity supported Standing balance-Leahy Scale: Poor Standing balance comment: +1-2 for safety with RW                            Communication Communication Communication: Impaired  Cognition Arousal: Alert Behavior During Therapy: Flat affect   PT - Cognitive impairments: History of cognitive impairments                       PT - Cognition Comments: Oriented to self only; notable delay in processing and response time Following commands: Impaired Following commands impaired: Follows one step commands with increased time, Only  follows one step commands consistently    Cueing Cueing Techniques: Verbal cues, Tactile cues  Exercises Other Exercises Other Exercises: Toilet transfer, SPT to [facility], with RW, min/mod assist +2 for safety.  Noted with bowel incontinence during transfers, but unable to toilet on [facility] (cognitively unaware of  location/task); generally unaware of bowel needs or incontinence.  Dep assist for hygiene and overall management.    General Comments        Pertinent Vitals/Pain Pain Assessment Pain Assessment: No/denies pain    Home Living Family/patient expects to be discharged to:: Assisted living                        Prior Function            PT Goals (current goals can now be found in the care plan section) Acute Rehab PT Goals Patient Stated Goal: To get stronger before returning home PT Goal Formulation: With family Time For Goal Achievement: 12/05/24 Potential to Achieve Goals: Good Progress towards PT goals: Progressing toward goals    Frequency    Min 2X/week      PT Plan      Co-evaluation              AM-PAC PT 6 Clicks Mobility   Outcome Measure  Help needed turning from your back to your side while in a flat bed without using bedrails?: A Little Help needed moving from lying on your back to sitting on the side of a flat bed without using bedrails?: A Little Help needed moving to and from a bed to a chair (including a wheelchair)?: A Lot Help needed standing up from a chair using your arms (e.g., wheelchair or bedside chair)?: A Lot Help needed to walk in hospital room?: A Lot Help needed climbing 3-5 steps with a railing? : Total 6 Click Score: 13    End of Session Equipment Utilized During Treatment: Gait belt Activity Tolerance: Patient tolerated treatment well Patient left: in bed;with call [place]/phone within reach;with bed alarm set;with family/visitor present Nurse Communication: Mobility status PT Visit Diagnosis: Unsteadiness on feet (R26.81);History of falling (Z91.81);Difficulty in walking, not elsewhere classified (R26.2);Muscle weakness (generalized) (M62.81);Pain     Time: 8940-8875 PT Time Calculation (min) (ACUTE ONLY): 25 min  Charges:    $Therapeutic Activity: 23-37 mins PT General Charges $$ ACUTE PT VISIT: 1  Visit                     [name] H. Delores, PT, DPT, NCS 11/24/24, 11:37 AM [phone]

## 2024-11-25 MED ORDER — METOPROLOL SUCCINATE ER 25 MG PO TB24: 12.5000 mg | ORAL_TABLET | Freq: Every day | ORAL | Status: AC

## 2024-11-25 MED ORDER — DICLOFENAC SODIUM 1 % EX GEL: 2.0000 g | Freq: Four times a day (QID) | CUTANEOUS | Status: AC

## 2024-11-25 NOTE — Progress Notes (Signed)
 Occupational Therapy Treatment Patient Details Name: Jyden Kromer. MRN: [id number] DOB: 1941/02/09 Today's Date: 11/25/2024   History of present illness Pt is an 84 y.o. 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   OT comments  Upon entering the room, pt supine in bed with supportive daughter present in room. Pt needing max A with log roll for comfort secondary to chronic back pain. Pt sits on EOB with supervision- CGA for static sitting balance. Pt stands from EOB with mod A and use of RW and takes several steps to recliner chair with min A. Pt takes seated rest break. He stands from lower chair surface with max A and ambulates 8' with RW and min A x 2 reps needing rest break between each bout of ambulation. Pt declines toileting needs. He remains seated in recliner chair at end of session. Call [place] and all needed items within reach.       If plan is discharge home, recommend the following:  A lot of help with walking and/or transfers;A lot of help with bathing/dressing/bathroom;Assistance with cooking/housework;Direct supervision/assist for medications management;Direct supervision/assist for financial management;Assist for transportation;Help with stairs or ramp for entrance;Supervision due to cognitive status         Precautions / Restrictions Precautions Precautions: Fall       Mobility Bed Mobility Overal bed mobility: Needs Assistance Bed Mobility: Sidelying to Sit   Sidelying to sit: Max assist            Transfers Overall transfer level: Needs assistance Equipment used: Rolling walker (2 wheels) Transfers: Sit to/from Stand, Bed to chair/wheelchair/BSC Sit to Stand: Max assist, Mod assist Stand pivot transfers: Min assist                Balance Overall balance assessment: Needs assistance Sitting-balance support: No upper extremity supported, Feet supported Sitting balance-Leahy Scale: Fair     Standing balance support: Bilateral upper extremity supported, Reliant on assistive device for balance Standing balance-Leahy Scale: Poor                             ADL either performed or assessed with clinical judgement   ADL Overall ADL's : Needs assistance/impaired                         Toilet Transfer: Moderate assistance;Stand-pivot;Rolling walker (2 wheels) Toilet Transfer Details (indicate cue type and reason): simluated                Extremity/Trunk Assessment Upper Extremity Assessment Upper Extremity Assessment: Generalized weakness   Lower Extremity Assessment Lower Extremity Assessment: Generalized weakness        Vision Patient Visual Report: No change from baseline           Communication Communication Communication: Impaired Factors Affecting Communication: Hearing impaired   Cognition   Behavior During Therapy: Flat affect Cognition: History of cognitive impairments             OT - Cognition Comments: 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                 Following commands: Impaired Following commands impaired: Follows one step commands with increased time, Only follows one step commands consistently  Cueing   Cueing Techniques: Verbal cues, Tactile cues             Pertinent Vitals/ Pain       Pain Assessment Pain Assessment: No/denies pain         Frequency  Min 2X/week        Progress Toward Goals  OT Goals(current goals can now be found in the care plan section)  Progress towards OT goals: Progressing toward goals      AM-PAC OT 6 Clicks Daily Activity     Outcome Measure   Help from another person eating meals?: None Help from another person taking care of personal grooming?: A Little Help from another person  toileting, which includes using toliet, bedpan, or urinal?: A Lot Help from another person bathing (including washing, rinsing, drying)?: A Lot Help from another person to put on and taking off regular upper body clothing?: A Little Help from another person to put on and taking off regular lower body clothing?: A Lot 6 Click Score: 16    End of Session Equipment Utilized During Treatment: Rolling walker (2 wheels)  OT Visit Diagnosis: Unsteadiness on feet (R26.81);Repeated falls (R29.6);Muscle weakness (generalized) (M62.81)   Activity Tolerance Patient tolerated treatment well;Patient limited by fatigue   Patient Left with call [place]/phone within reach;with family/visitor present;in chair;with chair alarm set   Nurse Communication          Time: [id number] OT Time Calculation (min): 18 min  Charges: OT General Charges $OT Visit: 1 Visit OT Treatments $Therapeutic Activity: 8-22 mins  Izetta Claude, MS, OTR/L , CBIS ascom [phone]  11/25/24, 10:28 AM

## 2024-11-25 NOTE — TOC Progression Note (Signed)
 Transition of Care (TOC) - Progression Note    Patient Details  Name: Alexander Kline. MRN: [id number] Date of Birth: 06/01/1941  Transition of Care [facility]) CM/SW Contact  Alfonso Rummer, LCSW Phone Number: 11/25/2024, 3:10 PM  Clinical Narrative:     Pt unable to transition to [facility] under traditional medicare due to not have qualifying 3 night inpatient hospital stay. Pt daughter advised of requirements and agree to private pay for admission into [facility]. Admission documents printed and provided to Melissa Audino pt daughter. [facility] reports pt room will be available 11/26/24                    Expected Discharge Plan and Services         Expected Discharge Date: 11/24/24                                     Social Drivers of Health (SDOH) Interventions SDOH Screenings   Food Insecurity: No Food Insecurity (11/24/2024)  Housing: Low Risk (11/24/2024)  Transportation Needs: No Transportation Needs (11/24/2024)  Utilities: Not At Risk (11/24/2024)  Financial Resource Strain: Low Risk  (09/11/2024)   Received from [facility]  Social Connections: Moderately Isolated (11/24/2024)  Tobacco Use: Low Risk (11/22/2024)    Readmission Risk Interventions     No data to display

## 2024-11-25 NOTE — Plan of Care (Signed)
" °  Problem: Education: Goal: Knowledge of General Education information will improve Description: Including pain rating scale, medication(s)/side effects and non-pharmacologic comfort measures Outcome: Progressing   Problem: Health Behavior/Discharge Planning: Goal: Ability to manage health-related needs will improve Outcome: Progressing   Problem: Health Behavior/Discharge Planning: Goal: Ability to manage health-related needs will improve Outcome: Progressing   Problem: Clinical Measurements: Goal: Ability to maintain clinical measurements within normal limits will improve Outcome: Progressing Goal: Will remain free from infection Outcome: Progressing Goal: Diagnostic test results will improve Outcome: Progressing Goal: Respiratory complications will improve Outcome: Progressing Goal: Cardiovascular complication will be avoided Outcome: Progressing   Problem: Activity: Goal: Risk for activity intolerance will decrease Outcome: Progressing   Problem: Nutrition: Goal: Adequate nutrition will be maintained Outcome: Progressing   Problem: Elimination: Goal: Will not experience complications related to bowel motility Outcome: Progressing Goal: Will not experience complications related to urinary retention Outcome: Progressing   Problem: Coping: Goal: Level of anxiety will decrease Outcome: Progressing   Problem: Pain Managment: Goal: General experience of comfort will improve and/or be controlled Outcome: Progressing   Problem: Skin Integrity: Goal: Risk for impaired skin integrity will decrease Outcome: Progressing   Problem: Safety: Goal: Ability to remain free from injury will improve Outcome: Progressing   Problem: Skin Integrity: Goal: Risk for impaired skin integrity will decrease Outcome: Progressing   "

## 2024-11-26 MED ORDER — MIDODRINE HCL 5 MG PO TABS: 5.0000 mg | ORAL_TABLET | Freq: Three times a day (TID) | ORAL | Status: AC | PRN

## 2024-11-26 MED ORDER — CHLORPROMAZINE HCL 25 MG PO TABS: 25.0000 mg | ORAL_TABLET | Freq: Three times a day (TID) | ORAL | Status: AC | PRN

## 2024-11-26 NOTE — Plan of Care (Signed)

## 2024-11-26 NOTE — TOC Transition Note (Signed)
 Transition of Care [facility]) - Discharge Note   Patient Details  Name: Alexander Kline. MRN: [id number] Date of Birth: June 21, 1941  Transition of Care [facility]) CM/SW Contact:  Shasta DELENA Daring, RN Phone Number: 11/26/2024, 11:50 AM   Clinical Narrative:    Patient / family has discharge orders. Patient has bed at [facility].  Confirmed transportation with [facility]  Notified patient daughter, Melissa.  No additional TOC needs.  RNCM signing off.   Final next level of care: Skilled Nursing Facility Barriers to Discharge: Barriers Resolved   Patient Goals and CMS Choice     Choice offered to / list presented to : Adult Children      Discharge Placement              Patient chooses bed at: [facility] Patient to be transferred to facility by: [facility] Name of family member notified: Melissa Patient and family notified of of transfer: 11/26/24  Discharge Plan and Services Additional resources added to the After Visit Summary for                                       Social Drivers of Health (SDOH) Interventions SDOH Screenings   Food Insecurity: No Food Insecurity (11/24/2024)  Housing: Low Risk (11/24/2024)  Transportation Needs: No Transportation Needs (11/24/2024)  Utilities: Not At Risk (11/24/2024)  Financial Resource Strain: Low Risk  (09/11/2024)   Received from [facility]  Social Connections: Moderately Isolated (11/24/2024)  Tobacco Use: Low Risk (11/22/2024)     Readmission Risk Interventions     No data to display

## 2024-11-30 ENCOUNTER — Emergency Department

## 2024-11-30 ENCOUNTER — Emergency Department
Admission: EM | Admit: 2024-11-30 | Discharge: 2024-11-30 | Disposition: A | Discharge status: Home / Self Care | Attending: Emergency Medicine | Admitting: Emergency Medicine

## 2024-11-30 DIAGNOSIS — S0181XA Laceration without foreign body of other part of head, initial encounter: Secondary | ICD-10-CM | POA: Diagnosis not present

## 2024-11-30 DIAGNOSIS — F0390 Unspecified dementia without behavioral disturbance: Secondary | ICD-10-CM | POA: Insufficient documentation

## 2024-11-30 DIAGNOSIS — I5089 Other heart failure: Secondary | ICD-10-CM | POA: Insufficient documentation

## 2024-11-30 DIAGNOSIS — S0993XA Unspecified injury of face, initial encounter: Secondary | ICD-10-CM | POA: Diagnosis present

## 2024-11-30 DIAGNOSIS — S0121XA Laceration without foreign body of nose, initial encounter: Secondary | ICD-10-CM | POA: Diagnosis not present

## 2024-11-30 DIAGNOSIS — I110 Hypertensive heart disease with heart failure: Secondary | ICD-10-CM | POA: Insufficient documentation

## 2024-11-30 DIAGNOSIS — W1839XA Other fall on same level, initial encounter: Secondary | ICD-10-CM | POA: Insufficient documentation

## 2024-11-30 DIAGNOSIS — Z7901 Long term (current) use of anticoagulants: Secondary | ICD-10-CM | POA: Insufficient documentation

## 2024-11-30 DIAGNOSIS — W19XXXA Unspecified fall, initial encounter: Secondary | ICD-10-CM

## 2024-11-30 DIAGNOSIS — Z8673 Personal history of transient ischemic attack (TIA), and cerebral infarction without residual deficits: Secondary | ICD-10-CM | POA: Diagnosis not present

## 2024-11-30 DIAGNOSIS — I2510 Atherosclerotic heart disease of native coronary artery without angina pectoris: Secondary | ICD-10-CM | POA: Insufficient documentation

## 2024-11-30 LAB — CBC WITH DIFFERENTIAL/PLATELET
Abs Immature Granulocytes: 0.06 10*3/uL (ref 0.00–0.07)
Basophils Absolute: 0 10*3/uL (ref 0.0–0.1)
Basophils Relative: 0 %
Eosinophils Absolute: 0.1 10*3/uL (ref 0.0–0.5)
Eosinophils Relative: 0 %
HCT: 42.9 % (ref 39.0–52.0)
Hemoglobin: 14.5 g/dL (ref 13.0–17.0)
Immature Granulocytes: 1 %
Lymphocytes Relative: 14 %
Lymphs Abs: 1.7 10*3/uL (ref 0.7–4.0)
MCH: 30.8 pg (ref 26.0–34.0)
MCHC: 33.8 g/dL (ref 30.0–36.0)
MCV: 91.1 fL (ref 80.0–100.0)
Monocytes Absolute: 0.8 10*3/uL (ref 0.1–1.0)
Monocytes Relative: 7 %
Neutro Abs: 9.3 10*3/uL — ABNORMAL HIGH (ref 1.7–7.7)
Neutrophils Relative %: 78 %
Platelets: 225 10*3/uL (ref 150–400)
RBC: 4.71 MIL/uL (ref 4.22–5.81)
RDW: 13.5 % (ref 11.5–15.5)
WBC: 11.9 10*3/uL — ABNORMAL HIGH (ref 4.0–10.5)
nRBC: 0 % (ref 0.0–0.2)

## 2024-11-30 LAB — COMPREHENSIVE METABOLIC PANEL WITH GFR
ALT: 31 U/L (ref 0–44)
AST: 34 U/L (ref 15–41)
Albumin: 3.9 g/dL (ref 3.5–5.0)
Alkaline Phosphatase: 134 U/L — ABNORMAL HIGH (ref 38–126)
Anion gap: 13 (ref 5–15)
BUN: 23 mg/dL (ref 8–23)
CO2: 26 mmol/L (ref 22–32)
Calcium: 9.9 mg/dL (ref 8.9–10.3)
Chloride: 102 mmol/L (ref 98–111)
Creatinine, Ser: 1.14 mg/dL (ref 0.61–1.24)
GFR, Estimated: >60 mL/min
Glucose, Bld: 130 mg/dL — ABNORMAL HIGH (ref 70–99)
Potassium: 4.6 mmol/L (ref 3.5–5.1)
Sodium: 140 mmol/L (ref 135–145)
Total Bilirubin: 0.6 mg/dL (ref 0.0–1.2)
Total Protein: 7.3 g/dL (ref 6.5–8.1)

## 2024-11-30 LAB — TROPONIN T, HIGH SENSITIVITY
Troponin T High Sensitivity: 28 ng/L — ABNORMAL HIGH (ref 0–19)
Troponin T High Sensitivity: 33 ng/L — ABNORMAL HIGH (ref 0–19)

## 2024-11-30 LAB — PRO BRAIN NATRIURETIC PEPTIDE: Pro Brain Natriuretic Peptide: 170 pg/mL

## 2024-11-30 MED ORDER — METOPROLOL SUCCINATE ER 25 MG PO TB24: 12.5000 mg | ORAL_TABLET | Freq: Every day | ORAL | Status: DC

## 2024-11-30 MED ORDER — MIDODRINE HCL 5 MG PO TABS: 5.0000 mg | ORAL_TABLET | Freq: Three times a day (TID) | ORAL | Status: DC | PRN

## 2024-11-30 MED ORDER — RIVASTIGMINE TARTRATE 3 MG PO CAPS
4.5000 mg | ORAL_CAPSULE | Freq: Two times a day (BID) | ORAL | Status: DC
  Administered 2024-11-30: 4.5 mg via ORAL
  Filled 2024-11-30 (×2): qty 1

## 2024-11-30 MED ORDER — STARCH (THICKENING) PO POWD: ORAL | Status: DC | PRN

## 2024-11-30 MED ORDER — ACETAMINOPHEN 500 MG PO TABS
1000.0000 mg | ORAL_TABLET | Freq: Once | ORAL | Status: AC
  Administered 2024-11-30: 1000 mg via ORAL
  Filled 2024-11-30: qty 2

## 2024-11-30 MED ORDER — ROSUVASTATIN CALCIUM 20 MG PO TABS
20.0000 mg | ORAL_TABLET | Freq: Every day | ORAL | Status: DC
  Administered 2024-11-30: 20 mg via ORAL
  Filled 2024-11-30: qty 1

## 2024-11-30 MED ORDER — LIDOCAINE HCL (PF) 1 % IJ SOLN
5.0000 mL | Freq: Once | INTRAMUSCULAR | Status: AC
  Administered 2024-11-30: 5 mL
  Filled 2024-11-30: qty 5

## 2024-11-30 MED ORDER — ADULT MULTIVITAMIN W/MINERALS CH: 1.0000 | ORAL_TABLET | Freq: Every evening | ORAL | Status: DC

## 2024-11-30 MED ORDER — VITAMIN D 25 MCG (1000 UNIT) PO TABS: 3000.0000 [IU] | ORAL_TABLET | Freq: Every day | ORAL | Status: DC

## 2024-11-30 MED ORDER — ASPIRIN 81 MG PO TBEC: 81.0000 mg | DELAYED_RELEASE_TABLET | Freq: Every day | ORAL | Status: DC

## 2024-11-30 MED ORDER — CHLORPROMAZINE HCL 25 MG PO TABS: 25.0000 mg | ORAL_TABLET | Freq: Three times a day (TID) | ORAL | Status: DC | PRN

## 2024-11-30 MED ORDER — COQ-10 100 MG PO CAPS: 100.0000 mg | ORAL_CAPSULE | Freq: Every evening | ORAL | Status: DC

## 2024-11-30 MED ORDER — FERROUS GLUCONATE 324 (38 FE) MG PO TABS: 324.0000 mg | ORAL_TABLET | Freq: Every day | ORAL | Status: DC

## 2024-11-30 NOTE — ED Notes (Signed)
 [facility] called spoke with Alexander Kline regarding Transfer to [facility]

## 2024-11-30 NOTE — ED Notes (Signed)
 Discharge instructions reviewed with patient. Patient questions answered and opportunity for education reviewed. Patient voices understanding of discharge instructions with no further question. Report called to facility and discharge instructions reviewed.

## 2024-11-30 NOTE — Discharge Instructions (Addendum)
" °  Do not blow the nose and follow-up with ENT in 6 to 10 days and return to the ER if you develop worsening symptoms or any other concerns.  Otherwise CT and blood work was re-assuring.  Sutures will need to be removed in 5 days.      IMPRESSION: 1. No acute cardiopulmonary abnormality.   IMPRESSION: 1. New minimally displaced nasal bone fractures bilaterally. 2. Right forehead laceration.  IMPRESSION: 1. No evidence of fracture or acute traumatic injury. 2. Mild straightening of the normal cervical lordosis. 3. Mild diffuse chronic degenerative disc disease and moderate facet arthrosis throughout the cervical spine  IMPRESSION: 1. No acute osseous abnormality of the pelvis. 2. Status post right hip hemiarthroplasty without acute comp "

## 2024-11-30 NOTE — ED Notes (Signed)
 Attempted to interrogate pacemaker multiple times. Report will not cross over to [employer]. Ernest, MD made aware

## 2024-11-30 NOTE — ED Provider Notes (Signed)
 "  [facility] Provider Note    Event Date/Time   First MD Initiated Contact with Patient 11/30/24 [phone]     (approximate)   History   Fall and Head Laceration   HPI  Alexander Kline. is a 84 y.o. 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  I reviewed the discharge note from 1/24 until 1/28 2026 where patient was admitted.  Patient was found to have an elevated troponin and had an echo with a EF of 35 to 40% and they recommended to follow-up outpatient with cardiology for further workup.  They recommended torsemide  as needed but on his discharge this was not actually included.  According to patient's daughter they are concerned about a possible autonomic disorder and are awaiting further workup with neurology outpatient.  She she thinks that he had just rolled out of bed but patient himself does not know what happened.  The bed was situated on the ground so was not a high height that he fell.  He denies any current pain other than when I push on the laceration.  He is able to move all extremities well no abdominal pain, no chest pain.  Daughter does report that yesterday he maybe seemed a little bit more short of breath but it seemed of gotten better with a breathing treatment.  They report that they have not been on any Lasix  or torsemide  recently but they have not noticed any increased swelling in his legs or any other concerns in regards to this.  She reports that he seems to be at baseline compared to a few days ago when he was just discharged.  He has been ambulatory with physical therapy over the past few days but he is very unsteady and has difficulty getting up with the nursing staff.  Physical Exam   Triage Vital Signs: ED Triage Vitals  Encounter Vitals Group     BP 11/30/24 0627 139/76     Girls Systolic BP Percentile --      Girls Diastolic BP Percentile  --      Boys Systolic BP Percentile --      Boys Diastolic BP Percentile --      Pulse Rate 11/30/24 0627 89     Resp 11/30/24 0627 20     Temp 11/30/24 0627 98 F (36.7 C)     Temp Source 11/30/24 0627 Oral     SpO2 11/30/24 0627 99 %     Weight 11/30/24 0628 186 lb 8.2 oz (84.6 kg)     Height 11/30/24 0628 5' 11 (1.803 m)     Head Circumference --      Peak Flow --      Pain Score 11/30/24 0628 0     Pain Loc --      Pain Education --      Exclude from Growth Chart --     Most recent vital signs: Vitals:   11/30/24 0627  BP: 139/76  Pulse: 89  Resp: 20  Temp: 98 F (36.7 C)  SpO2: 99%     General: Awake, no distress.  CV:  Good peripheral perfusion.  Resp:  Normal effort.  Abd:  No distention.  Other:  Equal strength in arms and legs sensations intact no obvious cranial nerve deficits.  Abdomen soft and nontender chest wall soft and nontender.  Laceration above the nose as  well as a deeper laceration above the right eyebrow.  No septal hematoma.   ED Results / Procedures / Treatments   Labs (all labs ordered are listed, but only abnormal results are displayed) Labs Reviewed  CBC WITH DIFFERENTIAL/PLATELET - Abnormal; Notable for the following components:      Result Value   WBC 11.9 (*)    Neutro Abs 9.3 (*)    All other components within normal limits  COMPREHENSIVE METABOLIC PANEL WITH GFR  URINALYSIS, ROUTINE W REFLEX MICROSCOPIC  PRO BRAIN NATRIURETIC PEPTIDE  TROPONIN T, HIGH SENSITIVITY     EKG  My interpretation of EKG:  Atrially paced rhythm with a rate of 88 without any ST elevation or T wave inversions, normal intervals  RADIOLOGY I have reviewed the xray personally and interpreted no evidence of any edema   PROCEDURES:  Critical Care performed: No  .Laceration Repair  Date/Time: 11/30/2024 8:48 AM  Performed by: Ernest Ronal BRAVO, MD Authorized by: Ernest Ronal BRAVO, MD   Consent:    Consent obtained:  Verbal   Consent given by:   Guardian   Risks discussed:  Infection, pain, poor cosmetic result and poor wound healing   Alternatives discussed:  No treatment Universal protocol:    Patient identity confirmed:  Verbally with patient Anesthesia:    Anesthesia method:  Local infiltration   Local anesthetic:  Lidocaine  1% w/o epi Laceration details:    Location:  Face   Face location:  R eyebrow   Length (cm):  3 Exploration:    Limited defect created (wound extended): no   Treatment:    Area cleansed with:  Saline and povidone-iodine   Irrigation method:  Syringe Skin repair:    Repair method:  Sutures   Suture size:  6-0   Suture material:  Prolene   Number of sutures:  5 Approximation:    Approximation:  Close Repair type:    Repair type:  Simple Post-procedure details:    Procedure completion:  Tolerated well, no immediate complications .Laceration Repair  Date/Time: 11/30/2024 8:49 AM  Performed by: Ernest Ronal BRAVO, MD Authorized by: Ernest Ronal BRAVO, MD   Consent:    Consent obtained:  Verbal   Risks discussed:  Infection   Alternatives discussed:  No treatment Anesthesia:    Anesthesia method:  None Laceration details:    Location: nose.   Length (cm):  0.5 Exploration:    Limited defect created (wound extended): no     Contaminated: no   Treatment:    Area cleansed with:  Saline   Irrigation volume:  20cc   Debridement:  None Skin repair:    Repair method:  Tissue adhesive Approximation:    Approximation:  Close Post-procedure details:    Procedure completion:  Tolerated well, no immediate complications .1-3 Lead EKG Interpretation  Performed by: Ernest Ronal BRAVO, MD Authorized by: Ernest Ronal BRAVO, MD     Interpretation: normal     ECG rate:  80   ECG rate assessment: normal     Rhythm: paced     Ectopy: none     Conduction: normal      MEDICATIONS ORDERED IN ED: Medications  acetaminophen  (TYLENOL ) tablet 1,000 mg (1,000 mg Oral Given 11/30/24 0756)  lidocaine  (PF) (XYLOCAINE ) 1 %  injection 5 mL (5 mLs Other Given 11/30/24 0756)     IMPRESSION / MDM / ASSESSMENT AND PLAN / ED COURSE  I reviewed the triage vital signs and the nursing notes.   Patient's  presentation is most consistent with acute presentation with potential threat to life or bodily function.   Patient comes in with a fall.  We discussed tetanus shot but family declined.  I think he has had 1 in the last 10 years.  CT imaging ordered evaluate for intracranial hemorrhage, cervical fracture, facial fracture.  Given reported some shortness of breath possibly yesterday will get repeat EKG, troponin, BNP, chest x-ray to evaluate for ACS, CHF.  Patient has no obvious swelling to the legs no evidence of DVT on examination.  His vitals here are reassuring with no hypoxia.  Discussed with patient's family members and if workup is reassuring she would feel comfortable with patient returning back to facility given there is been no acute change from when he was just discharged and they have a plan to follow-up with neurology outpatient.   IMPRESSION: 1. No acute cardiopulmonary abnormality.   IMPRESSION: 1. New minimally displaced nasal bone fractures bilaterally. 2. Right forehead laceration.  IMPRESSION: 1. No evidence of fracture or acute traumatic injury. 2. Mild straightening of the normal cervical lordosis. 3. Mild diffuse chronic degenerative disc disease and moderate facet arthrosis throughout the cervical spine  IMPRESSION: 1. No acute osseous abnormality of the pelvis. 2. Status post right hip hemiarthroplasty without acute complication. 3. Moderate chronic degenerative disc disease at L4-5.  My suspicion for occult hip fracture is low given patient's able to fully lift up both hips.  We discussed the nasal bone fractures and need for follow-up with ENT as needed.  Laceration was repaired.  We have attempted to interrogate patient's pacemaker multiple times but issues with it getting transferred over.   Discussed with patient's family member who states that she is not concerned about syncope and thinks it was a mechanical fall given pacemaker appears to be functioning on EKG she is okay with holding off on interrogation and will follow-up with her primary care doctor for this.  10:47 AM discussed with patient daughter and updated on the results they feel comfortable with discharge back to facility after troponin results.  They deny any urinary symptoms okay with holding off on urine.  They did not want me to prescribe the torsemide  as needed given his BNP was reassuring and chest x-ray was negative.  Trop is flat- and pt had no cp-- will dc back to faiclity.   The patient is on the cardiac monitor to evaluate for evidence of arrhythmia and/or significant heart rate changes.      FINAL CLINICAL IMPRESSION(S) / ED DIAGNOSES   Final diagnoses:  Fall, initial encounter  Laceration of forehead, initial encounter     Rx / DC Orders   ED Discharge Orders     None        Note:  This document was prepared using Dragon voice recognition software and may include unintentional dictation errors.   Ernest Ronal BRAVO, MD 11/30/24 1126  "

## 2024-11-30 NOTE — ED Triage Notes (Signed)
 Pt here via EMS from [facility] for unwitnessed fall resulting in laceration to right forehead and bridge of nose. Pt has dementia and is not reliable about condition and past medical hx. EMS notes no LOC, but pt unable to verify. No meds PTA. Pt appears well. Hx of HTN, new onset CHF, pre diabetes, and an autonomic dysfunction.
